# Patient Record
Sex: Female | Born: 1950 | Race: Black or African American | Hispanic: No | Marital: Single | Smoking: Never smoker
Health system: Southern US, Community
[De-identification: ages and names within clinical notes are randomized; demographics above are authoritative.]

## PROBLEM LIST (undated history)

## (undated) DIAGNOSIS — F419 Anxiety disorder, unspecified: Secondary | ICD-10-CM

## (undated) DIAGNOSIS — H269 Unspecified cataract: Secondary | ICD-10-CM

## (undated) DIAGNOSIS — R011 Cardiac murmur, unspecified: Secondary | ICD-10-CM

## (undated) DIAGNOSIS — I639 Cerebral infarction, unspecified: Secondary | ICD-10-CM

## (undated) DIAGNOSIS — E785 Hyperlipidemia, unspecified: Secondary | ICD-10-CM

## (undated) DIAGNOSIS — M109 Gout, unspecified: Secondary | ICD-10-CM

## (undated) DIAGNOSIS — I5032 Chronic diastolic (congestive) heart failure: Secondary | ICD-10-CM

## (undated) DIAGNOSIS — N189 Chronic kidney disease, unspecified: Secondary | ICD-10-CM

## (undated) DIAGNOSIS — Z9889 Other specified postprocedural states: Secondary | ICD-10-CM

## (undated) DIAGNOSIS — R112 Nausea with vomiting, unspecified: Secondary | ICD-10-CM

## (undated) DIAGNOSIS — M1990 Unspecified osteoarthritis, unspecified site: Secondary | ICD-10-CM

## (undated) DIAGNOSIS — G629 Polyneuropathy, unspecified: Secondary | ICD-10-CM

## (undated) DIAGNOSIS — T7840XA Allergy, unspecified, initial encounter: Secondary | ICD-10-CM

## (undated) DIAGNOSIS — I10 Essential (primary) hypertension: Secondary | ICD-10-CM

## (undated) HISTORY — DX: Chronic kidney disease, unspecified: N18.9

## (undated) HISTORY — PX: FRACTURE SURGERY: SHX138

## (undated) HISTORY — PX: CATARACT EXTRACTION, BILATERAL: SHX1313

## (undated) HISTORY — DX: Unspecified osteoarthritis, unspecified site: M19.90

## (undated) HISTORY — DX: Cerebral infarction, unspecified: I63.9

## (undated) HISTORY — PX: HEMORRHOID SURGERY: SHX153

## (undated) HISTORY — DX: Essential (primary) hypertension: I10

## (undated) HISTORY — PX: EYE SURGERY: SHX253

## (undated) HISTORY — DX: Allergy, unspecified, initial encounter: T78.40XA

## (undated) HISTORY — DX: Polyneuropathy, unspecified: G62.9

## (undated) HISTORY — PX: SPINE SURGERY: SHX786

## (undated) HISTORY — DX: Anxiety disorder, unspecified: F41.9

## (undated) HISTORY — DX: Unspecified cataract: H26.9

## (undated) HISTORY — PX: APPENDECTOMY: SHX54

## (undated) HISTORY — DX: Hyperlipidemia, unspecified: E78.5

## (undated) HISTORY — DX: Cardiac murmur, unspecified: R01.1

## (undated) HISTORY — PX: NECK SURGERY: SHX720

## (undated) HISTORY — PX: TUBAL LIGATION: SHX77

---

## 1998-11-15 ENCOUNTER — Other Ambulatory Visit: Admission: RE | Admit: 1998-11-15 | Discharge: 1998-11-15 | Payer: Self-pay | Admitting: Obstetrics & Gynecology

## 1999-06-29 ENCOUNTER — Emergency Department (HOSPITAL_COMMUNITY): Admission: EM | Admit: 1999-06-29 | Discharge: 1999-06-29 | Payer: Self-pay | Admitting: Emergency Medicine

## 1999-09-26 ENCOUNTER — Encounter: Admission: RE | Admit: 1999-09-26 | Discharge: 1999-09-26 | Payer: Self-pay | Admitting: Neurosurgery

## 1999-10-23 ENCOUNTER — Ambulatory Visit (HOSPITAL_COMMUNITY): Admission: RE | Admit: 1999-10-23 | Discharge: 1999-10-23 | Payer: Self-pay | Admitting: Neurosurgery

## 1999-10-23 ENCOUNTER — Encounter: Payer: Self-pay | Admitting: Neurosurgery

## 2000-02-09 ENCOUNTER — Emergency Department (HOSPITAL_COMMUNITY): Admission: EM | Admit: 2000-02-09 | Discharge: 2000-02-09 | Payer: Self-pay | Admitting: Emergency Medicine

## 2000-02-11 ENCOUNTER — Encounter: Admission: RE | Admit: 2000-02-11 | Discharge: 2000-05-11 | Payer: Self-pay | Admitting: Internal Medicine

## 2000-09-04 ENCOUNTER — Emergency Department (HOSPITAL_COMMUNITY): Admission: EM | Admit: 2000-09-04 | Discharge: 2000-09-04 | Payer: Self-pay | Admitting: *Deleted

## 2001-04-20 ENCOUNTER — Ambulatory Visit (HOSPITAL_COMMUNITY): Admission: RE | Admit: 2001-04-20 | Discharge: 2001-04-21 | Payer: Self-pay | Admitting: Ophthalmology

## 2001-04-20 ENCOUNTER — Encounter: Payer: Self-pay | Admitting: Ophthalmology

## 2001-09-08 ENCOUNTER — Ambulatory Visit (HOSPITAL_BASED_OUTPATIENT_CLINIC_OR_DEPARTMENT_OTHER): Admission: RE | Admit: 2001-09-08 | Discharge: 2001-09-08 | Payer: Self-pay | Admitting: Orthopedic Surgery

## 2002-01-27 ENCOUNTER — Encounter: Payer: Self-pay | Admitting: Internal Medicine

## 2002-04-29 ENCOUNTER — Encounter: Admission: RE | Admit: 2002-04-29 | Discharge: 2002-04-29 | Payer: Self-pay | Admitting: Internal Medicine

## 2002-05-13 ENCOUNTER — Encounter: Admission: RE | Admit: 2002-05-13 | Discharge: 2002-05-13 | Payer: Self-pay | Admitting: Internal Medicine

## 2002-05-27 ENCOUNTER — Encounter: Admission: RE | Admit: 2002-05-27 | Discharge: 2002-05-27 | Payer: Self-pay | Admitting: Internal Medicine

## 2002-07-02 ENCOUNTER — Encounter: Admission: RE | Admit: 2002-07-02 | Discharge: 2002-07-02 | Payer: Self-pay | Admitting: Internal Medicine

## 2002-07-02 ENCOUNTER — Ambulatory Visit (HOSPITAL_COMMUNITY): Admission: RE | Admit: 2002-07-02 | Discharge: 2002-07-02 | Payer: Self-pay | Admitting: Internal Medicine

## 2002-07-02 ENCOUNTER — Encounter: Payer: Self-pay | Admitting: Internal Medicine

## 2002-07-14 ENCOUNTER — Ambulatory Visit (HOSPITAL_COMMUNITY): Admission: RE | Admit: 2002-07-14 | Discharge: 2002-07-14 | Payer: Self-pay | Admitting: Internal Medicine

## 2002-07-14 ENCOUNTER — Encounter: Payer: Self-pay | Admitting: Cardiovascular Disease

## 2002-09-08 ENCOUNTER — Encounter: Payer: Self-pay | Admitting: Internal Medicine

## 2002-09-22 ENCOUNTER — Encounter: Payer: Self-pay | Admitting: Internal Medicine

## 2002-12-31 ENCOUNTER — Emergency Department (HOSPITAL_COMMUNITY): Admission: EM | Admit: 2002-12-31 | Discharge: 2002-12-31 | Payer: Self-pay | Admitting: Emergency Medicine

## 2002-12-31 ENCOUNTER — Encounter: Payer: Self-pay | Admitting: Emergency Medicine

## 2004-05-28 ENCOUNTER — Encounter: Payer: Self-pay | Admitting: Internal Medicine

## 2004-05-28 ENCOUNTER — Other Ambulatory Visit: Admission: RE | Admit: 2004-05-28 | Discharge: 2004-05-28 | Payer: Self-pay | Admitting: Internal Medicine

## 2004-07-09 ENCOUNTER — Emergency Department (HOSPITAL_COMMUNITY): Admission: EM | Admit: 2004-07-09 | Discharge: 2004-07-09 | Payer: Self-pay | Admitting: Family Medicine

## 2004-11-07 ENCOUNTER — Ambulatory Visit: Payer: Self-pay | Admitting: Internal Medicine

## 2004-12-29 ENCOUNTER — Emergency Department (HOSPITAL_COMMUNITY): Admission: EM | Admit: 2004-12-29 | Discharge: 2004-12-30 | Payer: Self-pay | Admitting: Emergency Medicine

## 2004-12-31 ENCOUNTER — Ambulatory Visit: Payer: Self-pay | Admitting: Internal Medicine

## 2005-01-11 ENCOUNTER — Ambulatory Visit: Payer: Self-pay | Admitting: Internal Medicine

## 2005-02-05 ENCOUNTER — Ambulatory Visit: Payer: Self-pay | Admitting: Internal Medicine

## 2005-05-06 ENCOUNTER — Ambulatory Visit: Payer: Self-pay | Admitting: Internal Medicine

## 2005-06-03 ENCOUNTER — Ambulatory Visit: Payer: Self-pay | Admitting: Internal Medicine

## 2005-07-25 ENCOUNTER — Ambulatory Visit: Payer: Self-pay | Admitting: Internal Medicine

## 2005-08-06 ENCOUNTER — Ambulatory Visit: Payer: Self-pay | Admitting: Internal Medicine

## 2005-10-28 ENCOUNTER — Ambulatory Visit: Payer: Self-pay | Admitting: Internal Medicine

## 2005-11-04 ENCOUNTER — Ambulatory Visit: Payer: Self-pay | Admitting: Internal Medicine

## 2005-12-12 ENCOUNTER — Ambulatory Visit: Payer: Self-pay | Admitting: Internal Medicine

## 2006-01-06 ENCOUNTER — Encounter: Admission: RE | Admit: 2006-01-06 | Discharge: 2006-01-06 | Payer: Self-pay | Admitting: Sports Medicine

## 2006-01-06 ENCOUNTER — Ambulatory Visit: Payer: Self-pay | Admitting: Internal Medicine

## 2006-01-20 ENCOUNTER — Encounter: Admission: RE | Admit: 2006-01-20 | Discharge: 2006-01-20 | Payer: Self-pay | Admitting: Sports Medicine

## 2006-03-03 ENCOUNTER — Ambulatory Visit: Payer: Self-pay | Admitting: Internal Medicine

## 2006-04-14 ENCOUNTER — Ambulatory Visit: Payer: Self-pay | Admitting: Internal Medicine

## 2006-06-03 ENCOUNTER — Ambulatory Visit: Payer: Self-pay | Admitting: Internal Medicine

## 2006-07-29 ENCOUNTER — Emergency Department (HOSPITAL_COMMUNITY): Admission: EM | Admit: 2006-07-29 | Discharge: 2006-07-29 | Payer: Self-pay | Admitting: Emergency Medicine

## 2006-08-01 ENCOUNTER — Ambulatory Visit: Payer: Self-pay | Admitting: Internal Medicine

## 2006-08-11 ENCOUNTER — Ambulatory Visit: Payer: Self-pay | Admitting: Internal Medicine

## 2006-09-01 ENCOUNTER — Ambulatory Visit: Payer: Self-pay | Admitting: Internal Medicine

## 2006-12-22 ENCOUNTER — Ambulatory Visit: Payer: Self-pay | Admitting: Internal Medicine

## 2006-12-25 ENCOUNTER — Ambulatory Visit: Payer: Self-pay | Admitting: Internal Medicine

## 2007-01-13 ENCOUNTER — Ambulatory Visit: Payer: Self-pay | Admitting: Internal Medicine

## 2007-01-13 LAB — CONVERTED CEMR LAB
ALT: 20 units/L (ref 0–40)
Albumin: 3.6 g/dL (ref 3.5–5.2)
Bilirubin, Direct: 0.1 mg/dL (ref 0.0–0.3)
CO2: 26 meq/L (ref 19–32)
Chloride: 108 meq/L (ref 96–112)
Eosinophils Relative: 2.7 % (ref 0.0–5.0)
GFR calc Af Amer: 54 mL/min
GFR calc non Af Amer: 45 mL/min
Glucose, Bld: 162 mg/dL — ABNORMAL HIGH (ref 70–99)
HCT: 35.7 % — ABNORMAL LOW (ref 36.0–46.0)
Hemoglobin: 12.3 g/dL (ref 12.0–15.0)
Lymphocytes Relative: 40.3 % (ref 12.0–46.0)
Monocytes Relative: 7.8 % (ref 3.0–11.0)
Platelets: 287 10*3/uL (ref 150–400)
Potassium: 3.6 meq/L (ref 3.5–5.1)
RDW: 13.2 % (ref 11.5–14.6)
Sodium: 141 meq/L (ref 135–145)
Total Protein: 7.2 g/dL (ref 6.0–8.3)
WBC: 6.6 10*3/uL (ref 4.5–10.5)

## 2007-01-14 ENCOUNTER — Ambulatory Visit: Payer: Self-pay

## 2007-01-15 ENCOUNTER — Encounter: Admission: RE | Admit: 2007-01-15 | Discharge: 2007-01-15 | Payer: Self-pay | Admitting: Internal Medicine

## 2007-01-16 ENCOUNTER — Ambulatory Visit: Payer: Self-pay | Admitting: Internal Medicine

## 2007-01-20 ENCOUNTER — Encounter: Admission: RE | Admit: 2007-01-20 | Discharge: 2007-02-17 | Payer: Self-pay | Admitting: Internal Medicine

## 2007-02-02 ENCOUNTER — Ambulatory Visit: Payer: Self-pay | Admitting: Internal Medicine

## 2007-02-16 ENCOUNTER — Ambulatory Visit: Payer: Self-pay | Admitting: Internal Medicine

## 2007-03-23 ENCOUNTER — Ambulatory Visit: Payer: Self-pay | Admitting: Internal Medicine

## 2007-05-19 ENCOUNTER — Ambulatory Visit: Payer: Self-pay | Admitting: Internal Medicine

## 2007-05-22 ENCOUNTER — Encounter: Payer: Self-pay | Admitting: Internal Medicine

## 2007-05-22 DIAGNOSIS — E1129 Type 2 diabetes mellitus with other diabetic kidney complication: Secondary | ICD-10-CM

## 2007-05-22 DIAGNOSIS — I10 Essential (primary) hypertension: Secondary | ICD-10-CM

## 2007-05-22 DIAGNOSIS — E7800 Pure hypercholesterolemia, unspecified: Secondary | ICD-10-CM | POA: Insufficient documentation

## 2007-05-22 DIAGNOSIS — Z8679 Personal history of other diseases of the circulatory system: Secondary | ICD-10-CM

## 2007-05-22 DIAGNOSIS — Z9889 Other specified postprocedural states: Secondary | ICD-10-CM | POA: Insufficient documentation

## 2007-07-14 ENCOUNTER — Encounter: Payer: Self-pay | Admitting: Internal Medicine

## 2007-08-27 ENCOUNTER — Ambulatory Visit: Payer: Self-pay | Admitting: Internal Medicine

## 2007-08-27 LAB — CONVERTED CEMR LAB: Blood Glucose, Fingerstick: 316

## 2007-09-28 ENCOUNTER — Ambulatory Visit: Payer: Self-pay | Admitting: Internal Medicine

## 2007-11-02 ENCOUNTER — Encounter: Payer: Self-pay | Admitting: Internal Medicine

## 2007-12-18 ENCOUNTER — Telehealth: Payer: Self-pay | Admitting: Internal Medicine

## 2007-12-29 ENCOUNTER — Ambulatory Visit: Payer: Self-pay | Admitting: Internal Medicine

## 2007-12-29 LAB — CONVERTED CEMR LAB
Hgb A1c MFr Bld: 9.1 % — ABNORMAL HIGH (ref 4.6–6.0)
Microalb Creat Ratio: 596.4 mg/g — ABNORMAL HIGH (ref 0.0–30.0)

## 2008-02-29 ENCOUNTER — Telehealth: Payer: Self-pay | Admitting: Internal Medicine

## 2008-03-30 ENCOUNTER — Telehealth: Payer: Self-pay | Admitting: Internal Medicine

## 2008-04-01 ENCOUNTER — Ambulatory Visit: Payer: Self-pay | Admitting: Internal Medicine

## 2008-04-01 LAB — CONVERTED CEMR LAB
BUN: 30 mg/dL — ABNORMAL HIGH (ref 6–23)
CO2: 22 meq/L (ref 19–32)
Calcium: 9.1 mg/dL (ref 8.4–10.5)
Creatinine, Ser: 1.59 mg/dL — ABNORMAL HIGH (ref 0.40–1.20)
Glucose, Bld: 54 mg/dL — ABNORMAL LOW (ref 70–99)
Potassium: 4.3 meq/L (ref 3.5–5.3)
Sodium: 141 meq/L (ref 135–145)

## 2008-07-01 ENCOUNTER — Ambulatory Visit: Payer: Self-pay | Admitting: Internal Medicine

## 2008-07-01 DIAGNOSIS — F411 Generalized anxiety disorder: Secondary | ICD-10-CM | POA: Insufficient documentation

## 2008-07-05 LAB — CONVERTED CEMR LAB
ALT: 21 units/L (ref 0–35)
BUN: 25 mg/dL — ABNORMAL HIGH (ref 6–23)
Bilirubin, Direct: 0.1 mg/dL (ref 0.0–0.3)
Creatinine, Ser: 1.7 mg/dL — ABNORMAL HIGH (ref 0.4–1.2)
Direct LDL: 268.8 mg/dL
GFR calc non Af Amer: 33 mL/min
HCT: 36.8 % (ref 36.0–46.0)
HDL: 31.5 mg/dL — ABNORMAL LOW (ref >39.0)
Hemoglobin: 12.1 g/dL (ref 12.0–15.0)
Hgb A1c MFr Bld: 9.2 % — ABNORMAL HIGH (ref 4.6–6.0)
Platelets: 298 10*3/uL (ref 150–400)
Potassium: 3.6 meq/L (ref 3.5–5.1)
Total Bilirubin: 1 mg/dL (ref 0.3–1.2)
Total CHOL/HDL Ratio: 12.3

## 2008-08-18 ENCOUNTER — Encounter: Payer: Self-pay | Admitting: Internal Medicine

## 2008-09-27 ENCOUNTER — Ambulatory Visit: Payer: Self-pay | Admitting: Internal Medicine

## 2008-09-27 DIAGNOSIS — J069 Acute upper respiratory infection, unspecified: Secondary | ICD-10-CM | POA: Insufficient documentation

## 2008-09-27 LAB — CONVERTED CEMR LAB
ALT: 18 units/L (ref 0–35)
AST: 28 units/L (ref 0–37)
Albumin: 3.5 g/dL (ref 3.5–5.2)
Alkaline Phosphatase: 57 units/L (ref 39–117)
Bilirubin, Direct: 0.1 mg/dL (ref 0.0–0.3)
Chloride: 106 meq/L (ref 96–112)
HCT: 35.6 % — ABNORMAL LOW (ref 36.0–46.0)
Hgb A1c MFr Bld: 8.8 % — ABNORMAL HIGH (ref 4.6–6.0)
MCHC: 33.8 g/dL (ref 30.0–36.0)
MCV: 81.8 fL (ref 78.0–100.0)
Platelets: 234 10*3/uL (ref 150–400)
Potassium: 3.4 meq/L — ABNORMAL LOW (ref 3.5–5.1)
Sodium: 143 meq/L (ref 135–145)
Total Protein: 7.5 g/dL (ref 6.0–8.3)
WBC: 5.7 10*3/uL (ref 4.5–10.5)

## 2009-02-27 ENCOUNTER — Ambulatory Visit: Payer: Self-pay | Admitting: Internal Medicine

## 2009-02-27 LAB — CONVERTED CEMR LAB
Calcium: 9 mg/dL (ref 8.4–10.5)
Chloride: 108 meq/L (ref 96–112)
Glucose, Bld: 64 mg/dL — ABNORMAL LOW (ref 70–99)
Phosphorus: 3.7 mg/dL (ref 2.3–4.6)
Potassium: 3.7 meq/L (ref 3.5–5.1)
Sodium: 143 meq/L (ref 135–145)

## 2009-02-28 ENCOUNTER — Encounter: Payer: Self-pay | Admitting: Internal Medicine

## 2009-03-30 ENCOUNTER — Ambulatory Visit: Payer: Self-pay | Admitting: Internal Medicine

## 2009-04-27 ENCOUNTER — Encounter: Payer: Self-pay | Admitting: Internal Medicine

## 2009-06-29 ENCOUNTER — Ambulatory Visit: Payer: Self-pay | Admitting: Internal Medicine

## 2009-06-29 DIAGNOSIS — N289 Disorder of kidney and ureter, unspecified: Secondary | ICD-10-CM | POA: Insufficient documentation

## 2009-07-03 LAB — CONVERTED CEMR LAB
AST: 20 units/L (ref 0–37)
BUN: 32 mg/dL — ABNORMAL HIGH (ref 6–23)
CO2: 26 meq/L (ref 19–32)
Calcium: 9.4 mg/dL (ref 8.4–10.5)
Chloride: 110 meq/L (ref 96–112)
Cholesterol: 428 mg/dL — ABNORMAL HIGH (ref 0–200)
Creatinine, Ser: 2.4 mg/dL — ABNORMAL HIGH (ref 0.4–1.2)
GFR calc non Af Amer: 26.58 mL/min (ref >60)
Glucose, Bld: 51 mg/dL — ABNORMAL LOW (ref 70–99)
Hgb A1c MFr Bld: 8.5 % — ABNORMAL HIGH (ref 4.6–6.5)

## 2009-09-29 ENCOUNTER — Ambulatory Visit: Payer: Self-pay | Admitting: Internal Medicine

## 2009-10-02 LAB — CONVERTED CEMR LAB
Chloride: 106 meq/L (ref 96–112)
GFR calc non Af Amer: 37.01 mL/min (ref >60)
Phosphorus: 4.6 mg/dL (ref 2.3–4.6)

## 2010-01-01 ENCOUNTER — Ambulatory Visit: Payer: Self-pay | Admitting: Internal Medicine

## 2010-01-01 LAB — CONVERTED CEMR LAB
BUN: 22 mg/dL (ref 6–23)
CO2: 25 meq/L (ref 19–32)
Chloride: 107 meq/L (ref 96–112)
Glucose, Bld: 143 mg/dL — ABNORMAL HIGH (ref 70–99)
Hgb A1c MFr Bld: 7.9 % — ABNORMAL HIGH (ref 4.6–6.5)
Potassium: 4.2 meq/L (ref 3.5–5.1)

## 2010-02-23 ENCOUNTER — Encounter: Payer: Self-pay | Admitting: Internal Medicine

## 2010-03-28 ENCOUNTER — Ambulatory Visit: Payer: Self-pay | Admitting: Internal Medicine

## 2010-03-28 DIAGNOSIS — M6580 Other synovitis and tenosynovitis, unspecified site: Secondary | ICD-10-CM

## 2010-03-31 ENCOUNTER — Emergency Department (HOSPITAL_COMMUNITY): Admission: EM | Admit: 2010-03-31 | Discharge: 2010-04-01 | Payer: Self-pay | Admitting: Emergency Medicine

## 2010-04-02 ENCOUNTER — Ambulatory Visit: Payer: Self-pay | Admitting: Internal Medicine

## 2010-04-02 DIAGNOSIS — M7020 Olecranon bursitis, unspecified elbow: Secondary | ICD-10-CM

## 2010-06-04 ENCOUNTER — Ambulatory Visit: Payer: Self-pay | Admitting: Internal Medicine

## 2010-06-04 LAB — CONVERTED CEMR LAB
Calcium: 8.8 mg/dL (ref 8.4–10.5)
Chloride: 112 meq/L (ref 96–112)
Creatinine, Ser: 1.6 mg/dL — ABNORMAL HIGH (ref 0.4–1.2)
Glucose, Bld: 124 mg/dL — ABNORMAL HIGH (ref 70–99)

## 2010-07-12 ENCOUNTER — Telehealth: Payer: Self-pay | Admitting: Internal Medicine

## 2010-09-03 ENCOUNTER — Ambulatory Visit: Payer: Self-pay | Admitting: Internal Medicine

## 2010-09-03 LAB — CONVERTED CEMR LAB: Hgb A1c MFr Bld: 8.7 % — ABNORMAL HIGH (ref 4.6–6.5)

## 2010-09-03 LAB — HM DIABETES EYE EXAM

## 2010-11-20 ENCOUNTER — Telehealth: Payer: Self-pay | Admitting: Internal Medicine

## 2010-12-03 ENCOUNTER — Ambulatory Visit: Payer: Self-pay | Admitting: Internal Medicine

## 2010-12-03 LAB — CONVERTED CEMR LAB: Hgb A1c MFr Bld: 8.3 % — ABNORMAL HIGH (ref 4.6–6.5)

## 2010-12-04 ENCOUNTER — Telehealth: Payer: Self-pay | Admitting: Internal Medicine

## 2010-12-24 ENCOUNTER — Telehealth: Payer: Self-pay | Admitting: Internal Medicine

## 2011-01-15 NOTE — Assessment & Plan Note (Signed)
Summary: fu on hand pain/njr   Vital Signs:  Patient profile:   60 year old female Weight:      176 pounds Temp:     97.9 degrees F oral BP sitting:   132 / 80  (left arm) Cuff size:   regular  Vitals Entered By: Duard Brady LPN (April 02, 2010 9:57 AM) CC: f/u hand and shoulder pain last week - seen in [facility] on sautrday - dx bursitis and infection in shoulder      fbs 140 Is Patient Diabetic? Yes Did you bring your meter with you today? No   CC:  f/u hand and shoulder pain last week - seen in [facility] on sautrday - dx bursitis and infection in shoulder      fbs 140.  History of Present Illness: 34 patient, who is seen today in follow-up.  She was seen two days ago in the emergency room due to a worsening right elbow pain.  It was felt that she had a bursitis and she was prescribed doxycycline and a cephalosporin.  She has improved, but has not taken a cephalosporin due to a prior history of allergy to Ceclor.  Denies any fever, chills, or other constitutional complaints.  She has some residual swelling and erythema of all of her right lateral elbow, but her pain has largely resolved.  She has diabetes and hypertension  Allergies: 1)  Tetanus Toxoid Adsorbed (Tetanus Toxoid Adsorbed) 2)  Cefaclor (Cefaclor)  Past History:  Past Medical History: Reviewed history from 06/29/2009 and no changes required. Diabetes mellitus, type II Hyperlipidemia Hypertension Cerebrovascular accident, hx of right paramedian pons infarct, January 2008 systolic murmur Anxiety peripheral neuropathy microalbuminuria history retinal detachment chronic kidney disease  Review of Systems  The patient denies anorexia, fever, weight loss, weight gain, vision loss, decreased hearing, hoarseness, chest pain, syncope, dyspnea on exertion, peripheral edema, prolonged cough, headaches, hemoptysis, abdominal pain, melena, hematochezia, severe indigestion/heartburn, hematuria, incontinence,  genital sores, muscle weakness, suspicious skin lesions, transient blindness, difficulty walking, depression, unusual weight change, abnormal bleeding, enlarged lymph nodes, angioedema, and breast masses.    Physical Exam  General:  overweight-appearing.  blood pressure 130/80overweight-appearing.   Msk:  there is slight, erythema, and mild soft tissue swelling involving her right lateral elbow.  There is very  little pain and she had full range of motion involving the elbow   Impression & Recommendations:  Problem # 1:  BURSITIS, ELBOW (ICD-726.33) will DC the cephalexin, and substitute Avelox for 7 days  Problem # 2:  HYPERTENSION (ICD-401.9)  Her updated medication list for this problem includes:    Lotrel 5-20 Mg Caps (Amlodipine besy-benazepril hcl) .Marland Kitchen... Take 2 capsule by mouth once a day  Her updated medication list for this problem includes:    Lotrel 5-20 Mg Caps (Amlodipine besy-benazepril hcl) .Marland Kitchen... Take 2 capsule by mouth once a day  Problem # 3:  DIABETES MELLITUS, TYPE II (ICD-250.00)  Her updated medication list for this problem includes:    Bayer Aspirin 325 Mg Tabs (Aspirin) .Marland Kitchen... Take 1 tablet by mouth once a day    Lotrel 5-20 Mg Caps (Amlodipine besy-benazepril hcl) .Marland Kitchen... Take 2 capsule by mouth once a day    Lantus Solostar 100 Unit/ml Soln (Insulin glargine) .Marland Kitchen... 26 units at bedtime    Humalog Kwikpen 100 Unit/ml Soln (Insulin lispro (human)) .Marland KitchenMarland KitchenMarland KitchenMarland Kitchen 10-16 units prior to meals  Her updated medication list for this problem includes:    Bayer Aspirin 325 Mg  Tabs (Aspirin) .Marland Kitchen... Take 1 tablet by mouth once a day    Lotrel 5-20 Mg Caps (Amlodipine besy-benazepril hcl) .Marland Kitchen... Take 2 capsule by mouth once a day    Lantus Solostar 100 Unit/ml Soln (Insulin glargine) .Marland Kitchen... 26 units at bedtime    Humalog Kwikpen 100 Unit/ml Soln (Insulin lispro (human)) .Marland KitchenMarland KitchenMarland KitchenMarland Kitchen 10-16 units prior to meals  Complete Medication List: 1)  Bayer Aspirin 325 Mg Tabs (Aspirin) .... Take 1 tablet  by mouth once a day 2)  Klor-con M20 20 Meq Tbcr (Potassium chloride crys cr) .Marland Kitchen.. 1 two times a day 3)  Lipitor 80 Mg Tabs (Atorvastatin calcium) .Marland Kitchen.. 1 tablet by mouth once a day 4)  Lotrel 5-20 Mg Caps (Amlodipine besy-benazepril hcl) .... Take 2 capsule by mouth once a day 5)  Lantus Solostar 100 Unit/ml Soln (Insulin glargine) .... 26 units at bedtime 6)  Humalog Kwikpen 100 Unit/ml Soln (Insulin lispro (human)) .Marland Kitchen.. 10-16 units prior to meals 7)  Flexeril 10 Mg Tabs (Cyclobenzaprine hcl) .Marland Kitchen.. 1 three times a day as needed 8)  Novofine 30g X 8 Mm Misc (Insulin pen needle) .... Use three times a day 9)  Hydromet 5-1.5 Mg/61ml Syrp (Hydrocodone-homatropine) .Marland Kitchen.. 1 teaspoon every 6 hours as needed for cough 10)  Vicodin Es 7.5-750 Mg Tabs (Hydrocodone-acetaminophen) 11)  Doxycycline Hyclate 100 Mg Caps (Doxycycline hyclate) .... Two times a day x 7 days 12)  Oxycodone-acetaminophen 5-325 Mg Tabs (Oxycodone-acetaminophen) .... Prn 13)  Avelox 400 Mg Tabs (Moxifloxacin hcl) .... One daily  Patient Instructions: 1)  Please schedule a follow-up appointment in 2 months. 2)  Limit your Sodium (Salt). 3)  It is important that you exercise regularly at least 20 minutes 5 times a week. If you develop chest pain, have severe difficulty breathing, or feel very tired , stop exercising immediately and seek medical attention. 4)  You need to lose weight. Consider a lower calorie diet and regular exercise.  5)  Check your blood sugars regularly. If your readings are usually above : or below 70 you should contact our office. 6)  It is important that your Diabetic A1c level is checked every 3 months. 7)  See your eye doctor yearly to check for diabetic eye damage.

## 2011-01-15 NOTE — Assessment & Plan Note (Signed)
Summary: 2 month rov/njr   Vital Signs:  Patient profile:   60 year old female Weight:      176 pounds Temp:     97.6 degrees F oral BP sitting:   170 / 80  (left arm)  Vitals Entered By: Kathrynn Speed CMA (June 04, 2010 10:45 AM) CC: 2 mth rov    CC:  2 mth rov .  History of Present Illness: 60 year old patient who has a history of type 2 diabetes.  She has dyslipidemia  and treated  hypertension.  unless hemoglobin A1c7.8.  She is on basal and mealtime insulin with sliding scale coverage.  There have been some compliance issues in the past.  She states that she has resumed taking her Lipitor.  She has cerebrovascular disease and mild chronic renal disease.  Denies any hypoglycemic symptoms.  She states a fasting blood sugar this morning 103.  Over the the past few months.  Her weight is unchanged.   Current Medications (verified): 1)  Bayer Aspirin 325 Mg Tabs (Aspirin) .... Take 1 Tablet By Mouth Once A Day 2)  Klor-Con M20 20 Meq Tbcr (Potassium Chloride Crys Cr) .Marland Kitchen.. 1 Two Times A Day 3)  Lipitor 80 Mg Tabs (Atorvastatin Calcium) .Marland Kitchen.. 1 Tablet By Mouth Once A Day 4)  Lotrel 5-20 Mg Caps (Amlodipine Besy-Benazepril Hcl) .... Take 2 Capsule By Mouth Once A Day 5)  Lantus Solostar 100 Unit/ml  Soln (Insulin Glargine) .... 26 Units At Bedtime 6)  Humalog Kwikpen 100 Unit/ml  Soln (Insulin Lispro (Human)) .Marland Kitchen.. 10-16 Units Prior To Meals 7)  Flexeril 10 Mg  Tabs (Cyclobenzaprine Hcl) .Marland Kitchen.. 1 Three Times A Day As Needed 8)  Novofine 30g X 8 Mm Misc (Insulin Pen Needle) .... Use Three Times A Day 9)  Hydromet 5-1.5 Mg/109ml Syrp (Hydrocodone-Homatropine) .Marland Kitchen.. 1 Teaspoon Every 6 Hours As Needed For Cough 10)  Vicodin Es 7.5-750 Mg Tabs (Hydrocodone-Acetaminophen) 11)  Doxycycline Hyclate 100 Mg Caps (Doxycycline Hyclate) .... Two Times A Day X 7 Days 12)  Oxycodone-Acetaminophen 5-325 Mg Tabs (Oxycodone-Acetaminophen) .... Prn 13)  Avelox 400 Mg Tabs (Moxifloxacin Hcl) .... One  Daily  Allergies (verified): 1)  Tetanus Toxoid Adsorbed (Tetanus Toxoid Adsorbed) 2)  Cefaclor (Cefaclor)  Past History:  Past Medical History: Reviewed history from 06/29/2009 and no changes required. Diabetes mellitus, type II Hyperlipidemia Hypertension Cerebrovascular accident, hx of right paramedian pons infarct, January 2008 systolic murmur Anxiety peripheral neuropathy microalbuminuria history retinal detachment chronic kidney disease  Review of Systems  The patient denies anorexia, fever, weight loss, weight gain, vision loss, decreased hearing, hoarseness, chest pain, syncope, dyspnea on exertion, peripheral edema, prolonged cough, headaches, hemoptysis, abdominal pain, melena, hematochezia, severe indigestion/heartburn, hematuria, incontinence, genital sores, muscle weakness, suspicious skin lesions, transient blindness, difficulty walking, depression, unusual weight change, abnormal bleeding, enlarged lymph nodes, angioedema, and breast masses.    Physical Exam  General:  overweight-appearing.  140/80overweight-appearing.   Head:  Normocephalic and atraumatic without obvious abnormalities. No apparent alopecia or balding. Eyes:  No corneal or conjunctival inflammation noted. EOMI. Perrla. Funduscopic exam benign, without hemorrhages, exudates or papilledema. Vision grossly normal. Mouth:  Oral mucosa and oropharynx without lesions or exudates.  Teeth in good repair. Neck:  No deformities, masses, or tenderness noted. Lungs:  Normal respiratory effort, chest expands symmetrically. Lungs are clear to auscultation, no crackles or wheezes. Heart:  Normal rate and regular rhythm. S1 and S2 normal without gallop, murmur, click, rub or other extra sounds. Abdomen:  Bowel sounds  positive,abdomen soft and non-tender without masses, organomegaly or hernias noted. Msk:  No deformity or scoliosis noted of thoracic or lumbar spine.    Diabetes Management Exam:    Foot Exam (with  socks and/or shoes not present):       Sensory-Pinprick/Light touch:          Left medial foot (L-4): normal          Left dorsal foot (L-5): normal          Left lateral foot (S-1): normal          Right medial foot (L-4): normal          Right dorsal foot (L-5): normal          Right lateral foot (S-1): normal       Sensory-Monofilament:          Left foot: normal          Right foot: normal       Inspection:          Left foot: normal          Right foot: normal       Nails:          Left foot: normal          Right foot: normal    Foot Exam by Podiatrist:       Date: 06/04/2010       Results: no diabetic findings       Done by: PCP   Impression & Recommendations:  Problem # 1:  RENAL DISEASE, CHRONIC (ICD-593.9)  will check a Bmet   Orders: Venipuncture (04540) TLB-BMP (Basic Metabolic Panel-BMET) (80048-METABOL)  Problem # 2:  HYPERTENSION (ICD-401.9)  Her updated medication list for this problem includes:    Lotrel 5-20 Mg Caps (Amlodipine besy-benazepril hcl) .Marland Kitchen... Take 2 capsule by mouth once a day  Her updated medication list for this problem includes:    Lotrel 5-20 Mg Caps (Amlodipine besy-benazepril hcl) .Marland Kitchen... Take 2 capsule by mouth once a day  Problem # 3:  HYPERLIPIDEMIA (ICD-272.4)  Her updated medication list for this problem includes:    Lipitor 80 Mg Tabs (Atorvastatin calcium) .Marland Kitchen... 1 tablet by mouth once a day  Her updated medication list for this problem includes:    Lipitor 80 Mg Tabs (Atorvastatin calcium) .Marland Kitchen... 1 tablet by mouth once a day  Problem # 4:  DIABETES MELLITUS, TYPE II (ICD-250.00)  Her updated medication list for this problem includes:    Bayer Aspirin 325 Mg Tabs (Aspirin) .Marland Kitchen... Take 1 tablet by mouth once a day    Lotrel 5-20 Mg Caps (Amlodipine besy-benazepril hcl) .Marland Kitchen... Take 2 capsule by mouth once a day    Lantus Solostar 100 Unit/ml Soln (Insulin glargine) .Marland Kitchen... 26 units at bedtime    Humalog Kwikpen 100 Unit/ml Soln  (Insulin lispro (human)) .Marland KitchenMarland KitchenMarland KitchenMarland Kitchen 10-16 units prior to meals  Her updated medication list for this problem includes:    Bayer Aspirin 325 Mg Tabs (Aspirin) .Marland Kitchen... Take 1 tablet by mouth once a day    Lotrel 5-20 Mg Caps (Amlodipine besy-benazepril hcl) .Marland Kitchen... Take 2 capsule by mouth once a day    Lantus Solostar 100 Unit/ml Soln (Insulin glargine) .Marland Kitchen... 26 units at bedtime    Humalog Kwikpen 100 Unit/ml Soln (Insulin lispro (human)) .Marland KitchenMarland KitchenMarland KitchenMarland Kitchen 10-16 units prior to meals  Complete Medication List: 1)  Bayer Aspirin 325 Mg Tabs (Aspirin) .... Take 1 tablet by mouth once a day 2)  Klor-con M20 20 Meq Tbcr (Potassium chloride crys cr) .Marland Kitchen.. 1 two times a day 3)  Lipitor 80 Mg Tabs (Atorvastatin calcium) .Marland Kitchen.. 1 tablet by mouth once a day 4)  Lotrel 5-20 Mg Caps (Amlodipine besy-benazepril hcl) .... Take 2 capsule by mouth once a day 5)  Lantus Solostar 100 Unit/ml Soln (Insulin glargine) .... 26 units at bedtime 6)  Humalog Kwikpen 100 Unit/ml Soln (Insulin lispro (human)) .Marland Kitchen.. 10-16 units prior to meals 7)  Flexeril 10 Mg Tabs (Cyclobenzaprine hcl) .Marland Kitchen.. 1 three times a day as needed 8)  Novofine 30g X 8 Mm Misc (Insulin pen needle) .... Use three times a day 9)  Hydromet 5-1.5 Mg/53ml Syrp (Hydrocodone-homatropine) .Marland Kitchen.. 1 teaspoon every 6 hours as needed for cough 10)  Vicodin Es 7.5-750 Mg Tabs (Hydrocodone-acetaminophen)  Other Orders: TLB-Cholesterol, Total (82465-CHO) TLB-A1C / Hgb A1C (Glycohemoglobin) (83036-A1C)  Patient Instructions: 1)  Please schedule a follow-up appointment in 3 months. 2)  Limit your Sodium (Salt). 3)  It is important that you exercise regularly at least 20 minutes 5 times a week. If you develop chest pain, have severe difficulty breathing, or feel very tired , stop exercising immediately and seek medical attention. 4)  You need to lose weight. Consider a lower calorie diet and regular exercise.  5)  Check your blood sugars regularly. If your readings are usually above : or  below 70 you should contact our office. 6)  It is important that your Diabetic A1c level is checked every 3 months. 7)  See your eye doctor yearly to check for diabetic eye damage.

## 2011-01-15 NOTE — Assessment & Plan Note (Signed)
Summary: 3 MONTH FUP//CCM   Vital Signs:  Patient profile:   60 year old female Weight:      175 pounds Temp:     97.6 degrees F oral  Vitals Entered By: Duard Brady LPN (September 03, 2010 10:28 AM) CC: 3 mos rov -   doing ok      fbs 165 Is Patient Diabetic? Yes Did you bring your meter with you today? No Flu Vaccine Consent Questions     Do you have a history of severe allergic reactions to this vaccine? no    Any prior history of allergic reactions to egg and/or gelatin? no    Do you have a sensitivity to the preservative Thimersol? no    Do you have a past history of Guillan-Barre Syndrome? no    Do you currently have an acute febrile illness? no    Have you ever had a severe reaction to latex? no    Vaccine information given and explained to patient? yes    Are you currently pregnant? no    Lot Number:[id number]   Exp Date:06/15/2011   Site Given  Left Deltoid IM   CC:  3 mos rov -   doing ok      fbs 165.  History of Present Illness:  60 year old patient who is in today for follow-up.  She has a history of dyslipidemia.  She states she is not taking Lipitor due to nausea.  She has been on this medication for some time and there has been some compliance issues.  Her last cholesterol was approaching 400.  She is basal and mealtime insulin.  She has rare hypoglycemia.  She has treated hypertension and a history of Struble vascular disease.  Her chief complaint today is pain about her left heel and lateral foot.  Denies any cardiovascular symptoms.  Allergies: 1)  Tetanus Toxoid Adsorbed (Tetanus Toxoid Adsorbed) 2)  Cefaclor (Cefaclor)  Past History:  Past Medical History: Reviewed history from 06/29/2009 and no changes required. Diabetes mellitus, type II Hyperlipidemia Hypertension Cerebrovascular accident, hx of right paramedian pons infarct, January 2008 systolic murmur Anxiety peripheral neuropathy microalbuminuria history retinal detachment chronic  kidney disease  Past Surgical History: Reviewed history from 07/01/2008 and no changes required. Hemorrhoidectomy Tubal ligation status post multiple arthroscopies in the shoulders and elbows C-spine fusion 1997  2-D echocardiogram July 2003  Review of Systems       The patient complains of difficulty walking.  The patient denies anorexia, fever, weight loss, weight gain, vision loss, decreased hearing, hoarseness, chest pain, syncope, dyspnea on exertion, peripheral edema, prolonged cough, headaches, hemoptysis, abdominal pain, melena, hematochezia, severe indigestion/heartburn, hematuria, incontinence, genital sores, muscle weakness, suspicious skin lesions, transient blindness, depression, unusual weight change, abnormal bleeding, enlarged lymph nodes, angioedema, and breast masses.    Physical Exam  General:  overweight-appearing.  140/80overweight-appearing.   Head:  Normocephalic and atraumatic without obvious abnormalities. No apparent alopecia or balding. Eyes:  No corneal or conjunctival inflammation noted. EOMI. Perrla. Funduscopic exam benign, without hemorrhages, exudates or papilledema. Vision grossly normal. Mouth:  Oral mucosa and oropharynx without lesions or exudates.  Teeth in good repair. Neck:  No deformities, masses, or tenderness noted. Lungs:  Normal respiratory effort, chest expands symmetrically. Lungs are clear to auscultation, no crackles or wheezes. Heart:  grade 3/6 systolic murmur Abdomen:  Bowel sounds positive,abdomen soft and non-tender without masses, organomegaly or hernias noted. Msk:  slight tenderness over the left lateral heel and left lateral foot.  No  inflammatory changes Extremities:  No clubbing, cyanosis, edema, or deformity noted with normal full range of motion of all joints.    Diabetes Management Exam:    Foot Exam (with socks and/or shoes not present):       Sensory-Pinprick/Light touch:          Left medial foot (L-4): diminished           Left dorsal foot (L-5): diminished          Left lateral foot (S-1): diminished          Right medial foot (L-4): diminished          Right dorsal foot (L-5): diminished          Right lateral foot (S-1): diminished       Sensory-Monofilament:          Left foot: diminished          Right foot: diminished       Inspection:          Left foot: normal          Right foot: normal       Nails:          Left foot: normal          Right foot: normal    Foot Exam by Podiatrist:       Date: 09/03/2010       Results: mild diabetic findings       Done by: PCP    Eye Exam:       Eye Exam done here today          Results: normal   Impression & Recommendations:  Problem # 1:  RENAL DISEASE, CHRONIC (ICD-593.9)  Problem # 2:  HYPERTENSION (ICD-401.9)  Her updated medication list for this problem includes:    Lotrel 5-20 Mg Caps (Amlodipine besy-benazepril hcl) .Marland Kitchen... Take 2 capsule by mouth once a day  Her updated medication list for this problem includes:    Lotrel 5-20 Mg Caps (Amlodipine besy-benazepril hcl) .Marland Kitchen... Take 2 capsule by mouth once a day  Problem # 3:  CEREBROVASCULAR ACCIDENT, HX OF (ICD-V12.50)  Problem # 4:  HYPERLIPIDEMIA (ICD-272.4)  The following medications were removed from the medication list:    Lipitor 80 Mg Tabs (Atorvastatin calcium) .Marland Kitchen... 1 tablet by mouth once a day Her updated medication list for this problem includes:    Crestor 20 Mg Tabs (Rosuvastatin calcium) ..... One daily  The following medications were removed from the medication list:    Lipitor 80 Mg Tabs (Atorvastatin calcium) .Marland Kitchen... 1 tablet by mouth once a day Her updated medication list for this problem includes:    Crestor 20 Mg Tabs (Rosuvastatin calcium) ..... One daily  Problem # 5:  DIABETES MELLITUS, TYPE II (ICD-250.00)  Her updated medication list for this problem includes:    Bayer Aspirin 325 Mg Tabs (Aspirin) .Marland Kitchen... Take 1 tablet by mouth once a day    Lotrel 5-20 Mg Caps  (Amlodipine besy-benazepril hcl) .Marland Kitchen... Take 2 capsule by mouth once a day    Lantus Solostar 100 Unit/ml Soln (Insulin glargine) .Marland Kitchen... 26 units at bedtime    Humalog Kwikpen 100 Unit/ml Soln (Insulin lispro (human)) .Marland KitchenMarland KitchenMarland KitchenMarland Kitchen 10-16 units prior to meals    Her updated medication list for this problem includes:    Bayer Aspirin 325 Mg Tabs (Aspirin) .Marland Kitchen... Take 1 tablet by mouth once a day    Lotrel 5-20 Mg Caps (Amlodipine besy-benazepril hcl) .Marland KitchenMarland KitchenMarland KitchenMarland Kitchen  Take 2 capsule by mouth once a day    Lantus Solostar 100 Unit/ml Soln (Insulin glargine) .Marland Kitchen... 26 units at bedtime    Humalog Kwikpen 100 Unit/ml Soln (Insulin lispro (human)) .Marland KitchenMarland KitchenMarland KitchenMarland Kitchen 10-16 units prior to meals  Orders: Venipuncture (16109) TLB-A1C / Hgb A1C (Glycohemoglobin) (83036-A1C) Specimen Handling (60454)  Complete Medication List: 1)  Bayer Aspirin 325 Mg Tabs (Aspirin) .... Take 1 tablet by mouth once a day 2)  Klor-con M20 20 Meq Tbcr (Potassium chloride crys cr) .Marland Kitchen.. 1 two times a day 3)  Lotrel 5-20 Mg Caps (Amlodipine besy-benazepril hcl) .... Take 2 capsule by mouth once a day 4)  Lantus Solostar 100 Unit/ml Soln (Insulin glargine) .... 26 units at bedtime 5)  Humalog Kwikpen 100 Unit/ml Soln (Insulin lispro (human)) .Marland Kitchen.. 10-16 units prior to meals 6)  Flexeril 10 Mg Tabs (Cyclobenzaprine hcl) .Marland Kitchen.. 1 three times a day as needed 7)  Novofine 30g X 8 Mm Misc (Insulin pen needle) .... Use three times a day 8)  Vicodin Es 7.5-750 Mg Tabs (Hydrocodone-acetaminophen) .Marland Kitchen.. 1 by mouth q6hrs as needed pain 9)  Crestor 20 Mg Tabs (Rosuvastatin calcium) .... One daily  Other Orders: Admin 1st Vaccine (09811) Flu Vaccine 21yrs + (91478)  Patient Instructions: 1)  Please schedule a follow-up appointment in 3 months. 2)  Limit your Sodium (Salt). 3)  It is important that you exercise regularly at least 20 minutes 5 times a week. If you develop chest pain, have severe difficulty breathing, or feel very tired , stop exercising immediately and  seek medical attention. 4)  You need to lose weight. Consider a lower calorie diet and regular exercise.  5)  Check your blood sugars regularly. If your readings are usually above : or below 70 you should contact our office. 6)  It is important that your Diabetic A1c level is checked every 3 months. 7)  See your eye doctor yearly to check for diabetic eye damage. 8)  Check your feet each night for sore areas, calluses or signs of infection. Prescriptions: CRESTOR 20 MG TABS (ROSUVASTATIN CALCIUM) one daily  #90 x 6   Entered and Authorized by:   Gordy Savers  MD   Signed by:   Gordy Savers  MD on 09/03/2010   Method used:   Electronically to        [facility]  [address] [phone]* (retail)       [address]       [place], [place]  [address]       Ph: [phone] or [phone]       Fax: [phone]   RxID:   [id number]

## 2011-01-15 NOTE — Medication Information (Signed)
Summary: Certification for Diabetic Shoes  Certification for Diabetic Shoes   Imported By: Maryln Gottron 02/28/2010 10:20:28  _____________________________________________________________________  External Attachment:    Type:   Image     Comment:   External Document

## 2011-01-15 NOTE — Progress Notes (Signed)
Summary: refill hydrocodone apap  Phone Note Refill Request Message from:  Fax from Pharmacy on November 20, 2010 12:50 PM  Refills Requested: Medication #1:  VICODIN ES 7.5-750 MG TABS 1 by mouth q6hrs as needed pain   Last [facility]: [facility]   Method Requested: Fax to [facility] Initial call taken by: Duard Brady LPN,  November 20, 2010 12:50 PM  Follow-up for Phone Call        #90 needs rov this month Follow-up by: Gordy Savers  MD,  November 20, 2010 5:29 PM    Prescriptions: Haskell Flirt ES 7.5-750 MG TABS (HYDROCODONE-ACETAMINOPHEN) 1 by mouth q6hrs as needed pain  #90 x 0   Entered by:   Duard Brady LPN   Authorized by:   Gordy Savers  MD   Signed by:   Duard Brady LPN on 16/09/9603   Method used:   Historical   RxID:   [id number]

## 2011-01-15 NOTE — Assessment & Plan Note (Signed)
Summary: pain in right arm/some redness and swelling/cjr   Vital Signs:  Patient profile:   60 year old female Weight:      177 pounds BP sitting:   160 / 100  Vitals Entered By: Duard Brady LPN (March 28, 2010 10:11 AM)  Procedure Note  Injections: The patient complains of pain. Date of onset: 03/28/2010 Duration of symptoms: today Indication: acute pain Consent signed: no  Procedure # 1: trigger point injection    Region: lateral    Location: right upper arm    Technique: 24 g needle    Medication: 40 mg depomedrol    Anesthesia: 1.0 ml 1% lidocaine w/o epinephrine   History of Present Illness: 60 year old patient who is seen today complaining of right elbow pain.  She states that she awoke with the pain earlier this morning.  Yesterday she denies any unusual activities or trauma.  She has had no prior episodes of elbow tendinitis.  She denies any fever or other constitutional complaints.  She does have A. orthopedic physician .She has type 2 diabetes and recent hemoglobin A1c was slightly elevated.  At the time she was not taking mealtime insulin with every meal.  Her cholesterol was also strikingly elevated approaching 400.  Compliant issues addressed and she is on the Lipitor 80 mg daily.  Allergies: 1)  Tetanus Toxoid Adsorbed (Tetanus Toxoid Adsorbed) 2)  Cefaclor (Cefaclor)  Past History:  Past Medical History: Reviewed history from 06/29/2009 and no changes required. Diabetes mellitus, type II Hyperlipidemia Hypertension Cerebrovascular accident, hx of right paramedian pons infarct, January 2008 systolic murmur Anxiety peripheral neuropathy microalbuminuria history retinal detachment chronic kidney disease  Review of Systems  The patient denies anorexia, fever, weight loss, weight gain, vision loss, decreased hearing, hoarseness, chest pain, syncope, dyspnea on exertion, peripheral edema, prolonged cough, headaches, hemoptysis, abdominal pain,  melena, hematochezia, severe indigestion/heartburn, hematuria, incontinence, genital sores, muscle weakness, suspicious skin lesions, transient blindness, difficulty walking, depression, unusual weight change, abnormal bleeding, enlarged lymph nodes, angioedema, and breast masses.    Physical Exam  General:  overweight-appearing.  uncomfortable tearful at times due to right elbow pain.  Blood pressure 140/95 Lungs:  Normal respiratory effort, chest expands symmetrically. Lungs are clear to auscultation, no crackles or wheezes. Heart:  systolic murmur, unchanged Abdomen:  Bowel sounds positive,abdomen soft and non-tender without masses, organomegaly or hernias noted. Msk:  there is mild soft tissue swelling over the right lateral elbow area.  It was quite tender to superficial palpation.  Flexion and extension of the elbow aggravated the pain.  No excessive warmth or significant erythema   Impression & Recommendations:  Problem # 1:  TENDINITIS, RIGHT ELBOW (ICD-727.09)  Orders: Injection, Tendon / Ligament (10272)  Problem # 2:  HYPERTENSION (ICD-401.9)  Her updated medication list for this problem includes:    Lotrel 5-20 Mg Caps (Amlodipine besy-benazepril hcl) .Marland Kitchen... Take 2 capsule by mouth once a day  Problem # 3:  DIABETES MELLITUS, TYPE II (ICD-250.00)  Her updated medication list for this problem includes:    Bayer Aspirin 325 Mg Tabs (Aspirin) .Marland Kitchen... Take 1 tablet by mouth once a day    Lotrel 5-20 Mg Caps (Amlodipine besy-benazepril hcl) .Marland Kitchen... Take 2 capsule by mouth once a day    Lantus Solostar 100 Unit/ml Soln (Insulin glargine) .Marland Kitchen... 26 units at bedtime    Humalog Kwikpen 100 Unit/ml Soln (Insulin lispro (human)) .Marland KitchenMarland KitchenMarland KitchenMarland Kitchen 10-16 units prior to meals  Complete Medication List: 1)  Bayer Aspirin 325 Mg Tabs (  Aspirin) .... Take 1 tablet by mouth once a day 2)  Klor-con M20 20 Meq Tbcr (Potassium chloride crys cr) .Marland Kitchen.. 1 two times a day 3)  Lipitor 80 Mg Tabs (Atorvastatin  calcium) .Marland Kitchen.. 1 tablet by mouth once a day 4)  Lotrel 5-20 Mg Caps (Amlodipine besy-benazepril hcl) .... Take 2 capsule by mouth once a day 5)  Lantus Solostar 100 Unit/ml Soln (Insulin glargine) .... 26 units at bedtime 6)  Humalog Kwikpen 100 Unit/ml Soln (Insulin lispro (human)) .Marland Kitchen.. 10-16 units prior to meals 7)  Flexeril 10 Mg Tabs (Cyclobenzaprine hcl) .Marland Kitchen.. 1 three times a day as needed 8)  Novofine 30g X 8 Mm Misc (Insulin pen needle) .... Use three times a day 9)  Hydromet 5-1.5 Mg/40ml Syrp (Hydrocodone-homatropine) .Marland Kitchen.. 1 teaspoon every 6 hours as needed for cough 10)  Vicodin Es 7.5-750 Mg Tabs (Hydrocodone-acetaminophen)  Patient Instructions: 1)  orthopedic follow-up if not promptly improved 2)  placed the right arm in a sling 3)  take pain medications as directed 4)  Please schedule a follow-up appointment in 2 weeks. Prescriptions: VICODIN ES 7.5-750 MG TABS (HYDROCODONE-ACETAMINOPHEN)   #90 x 2   Entered and Authorized by:   Gordy Savers  MD   Signed by:   Gordy Savers  MD on 03/28/2010   Method used:   Print then Give to Patient   RxID:   [id number]

## 2011-01-15 NOTE — Progress Notes (Signed)
Summary: refill hydrocodone-acetamin  Phone Note Refill Request Message from:  Fax from Pharmacy on July 12, 2010 8:11 AM  Refills Requested: Medication #1:  VICODIN ES 7.5-750 MG TABS.   Last Ref[facility]: [facility] [address]    [phone]   Method Requested: Telephone to Pharmacy Initial call taken by: Duard Brady LPN,  July 12, 2010 8:11 AM  Follow-up for Phone Call        #90 RF3 Follow-up by: Gordy Savers  MD,  July 12, 2010 12:32 PM  Additional Follow-up for Phone Call Additional follow up Details #1::        [facility] Additional Follow-up by: Duard Brady LPN,  July 12, 2010 12:59 PM    New/Updated Medications: VICODIN ES 7.5-750 MG TABS (HYDROCODONE-ACETAMINOPHEN) 1 by mouth q6hrs as needed pain Prescriptions: VICODIN ES 7.5-750 MG TABS (HYDROCODONE-ACETAMINOPHEN)   #90 x 3   Entered by:   Duard Brady LPN   Authorized by:   Gordy Savers  MD   Signed by:   Duard Brady LPN on 62/13/0865   Method used:   Historical   RxID:   [id number]

## 2011-01-15 NOTE — Assessment & Plan Note (Signed)
Summary: 3 MNTH ROV//SLM/PT RSC/CJR   Vital Signs:  Patient profile:   60 year old female Weight:      171 pounds Temp:     97.8 degrees F BP sitting:   158 / 84  (left arm) Cuff size:   regular  Vitals Entered By: Raechel Ache, RN (January 01, 2010 10:32 AM) CC: 3 mo ROV, FBS 109. Is Patient Diabetic? Yes   CC:  3 mo ROV and FBS 109.Sarah [name]  History of Present Illness: 60 year old patient who has a history of type 2 diabetes, hypertension, dyslipidemia, and cerebrovascular disease.  She has chronic kidney disease. her last hemoglobin A1c7.8.  She states fasting blood sugars are fairly normal.  She takes mealtime insulin before breakfast only.  No recent eye examination.  She does have the history of diabetic retinopathy.  Denies any focal neurological symptoms.  She is also being followed by a [place], orthopedist, due to shoulder pain.  Denies any cardiac symptoms.  Allergies: 1)  Tetanus Toxoid Adsorbed (Tetanus Toxoid Adsorbed) 2)  Cefaclor (Cefaclor)  Past History:  Past Medical History: Reviewed history from 06/29/2009 and no changes required. Diabetes mellitus, type II Hyperlipidemia Hypertension Cerebrovascular accident, hx of right paramedian pons infarct, January 2008 systolic murmur Anxiety peripheral neuropathy microalbuminuria history retinal detachment chronic kidney disease  Past Surgical History: Reviewed history from 07/01/2008 and no changes required. Hemorrhoidectomy Tubal ligation status post multiple arthroscopies in the shoulders and elbows C-spine fusion 1997  2-D echocardiogram July 2003  Family History: Reviewed history from 07/01/2008 and no changes required. mother history of throat cancer,positive for  hypertension , vascular disease and diabetes  Review of Systems  The patient denies anorexia, fever, weight loss, weight gain, vision loss, decreased hearing, hoarseness, chest pain, syncope, dyspnea on exertion, peripheral edema,  prolonged cough, headaches, hemoptysis, abdominal pain, melena, hematochezia, severe indigestion/heartburn, hematuria, incontinence, genital sores, muscle weakness, suspicious skin lesions, transient blindness, difficulty walking, depression, unusual weight change, abnormal bleeding, enlarged lymph nodes, angioedema, and breast masses.    Physical Exam  General:  Well-developed,well-nourished,in no acute distress; alert,appropriate and cooperative throughout examination Head:  Normocephalic and atraumatic without obvious abnormalities. No apparent alopecia or balding. Eyes:  retinal hemorrhage.  retinal hemorrhage.   Ears:  External ear exam shows no significant lesions or deformities.  Otoscopic examination reveals clear canals, tympanic membranes are intact bilaterally without bulging, retraction, inflammation or discharge. Hearing is grossly normal bilaterally. Mouth:  Oral mucosa and oropharynx without lesions or exudates.  Teeth in good repair. Neck:  No deformities, masses, or tenderness noted. Chest Wall:  No deformities, masses, or tenderness noted. Lungs:  Normal respiratory effort, chest expands symmetrically. Lungs are clear to auscultation, no crackles or wheezes. Heart:  grade 3/6 systolic murmur Abdomen:  Bowel sounds positive,abdomen soft and non-tender without masses, organomegaly or hernias noted. Msk:  No deformity or scoliosis noted of thoracic or lumbar spine.   Pulses:  R and L carotid,radial,femoral,dorsalis pedis and posterior tibial pulses are full and equal bilaterally Skin:  Intact without suspicious lesions or rashes  Diabetes Management Exam:    Foot Exam (with socks and/or shoes not present):       Sensory-Pinprick/Light touch:          Left medial foot (L-4): diminished          Left dorsal foot (L-5): diminished          Left lateral foot (S-1): diminished          Right  medial foot (L-4): diminished          Right dorsal foot (L-5): diminished          Right  lateral foot (S-1): diminished       Sensory-Monofilament:          Left foot: diminished          Right foot: diminished       Inspection:          Left foot: normal          Right foot: normal       Nails:          Left foot: normal          Right foot: normal    Foot Exam by Podiatrist:       Date: 01/01/2010       Results: mild diabetic findings       Done by: PCP   Impression & Recommendations:  Problem # 1:  RENAL DISEASE, CHRONIC (ICD-593.9)  Problem # 2:  HYPERTENSION (ICD-401.9)  Her updated medication list for this problem includes:    Lotrel 5-20 Mg Caps (Amlodipine besy-benazepril hcl) .Sarah [name]... Take 2 capsule by mouth once a day  Her updated medication list for this problem includes:    Lotrel 5-20 Mg Caps (Amlodipine besy-benazepril hcl) .Sarah [name]... Take 2 capsule by mouth once a day  Problem # 3:  DIABETES MELLITUS, TYPE II (ICD-250.00)  Her updated medication list for this problem includes:    Bayer Aspirin 325 Mg Tabs (Aspirin) .Sarah [name]... Take 1 tablet by mouth once a day    Lotrel 5-20 Mg Caps (Amlodipine besy-benazepril hcl) .Sarah [name]... Take 2 capsule by mouth once a day    Lantus Solostar 100 Unit/ml Soln (Insulin glargine) .Sarah [name]... 26 units at bedtime    Humalog Kwikpen 100 Unit/ml Soln (Insulin lispro (human)) .Sarah KitchenMarland KitchenMarland KitchenMarland [name] 10-16 units prior to meals    Her updated medication list for this problem includes:    Bayer Aspirin 325 Mg Tabs (Aspirin) .Sarah [name]... Take 1 tablet by mouth once a day    Lotrel 5-20 Mg Caps (Amlodipine besy-benazepril hcl) .Sarah [name]... Take 2 capsule by mouth once a day    Lantus Solostar 100 Unit/ml Soln (Insulin glargine) .Sarah [name]... 26 units at bedtime    Humalog Kwikpen 100 Unit/ml Soln (Insulin lispro (human)) .Sarah KitchenMarland KitchenMarland KitchenMarland [name] 10-16 units prior to meals  Complete Medication List: 1)  Bayer Aspirin 325 Mg Tabs (Aspirin) .... Take 1 tablet by mouth once a day 2)  Klor-con M20 20 Meq Tbcr (Potassium chloride crys cr) .Sarah [name].. 1 two times a day 3)  Lipitor 80 Mg Tabs (Atorvastatin calcium)  .Sarah [name].. 1 tablet by mouth once a day 4)  Lotrel 5-20 Mg Caps (Amlodipine besy-benazepril hcl) .... Take 2 capsule by mouth once a day 5)  Lantus Solostar 100 Unit/ml Soln (Insulin glargine) .... 26 units at bedtime 6)  Humalog Kwikpen 100 Unit/ml Soln (Insulin lispro (human)) .Sarah [name].. 10-16 units prior to meals 7)  Flexeril 10 Mg Tabs (Cyclobenzaprine hcl) .Sarah [name].. 1 three times a day as needed 8)  Novofine 30g X 8 Mm Misc (Insulin pen needle) .... Use three times a day 9)  Hydromet 5-1.5 Mg/44ml Syrp (Hydrocodone-homatropine) .Sarah [name].. 1 teaspoon every 6 hours as needed for cough 10)  Vicodin Es 7.5-750 Mg Tabs (Hydrocodone-acetaminophen)  Other Orders: Venipuncture (16109) TLB-BMP (Basic Metabolic Panel-BMET) (80048-METABOL) TLB-Cholesterol, Total (82465-CHO) TLB-A1C / Hgb A1C (Glycohemoglobin) (83036-A1C)  Patient Instructions: 1)  Please schedule a follow-up appointment in 3 months. 2)  Limit your Sodium (Salt) to less than 2 grams a day(slightly less than 1/2 a teaspoon) to prevent fluid retention, swelling, or worsening of symptoms. 3)  It is important that you exercise regularly at least 20 minutes 5 times a week. If you develop chest pain, have severe difficulty breathing, or feel very tired , stop exercising immediately and seek medical attention. 4)  You need to lose weight. Consider a lower calorie diet and regular exercise.  5)  Check your blood sugars regularly. If your readings are usually above : or below 70 you should contact our office. 6)  It is important that your Diabetic A1c level is checked every 3 months. 7)  See your eye doctor yearly to check for diabetic eye damage.

## 2011-01-17 NOTE — Progress Notes (Signed)
Summary: refill vicodin  Phone Note Refill Request Message from:  Fax from Pharmacy on December 24, 2010 1:12 PM  Refills Requested: Medication #1:  VICODIN ES 7.5-750 MG TABS 1 by mouth q6hrs as needed pain   Last [facility]: [facility]   Method Requested: Fax to [facility] Initial call taken by: Duard Brady LPN,  December 24, 2010 1:13 PM  Follow-up for Phone Call        #90 Follow-up by: Gordy Savers  MD,  December 24, 2010 5:58 PM    Prescriptions: Haskell Flirt ES 7.5-750 MG TABS (HYDROCODONE-ACETAMINOPHEN) 1 by mouth q6hrs as needed pain  #90 x 0   Entered by:   Duard Brady LPN   Authorized by:   Gordy Savers  MD   Signed by:   Duard Brady LPN on 16/09/9603   Method used:   Historical   RxID:   [id number]  [facility]

## 2011-01-17 NOTE — Progress Notes (Signed)
Summary: wants alternative ---wants dr Kirtland Bouchard to return call  Phone Note Call from Patient Call back at Home Phone [phone]   Caller: Patient---live call Summary of Call: was rx's Novofine. it costs over $500.00 dollars. pt is requesting something cheaper. [facility] Initial call taken by: Warnell Forester,  December 04, 2010 3:36 PM  Follow-up for Phone Call        spoke with pt - she was unsure what [facility]. I explained that novofine was the needles that go on the insulin pens - and we had rx'd them for years - she will [facility] and ask them why now is it $500. and what the cheaper alternative would be, and call me back. K[facility] Follow-up by: Duard Brady LPN,  December 04, 2010 5:26 PM

## 2011-01-17 NOTE — Assessment & Plan Note (Signed)
Summary: 3 month rov/njr [facility] DUE TO ERROR/NJR   Vital Signs:  Patient profile:   60 year old female Weight:      171 pounds Temp:     97.7 degrees F oral BP sitting:   118 / 80  (left arm) Cuff size:   regular  Vitals Entered By: Duard Brady LPN (November 30, 2010 11:06 AM) CC: 3 mos rov- doing well    fbs 156 Is Patient Diabetic? Yes Did you bring your meter with you today? No   CC:  3 mos rov- doing well    fbs 156.  History of Present Illness:  60 year old patient who is seen today for follow-up of her type 2 diabetes.  She has hypertension, dyslipidemia, and cerebrovascular disease.  Her weight is down modestly.  Her last hemoglobin A1c over 8.  Fasting blood sugar today 156.  There have been some compliance issues.  Fasting blood sugars are rarely last from 100.  She does experience some nocturnal hypoglycemia after mealtime insulin and skip meals.  She remains on Crestor, which is  a  significant expense.  Allergies: 1)  Tetanus Toxoid Adsorbed (Tetanus Toxoid Adsorbed) 2)  Cefaclor (Cefaclor)  Past History:  Past Medical History: Reviewed history from 06/29/2009 and no changes required. Diabetes mellitus, type II Hyperlipidemia Hypertension Cerebrovascular accident, hx of right paramedian pons infarct, January 2008 systolic murmur Anxiety peripheral neuropathy microalbuminuria history retinal detachment chronic kidney disease  Past Surgical History: Reviewed history from 07/01/2008 and no changes required. Hemorrhoidectomy Tubal ligation status post multiple arthroscopies in the shoulders and elbows C-spine fusion 1997  2-D echocardiogram July 2003  Review of Systems  The patient denies anorexia, fever, weight loss, weight gain, vision loss, decreased hearing, hoarseness, chest pain, syncope, dyspnea on exertion, peripheral edema, prolonged cough, headaches, hemoptysis, abdominal pain, melena, hematochezia, severe indigestion/heartburn, hematuria,  incontinence, genital sores, muscle weakness, suspicious skin lesions, transient blindness, difficulty walking, depression, unusual weight change, abnormal bleeding, enlarged lymph nodes, angioedema, and breast masses.    Physical Exam  General:  overweight-appearing.  122/78overweight-appearing.   Head:  Normocephalic and atraumatic without obvious abnormalities. No apparent alopecia or balding. Mouth:  Oral mucosa and oropharynx without lesions or exudates.  Teeth in good repair. Neck:  No deformities, masses, or tenderness noted. Lungs:  Normal respiratory effort, chest expands symmetrically. Lungs are clear to auscultation, no crackles or wheezes. Heart:  Normal rate and regular rhythm. S1 and S2 normal without gallop, murmur, click, rub or other extra sounds. Abdomen:  Bowel sounds positive,abdomen soft and non-tender without masses, organomegaly or hernias noted. Msk:  No deformity or scoliosis noted of thoracic or lumbar spine.   Pulses:  R and L carotid,radial,femoral,dorsalis pedis and posterior tibial pulses are full and equal bilaterally Extremities:  No clubbing, cyanosis, edema, or deformity noted with normal full range of motion of all joints.     Impression & Recommendations:  Problem # 1:  HYPERTENSION (ICD-401.9)  Her updated medication list for this problem includes:    Lotrel 5-20 Mg Caps (Amlodipine besy-benazepril hcl) .Marland Kitchen... Take 2 capsule by mouth once a day  Her updated medication list for this problem includes:    Lotrel 5-20 Mg Caps (Amlodipine besy-benazepril hcl) .Marland Kitchen... Take 2 capsule by mouth once a day  Problem # 2:  HYPERLIPIDEMIA (ICD-272.4)  The following medications were removed from the medication list:    Crestor 20 Mg Tabs (Rosuvastatin calcium) ..... One daily Her updated medication list for this problem includes:  Pravastatin Sodium 40 Mg Tabs (Pravastatin sodium) ..... One daily will switch to pravastatin due to cost concerns.  She remains on  amlodipine for blood pressure control  The following medications were removed from the medication list:    Crestor 20 Mg Tabs (Rosuvastatin calcium) ..... One daily Her updated medication list for this problem includes:    Pravastatin Sodium 40 Mg Tabs (Pravastatin sodium) ..... One daily  Problem # 3:  DIABETES MELLITUS, TYPE II (ICD-250.00)  Her updated medication list for this problem includes:    Bayer Aspirin 325 Mg Tabs (Aspirin) .Marland Kitchen... Take 1 tablet by mouth once a day    Lotrel 5-20 Mg Caps (Amlodipine besy-benazepril hcl) .Marland Kitchen... Take 2 capsule by mouth once a day    Lantus Solostar 100 Unit/ml Soln (Insulin glargine) .Marland Kitchen... 26 units at bedtime    Humalog Kwikpen 100 Unit/ml Soln (Insulin lispro (human)) .Marland KitchenMarland KitchenMarland KitchenMarland Kitchen 10-16 units prior to meals there has been some modest weight loss.  His hemoglobin A1c is greater than 7 which is likely will uptitrate Lantus to 30 units  Orders: Venipuncture (46962) TLB-A1C / Hgb A1C (Glycohemoglobin) (83036-A1C)  Her updated medication list for this problem includes:    Bayer Aspirin 325 Mg Tabs (Aspirin) .Marland Kitchen... Take 1 tablet by mouth once a day    Lotrel 5-20 Mg Caps (Amlodipine besy-benazepril hcl) .Marland Kitchen... Take 2 capsule by mouth once a day    Lantus Solostar 100 Unit/ml Soln (Insulin glargine) .Marland Kitchen... 26 units at bedtime    Humalog Kwikpen 100 Unit/ml Soln (Insulin lispro (human)) .Marland KitchenMarland KitchenMarland KitchenMarland Kitchen 10-16 units prior to meals  Complete Medication List: 1)  Bayer Aspirin 325 Mg Tabs (Aspirin) .... Take 1 tablet by mouth once a day 2)  Klor-con M20 20 Meq Tbcr (Potassium chloride crys cr) .Marland Kitchen.. 1 two times a day 3)  Lotrel 5-20 Mg Caps (Amlodipine besy-benazepril hcl) .... Take 2 capsule by mouth once a day 4)  Lantus Solostar 100 Unit/ml Soln (Insulin glargine) .... 26 units at bedtime 5)  Humalog Kwikpen 100 Unit/ml Soln (Insulin lispro (human)) .Marland Kitchen.. 10-16 units prior to meals 6)  Flexeril 10 Mg Tabs (Cyclobenzaprine hcl) .Marland Kitchen.. 1 three times a day as needed 7)   Novofine 30g X 8 Mm Misc (Insulin pen needle) .... Use three times a day 8)  Vicodin Es 7.5-750 Mg Tabs (Hydrocodone-acetaminophen) .Marland Kitchen.. 1 by mouth q6hrs as needed pain 9)  Pravastatin Sodium 40 Mg Tabs (Pravastatin sodium) .... One daily  Patient Instructions: 1)  Limit your Sodium (Salt) to less than 2 grams a day(slightly less than 1/2 a teaspoon) to prevent fluid retention, swelling, or worsening of symptoms. 2)  It is important that you exercise regularly at least 20 minutes 5 times a week. If you develop chest pain, have severe difficulty breathing, or feel very tired , stop exercising immediately and seek medical attention. 3)  You need to lose weight. Consider a lower calorie diet and regular exercise.  4)  Check your blood sugars regularly. If your readings are usually above : or below 70 you should contact our office. 5)  It is important that your Diabetic A1c level is checked every 3 months. 6)  See your eye doctor yearly to check for diabetic eye damage. Prescriptions: PRAVASTATIN SODIUM 40 MG TABS (PRAVASTATIN SODIUM) one daily  #90 x 6   Entered and Authorized by:   Gordy Savers  MD   Signed by:   Gordy Savers  MD on 11/30/2010   Method used:   Print  then Give to Patient   RxID:   [id number] NOVOFINE 30G X 8 MM MISC (INSULIN PEN NEEDLE) use three times a day  #100 Each x 0   Entered and Authorized by:   Gordy Savers  MD   Signed by:   Gordy Savers  MD on 11/30/2010   Method used:   Print then Give to Patient   RxID:   [id number] FLEXERIL 10 MG  TABS (CYCLOBENZAPRINE HCL) 1 three times a day as needed  #100 Tablet x 4   Entered and Authorized by:   Gordy Savers  MD   Signed by:   Gordy Savers  MD on 11/30/2010   Method used:   Print then Give to Patient   RxID:   [id number] HUMALOG KWIKPEN 100 UNIT/ML  SOLN (INSULIN LISPRO (HUMAN)) 10-16 units prior to meals  #15 Each x 5   Entered and Authorized by:   Gordy Savers  MD   Signed by:   Gordy Savers  MD on 11/30/2010   Method used:   Print then Give to Patient   RxID:   [id number] LANTUS SOLOSTAR 100 UNIT/ML  SOLN (INSULIN GLARGINE) 26 units at bedtime  #3 x 6   Entered and Authorized by:   Gordy Savers  MD   Signed by:   Gordy Savers  MD on 11/30/2010   Method used:   Print then Give to Patient   RxID:   [id number] LOTREL 5-20 MG CAPS (AMLODIPINE BESY-BENAZEPRIL HCL) Take 2 capsule by mouth once a day  #90 x 6   Entered and Authorized by:   Gordy Savers  MD   Signed by:   Gordy Savers  MD on 11/30/2010   Method used:   Print then Give to Patient   RxID:   [id number] KLOR-CON M20 20 MEQ TBCR (POTASSIUM CHLORIDE CRYS CR) 1 two times a day  #90 Tablet x 3   Entered and Authorized by:   Gordy Savers  MD   Signed by:   Gordy Savers  MD on 11/30/2010   Method used:   Print then Give to Patient   RxID:   [id number]    Orders Added: 1)  Est. Patient Level IV [70350] 2)  Venipuncture [09381] 3)  TLB-A1C / Hgb A1C (Glycohemoglobin) [82993-Z1I]

## 2011-01-25 ENCOUNTER — Other Ambulatory Visit: Payer: Self-pay

## 2011-01-25 DIAGNOSIS — R52 Pain, unspecified: Secondary | ICD-10-CM

## 2011-01-25 MED ORDER — HYDROCODONE-ACETAMINOPHEN 7.5-750 MG PO TABS: 1.0000 | ORAL_TABLET | Freq: Four times a day (QID) | ORAL | Status: DC | PRN

## 2011-01-25 NOTE — Telephone Encounter (Signed)
#  90  RF 3 

## 2011-02-28 ENCOUNTER — Encounter: Payer: Self-pay | Admitting: Internal Medicine

## 2011-03-01 ENCOUNTER — Encounter: Payer: Self-pay | Admitting: Internal Medicine

## 2011-03-01 ENCOUNTER — Ambulatory Visit (INDEPENDENT_AMBULATORY_CARE_PROVIDER_SITE_OTHER): Payer: Medicare Other | Admitting: Internal Medicine

## 2011-03-01 DIAGNOSIS — E119 Type 2 diabetes mellitus without complications: Secondary | ICD-10-CM

## 2011-03-01 DIAGNOSIS — I10 Essential (primary) hypertension: Secondary | ICD-10-CM

## 2011-03-01 DIAGNOSIS — Z8679 Personal history of other diseases of the circulatory system: Secondary | ICD-10-CM

## 2011-03-01 DIAGNOSIS — E785 Hyperlipidemia, unspecified: Secondary | ICD-10-CM

## 2011-03-01 MED ORDER — INSULIN GLARGINE 100 UNIT/ML ~~LOC~~ SOLN: SUBCUTANEOUS | Status: DC

## 2011-03-01 MED ORDER — INSULIN LISPRO 100 UNIT/ML ~~LOC~~ SOLN: SUBCUTANEOUS | Status: DC

## 2011-03-01 NOTE — Progress Notes (Signed)
  Subjective:    Patient ID: Sarah [name], female    DOB: 1951/06/22, 60 y.o.   MRN: [id number]  HPI   60 year old patient who is in today for followup of her type 2 diabetes. She has been on Lantus 28 units at bedtime and states that fasting blood sugars are generally between 175 and 200 in the morning. She states that she occasionally has hypoglycemic readings at 3 to 4:00 in the morning with readings as low as 45. She is on mealtime insulin 12 units prior to each meal and at 8 units if blood sugar is over 200. Her last hemoglobin A1c was in excess of 8.  She has treated hypertension and has been on combination therapy. She claims compliance with her medications.  She has cerebral vascular disease but denies any focal neurological symptoms. She has a history of dyslipidemia on Pravachol 40 mg daily no muscle pain or weakness.    Review of Systems  Constitutional: Negative.   HENT: Negative for hearing loss, congestion, sore throat, rhinorrhea, dental problem, sinus pressure and tinnitus.   Eyes: Negative for pain, discharge and visual disturbance.  Respiratory: Negative for cough and shortness of breath.   Cardiovascular: Negative for chest pain, palpitations and leg swelling.  Gastrointestinal: Negative for nausea, vomiting, abdominal pain, diarrhea, constipation, blood in stool and abdominal distention.  Genitourinary: Negative for dysuria, urgency, frequency, hematuria, flank pain, vaginal bleeding, vaginal discharge, difficulty urinating, vaginal pain and pelvic pain.  Musculoskeletal: Negative for joint swelling, arthralgias and gait problem.  Skin: Negative for rash.  Neurological: Negative for dizziness, syncope, speech difficulty, weakness, numbness and headaches.  Hematological: Negative for adenopathy.  Psychiatric/Behavioral: Negative for behavioral problems, dysphoric mood and agitation. The patient is not nervous/anxious.        Objective:   Physical Exam  Constitutional:  She is oriented to person, place, and time. She appears well-developed and well-nourished.  HENT:  Head: Normocephalic.  Right Ear: External ear normal.  Left Ear: External ear normal.  Mouth/Throat: Oropharynx is clear and moist.  Eyes: Conjunctivae and EOM are normal. Pupils are equal, round, and reactive to light.  Neck: Normal range of motion. Neck supple. No thyromegaly present.  Cardiovascular: Normal rate, regular rhythm and intact distal pulses.   Murmur heard.       Grade 3/6 systolic murmur heard diffusely  Pulmonary/Chest: Effort normal and breath sounds normal.  Abdominal: Soft. Bowel sounds are normal. She exhibits no mass. There is no tenderness.  Musculoskeletal: Normal range of motion.  Lymphadenopathy:    She has no cervical adenopathy.  Neurological: She is alert and oriented to person, place, and time.  Skin: Skin is warm and dry. No rash noted.  Psychiatric: She has a normal mood and affect. Her behavior is normal.          Assessment & Plan:   diabetes mellitus. suboptimal control this position due to the nocturnal hypoglycemia we'll change Lantus to 30 units every morning. We'll continue mealtime insulin with sliding scale coverage. Once done modification discussed and encouraged.  Hypertension well controlled. We'll continue present regimen.  Dyslipidemia we'll continue pravastatin 40 mg daily . The cerebrovascular disease. Clinically stable.

## 2011-03-01 NOTE — Patient Instructions (Signed)
Please check your hemoglobin A1c every 3 months  You need to lose weight.  Consider a lower calorie diet and regular exercise.    It is important that you exercise regularly, at least 20 minutes 3 to 4 times per week.  If you develop chest pain or shortness of breath seek  medical attention.  Return in 3 months for follow-up

## 2011-03-05 LAB — POCT I-STAT, CHEM 8
Creatinine, Ser: 1.4 mg/dL — ABNORMAL HIGH (ref 0.4–1.2)
HCT: 41 % (ref 36.0–46.0)
Hemoglobin: 13.9 g/dL (ref 12.0–15.0)
Potassium: 4.2 mEq/L (ref 3.5–5.1)
Sodium: 141 mEq/L (ref 135–145)

## 2011-03-05 LAB — CBC
MCV: 85.3 fL (ref 78.0–100.0)
Platelets: 353 10*3/uL (ref 150–400)
WBC: 9.1 10*3/uL (ref 4.0–10.5)

## 2011-03-05 LAB — DIFFERENTIAL
Eosinophils Absolute: 0.1 10*3/uL (ref 0.0–0.7)
Lymphs Abs: 2.7 10*3/uL (ref 0.7–4.0)
Neutro Abs: 5.7 10*3/uL (ref 1.7–7.7)
Neutrophils Relative %: 62 % (ref 43–77)

## 2011-03-15 ENCOUNTER — Telehealth: Payer: Self-pay | Admitting: Internal Medicine

## 2011-03-15 NOTE — Telephone Encounter (Signed)
Pt called and said that she would rather have the Lantus Pen. Pt does not want to get bottles of insulin, because it is too expensive and she doesn't want to have to buy the needles for it. Pls call in Lantus FlexPen to [facility].

## 2011-03-15 NOTE — Telephone Encounter (Signed)
Attempt to call at wk# [facility] - call and check with pharmacy about what the cost on pens will be - most of the time vials are cheaper. Let me know what we need to order and i will do so. KIK

## 2011-03-18 ENCOUNTER — Other Ambulatory Visit: Payer: Self-pay

## 2011-03-18 MED ORDER — INSULIN GLARGINE 100 UNIT/ML ~~LOC~~ SOLN: 30.0000 [IU] | Freq: Every day | SUBCUTANEOUS | Status: DC

## 2011-03-18 MED ORDER — INSULIN LISPRO 100 UNIT/ML ~~LOC~~ SOLN: 10.0000 [IU] | Freq: Three times a day (TID) | SUBCUTANEOUS | Status: DC

## 2011-03-18 NOTE — Telephone Encounter (Signed)
Needs [facility] - uses PEN not vial . Will change in med list as sent new order

## 2011-03-26 ENCOUNTER — Ambulatory Visit (INDEPENDENT_AMBULATORY_CARE_PROVIDER_SITE_OTHER): Payer: Medicare PPO | Admitting: Internal Medicine

## 2011-03-26 ENCOUNTER — Encounter: Payer: Self-pay | Admitting: Internal Medicine

## 2011-03-26 DIAGNOSIS — J069 Acute upper respiratory infection, unspecified: Secondary | ICD-10-CM

## 2011-03-26 DIAGNOSIS — E119 Type 2 diabetes mellitus without complications: Secondary | ICD-10-CM

## 2011-03-26 DIAGNOSIS — I10 Essential (primary) hypertension: Secondary | ICD-10-CM

## 2011-03-26 DIAGNOSIS — N289 Disorder of kidney and ureter, unspecified: Secondary | ICD-10-CM

## 2011-03-26 LAB — CBC WITH DIFFERENTIAL/PLATELET
Basophils Absolute: 0 K/uL (ref 0.0–0.1)
Basophils Relative: 0.7 % (ref 0.0–3.0)
Eosinophils Absolute: 0 K/uL (ref 0.0–0.7)
Eosinophils Relative: 0.2 % (ref 0.0–5.0)
HCT: 37.6 % (ref 36.0–46.0)
Hemoglobin: 12.6 g/dL (ref 12.0–15.0)
Lymphocytes Relative: 19.9 % (ref 12.0–46.0)
Lymphs Abs: 1.3 K/uL (ref 0.7–4.0)
MCHC: 33.5 g/dL (ref 30.0–36.0)
MCV: 84.8 fl (ref 78.0–100.0)
Monocytes Absolute: 0.8 K/uL (ref 0.1–1.0)
Monocytes Relative: 12.1 % — ABNORMAL HIGH (ref 3.0–12.0)
Neutro Abs: 4.3 K/uL (ref 1.4–7.7)
Neutrophils Relative %: 67.1 % (ref 43.0–77.0)
Platelets: 194 K/uL (ref 150.0–400.0)
RBC: 4.44 Mil/uL (ref 3.87–5.11)
RDW: 13.9 % (ref 11.5–14.6)
WBC: 6.4 K/uL (ref 4.5–10.5)

## 2011-03-26 LAB — BASIC METABOLIC PANEL WITH GFR
BUN: 26 mg/dL — ABNORMAL HIGH (ref 6–23)
CO2: 26 meq/L (ref 19–32)
Calcium: 8.8 mg/dL (ref 8.4–10.5)
Chloride: 104 meq/L (ref 96–112)
Creatinine, Ser: 2.1 mg/dL — ABNORMAL HIGH (ref 0.4–1.2)
GFR: 30.32 mL/min — ABNORMAL LOW
Glucose, Bld: 173 mg/dL — ABNORMAL HIGH (ref 70–99)
Potassium: 4.9 meq/L (ref 3.5–5.1)
Sodium: 141 meq/L (ref 135–145)

## 2011-03-26 NOTE — Patient Instructions (Signed)
Cut your insulin dose by one half until your appetite has improved Check blood sugars prior to each meal Get plenty of rest, Drink lots of  clear liquids, and use  Vicodin every 6 hours for sore throat pain  Call or return to clinic prn if these symptoms worsen or fail to improve as anticipated.

## 2011-03-26 NOTE — Progress Notes (Signed)
  Subjective:    Patient ID: Sarah [name], female    DOB: 1951-06-14, 60 y.o.   MRN: [id number]  HPI  60 year old history of type 2 diabetes hypertension dyslipidemia. She presents with a three-day history of sore throat weakness and cough. Her chief complaint is sore throat interfering with swallowing she states there's been no very poor oral intake due to the severity of the sore throat there's been no fever chills chest pain shortness of breath or productive cough. She has modified her insulin therapy due to her diminished intake. She did have a slightly low blood sugar yesterday but her random blood sugar today 178   Review of Systems  Constitutional: Positive for fatigue. Negative for fever, chills and diaphoresis.  HENT: Positive for congestion and rhinorrhea. Negative for hearing loss, ear pain, sore throat, dental problem, sinus pressure and tinnitus.   Eyes: Negative for pain, discharge and visual disturbance.  Respiratory: Positive for cough. Negative for shortness of breath.   Cardiovascular: Negative for chest pain, palpitations and leg swelling.  Gastrointestinal: Negative for nausea, vomiting, abdominal pain, diarrhea, constipation, blood in stool and abdominal distention.  Genitourinary: Negative for dysuria, urgency, frequency, hematuria, flank pain, vaginal bleeding, vaginal discharge, difficulty urinating, vaginal pain and pelvic pain.  Musculoskeletal: Negative for joint swelling, arthralgias and gait problem.  Skin: Negative for rash.  Neurological: Negative for dizziness, syncope, speech difficulty, weakness, numbness and headaches.  Hematological: Negative for adenopathy.  Psychiatric/Behavioral: Negative for behavioral problems, dysphoric mood and agitation. The patient is not nervous/anxious.        Objective:   Physical Exam  Constitutional: She is oriented to person, place, and time. She appears well-developed and well-nourished. No distress.       Appears unwell  but in no acute distress Blood pressure 140/82 O2 saturation 98% Pulse rate 84  HENT:  Head: Normocephalic and atraumatic.  Right Ear: External ear normal.  Left Ear: External ear normal.  Nose: Nose normal.       Oropharynx appeared to be minimally erythematous. There is no exudate  Eyes: Conjunctivae and EOM are normal. Pupils are equal, round, and reactive to light.  Neck: Normal range of motion. Neck supple. No JVD present. No thyromegaly present.       Neck is supple no cervical adenopathy  Cardiovascular: Normal rate, regular rhythm, normal heart sounds and intact distal pulses.   Pulmonary/Chest: Effort normal and breath sounds normal.  Abdominal: Soft. Bowel sounds are normal. She exhibits no mass. There is no tenderness.  Musculoskeletal: Normal range of motion.  Lymphadenopathy:    She has no cervical adenopathy.  Neurological: She is alert and oriented to person, place, and time.  Skin: Skin is warm and dry. No rash noted.  Psychiatric: She has a normal mood and affect. Her behavior is normal.          Assessment & Plan:  Viral URI Diabetes mellitus. Will modify insulin therapy until her by mouth intake improves. Pharyngitis. Will ask  the patient to use her Vicodin every 6 hours for cough and pain control We'll check a CBC and BMet

## 2011-05-03 NOTE — Assessment & Plan Note (Signed)
[facility]                            [facility] OFFICE NOTE   NAME:[name], Sarah SANBORN                       MRN:          [id number]  DATE:12/22/2006                            DOB:          06-Jul-1951    The patient is seen today with about a 7-day history of swelling above  the left eye.  She states that she had her hair done approximately 2  weeks ago and received a nick to the upper eyebrow when they did some  shaving or plucking of her eyebrow.  She has noted some pain and  swelling since that time.  She has used neosporin, but it continues to  worsen.   EXAMINATION:  Revealed the conjunctiva and cornea to be clear.  Extraocular muscles were full.  She had a small 0.5 cm crusted lesion  just above the left eyebrow.  There is some surrounding soft tissue  induration and erythema.  There is some mild left lid edema.   IMPRESSION:  Facial cellulitis.   DISPOSITION:  We will treat with Avelox for 10 days.  Prescriptions were  dispensed.  She will be rechecked in 3 days.  She was told of the  potential seriousness of this in view of the location, and she will  report any prompt worsening.     Gordy Savers, MD  Electronically Signed    PFK/MedQ  DD: 12/22/2006  DT: 12/22/2006  Job #: [id number]

## 2011-05-03 NOTE — Op Note (Signed)
[place]  Patient:    Sarah [name], HOBBINS Visit Number: [id number] MRN: [id number]          Service Type: Attending:  Katy Fitch. Naaman Plummer., M.D. Dictated by:   Katy Fitch Naaman Plummer., M.D. Proc. Date: 09/08/01 Adm. Date:  09/08/01                             Operative Report  PREOPERATIVE DIAGNOSES: 1. 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 2. Locking stenosing tenosynovitis left long finger.  POSTOPERATIVE DIAGNOSES: 1. 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 2. Locking stenosing tenosynovitis left long finger.  PROCEDURE: 1. Release of right long finger A1 pulley. 2. Release of left long finger A1 pulley.  SURGEON:  Katy Fitch. Sypher, Montez Hageman., M.D.  ASSISTANT:  Jonni Sanger, P.A.  ANESTHESIA:  0.25% Marcaine and 2% lidocaine digital block, right long finger and left long finger supplemented by IV sedation.  SUPERVISING ANESTHESIOLOGIST:  Janetta Hora. Gelene Mink, M.D.  INDICATIONS: Sarah [name] is a 60 year old woman who has had bilateral long finger stenosing tenosynovitis with locking and pain.  She has failed nonoperative measures including anti-inflammatory medications and splinting. Due to a failure to respond to nonoperative measures she is brought to the operating room at this time for release of her right long finger A1 pulley and her left long finger A1 pulley.  PROCEDURE:  Saraann Enneking brought to the operating room and placed in the supine position on the operating table.  Following light sedation the right and left arms were prepped with Betadine soap and solution and sterilely draped.  A pneumatic tourniquet was applied to the proximal right brachium and due to IV access at the left antecubital fossa an Esmarch tourniquet was used on the left hand and forearm.  The right arm was exsanguinated and the arterial tourniquet inflated to 280 mmHg.  The procedure commenced with a short  incision in the distal palmar crease directly over the long finger of the A1 pulley.  Subcutaneous tissues were carefully divided revealing the A1 pulley.  This was split with scissors and a small A0 pulley was released. Thereafter full range of motion of the fingers recovered actively and demonstrated by Sarah [name].  This wound was repaired with intradermal 0 Prolene suture.  A compressive dressing applied with sterile gauze, and Ace wrap.  Tourniquet was released with immediate capillary refill to the thumb and fingers.  Attention was then directed to the left hand which was exsanguinated with an Esmarch bandage that was placed on the forearm with four wraps to create a forearm level tourniquet.  The procedure commenced with a short incision in the line of the distal palmar crease directly over the A1 pulley.  Subcutaneous tissues were carefully divided releasing the palmar fascia.  The A1 pulley was isolated and split with scissors and a small A0 pulley released.  Thereafter full range of motion of the long fingers were demonstrated by Sarah [name].  The wound was repaired with intradermal 3-0 Prolene suture.  A compressive dressing applied and sterile gauze, and Ace wrap.  For aftercare Sarah [name] will follow her glucose closely.  She is given a prescription for Vicodin 5 mg 1 or 2 tablets p.o. q.4-6h. p.r.n. pain, 20 tablets without refill.  She will use ibuprofen as an adjunctive analgesic. Dictated by:   Katy Fitch Naaman Plummer., M.D. Attending:  Katy Fitch. Naaman Plummer., M.D. DD:  08/19/01  TD:  09/08/01 Job: [id number]

## 2011-05-03 NOTE — Op Note (Signed)
[place]. [facility]  Patient:    Sarah [name], Sarah [name]                       MRN: [id number] Proc. Date: 04/20/01 Adm. Date:  [id number] Disc. 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  PROCEDURES:  Scleral buckle and retinal cryopexy with the application of a 287 solid silicone explant inferiorly from the 2:30 to the 8:30 meridian.  ANESTHESIA:  General endotracheal anesthesia.  SURGEON:  Ernesto Rutherford, M.D.  INDICATION FOR PROCEDURE:  The patient is a 60 year old woman with an inferior rhegmatogenous retinal detachment threatening total and complete loss of vision with progression should it be untreated.  This is an attempt to reattach the retina so as to preserve peripheral vision as well as to prevent extension of retinal detachment superiorly to the fovea.  The patient understands the risks of anesthesia, including the rare occurrence of death, and loss to the eye, including hemorrhage, infection, scarring, need for further surgery, no change in vision, loss of vision, and progression of disease despite intervention.  The appropriate signed consent was obtained.  DESCRIPTION OF PROCEDURE:  The patient was taken to the operating room.  In the operating room, general endotracheal anesthesia instituted without difficulty.  The right periocular region was sterilely prepped and draped in the usual ophthalmic fashion.  A lid speculum applied.  Conjunctival peritomy fashioned 360 degrees.  Rectus muscles isolated on 2-0 silk ties.  Retinal cryopexy was applied and on indirect observation with indirect ophthalmoscopy, two suspicious retinal hole at the 6 oclock position.  No other retinal holes were identified.  Scleral detachment  decreased substantially with manipulation.  The globe softened nicely.  A paracentesis was fashioned with a 30-gauge needle into the anterior chamber safely.  At this time, solid silicone explant 287 was placed under the medial and the inferior rectus muscles and secured with three interrupted 5-0 M[place]il[place] mattress sutures in the inferotemporal quadrant and three in the inferonasal quadrant for excellent buckle height.  No drainage of the subretinal fluid directly was necessary.  Excellent scleral buckle height was obtained in this fashion to support the inferior vitreous base.  At this time, intraocular pressure was assessed and found to be adequate.  The conjunctiva was then brought forward and closed with interrupted and running 7-0 Vicryl suture.  The bed and buckle had been irrigated with bug juice.  At this time, subconjunctival injection of antibiotics were applied.  The right periocular region was then covered with a Fox shield and a sterile patch. The patient tolerated the procedure well without complication.  The retina was nicely reattached.  She was taken to the recovery room in good and stable condition. DD:  05/18/01 TD:  05/18/01 Job: [id number]

## 2011-05-03 NOTE — Assessment & Plan Note (Signed)
[facility]                            [facility] OFFICE NOTE   NAME:Sarah [name], Sarah [name]                       MRN:          [id number]  DATE:01/13/2007                            DOB:          [birth year]    A 60 year old black female presents to the office with a three day  history of gait abnormality.  She states that she has had some  unsteadiness and has a tendency to drag her left foot.  She has noticed  some numbness involving the left hand.  She has a long history of  diabetes complicated by peripheral neuropathy, retinopathy, and  microalbuminuria.  She is on Lantus and mealtime Humalog.  She has  hypertension and hyperlipidemia.   PHYSICAL EXAMINATION:  GENERAL:  Examination today revealed her to be in  no acute distress.  VITAL SIGNS:  Blood pressure is 160/90.  HEENT:  Pupillary responses were normal.  Extraocular movements were  full.  There is some suggestion of some left facial weakness.  Facial  sensation was intact.  Tongue and uvula were midline.  Shoulder shrug  was normal.  Motor examination did reveal some mild left-sided weakness  with slight diminished left grip strength as well as drift to the left  arm.  She had some left-sided dystaxia and finger-to-nose testing was  poorly performed on the left.  Romberg was negative.  Gait was slightly  unsteady but not grossly ataxic.   IMPRESSION:  Probable small vessel ischemic stroke.   DISPOSITION:  Will set up for prompt brain MRI as well as carotid artery  Doppler evaluation.  Will add HCTZ to her regimen and increased Lotrel  to b.i.d. regimen.  She will return in three days for followup.  Laboratory studies will be checked today.  Possible admission discussed,  and she wishes to proceed with outpatient management.  We will call her  promptly if there is any change in her clinical status.     Gordy Savers, MD  Electronically Signed    PFK/MedQ  DD: 01/13/2007  DT:  01/13/2007  Job #: [id number]

## 2011-05-28 ENCOUNTER — Ambulatory Visit (INDEPENDENT_AMBULATORY_CARE_PROVIDER_SITE_OTHER): Payer: Medicare PPO | Admitting: Internal Medicine

## 2011-05-28 ENCOUNTER — Encounter: Payer: Self-pay | Admitting: Internal Medicine

## 2011-05-28 DIAGNOSIS — E785 Hyperlipidemia, unspecified: Secondary | ICD-10-CM

## 2011-05-28 DIAGNOSIS — I10 Essential (primary) hypertension: Secondary | ICD-10-CM

## 2011-05-28 DIAGNOSIS — E119 Type 2 diabetes mellitus without complications: Secondary | ICD-10-CM

## 2011-05-28 DIAGNOSIS — R52 Pain, unspecified: Secondary | ICD-10-CM

## 2011-05-28 DIAGNOSIS — N289 Disorder of kidney and ureter, unspecified: Secondary | ICD-10-CM

## 2011-05-28 LAB — BASIC METABOLIC PANEL
CO2: 24 mEq/L (ref 19–32)
Calcium: 8.9 mg/dL (ref 8.4–10.5)
Creatinine, Ser: 1.6 mg/dL — ABNORMAL HIGH (ref 0.4–1.2)
Glucose, Bld: 125 mg/dL — ABNORMAL HIGH (ref 70–99)

## 2011-05-28 MED ORDER — HYDROCODONE-ACETAMINOPHEN 7.5-750 MG PO TABS: 1.0000 | ORAL_TABLET | Freq: Four times a day (QID) | ORAL | Status: DC | PRN

## 2011-05-28 MED ORDER — AMLODIPINE BESY-BENAZEPRIL HCL 5-20 MG PO CAPS: 1.0000 | ORAL_CAPSULE | Freq: Every day | ORAL | Status: DC

## 2011-05-28 MED ORDER — PRAVASTATIN SODIUM 40 MG PO TABS: 40.0000 mg | ORAL_TABLET | Freq: Every day | ORAL | Status: DC

## 2011-05-28 NOTE — Progress Notes (Signed)
  Subjective:    Patient ID: Sarah [name], female    DOB: [birth year], 60 y.o.   MRN: [id number]  HPI  60 year old patient who is seen today for followup of her type 2 diabetes. She has been on Lantus at bedtime. She has been instructed to take 30 units at bedtime she stated initially that she was taking only 12 units and then she stated that she was actually taken 26 units she is also on Humalog mealtime insulin she states she takes 12 units and occasionally more dependent on blood sugars. She has had a recent eye examination. There was concern about a hemorrhage but further evaluation revealed this was either old or not a true retinal hemorrhage. She has treated hypertension. She has been compliant with her medication. She denies any cardiopulmonary complaints. 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    Review of Systems  Constitutional: Negative.   HENT: Negative for hearing loss, congestion, sore throat, rhinorrhea, dental problem, sinus pressure and tinnitus.   Eyes: Negative for pain, discharge and visual disturbance.  Respiratory: Negative for cough and shortness of breath.   Cardiovascular: Negative for chest pain, palpitations and leg swelling.  Gastrointestinal: Negative for nausea, vomiting, abdominal pain, diarrhea, constipation, blood in stool and abdominal distention.  Genitourinary: Negative for dysuria, urgency, frequency, hematuria, flank pain, vaginal bleeding, vaginal discharge, difficulty urinating, vaginal pain and pelvic pain.  Musculoskeletal: Negative for joint swelling, arthralgias and gait problem.       [Left shoulder pain Skin: Negative for rash.  Neurological: Negative for dizziness, syncope, speech difficulty, weakness, numbness and headaches.  Hematological: Negative for adenopathy.  Psychiatric/Behavioral: Negative for behavioral problems, dysphoric mood and agitation. The  patient is not nervous/anxious.        Objective:   Physical Exam  Constitutional: She is oriented to person, place, and time. She appears well-developed and well-nourished. No distress.       Mildly overweight  HENT:  Head: Normocephalic.  Right Ear: External ear normal.  Left Ear: External ear normal.  Mouth/Throat: Oropharynx is clear and moist.  Eyes: Conjunctivae and EOM are normal. Pupils are equal, round, and reactive to light.  Neck: Normal range of motion. Neck supple. No thyromegaly present.  Cardiovascular: Normal rate, regular rhythm and intact distal pulses.   Murmur heard.      Grade 3/6 systolic murmur  Pulmonary/Chest: Effort normal and breath sounds normal.  Abdominal: Soft. Bowel sounds are normal. She exhibits no mass. There is no tenderness.  Musculoskeletal: Normal range of motion.  Lymphadenopathy:    She has no cervical adenopathy.  Neurological: She is alert and oriented to person, place, and time.  Skin: Skin is warm and dry. No rash noted.  Psychiatric: She has a normal mood and affect. Her behavior is normal.          Assessment & Plan:   Diabetes mellitus. We'll check a hemoglobin A1c. Compliance stressed Hypertension- repeat blood pressure was normal at 120/78. We'll continue her present regimen Dyslipidemia. We'll continue Pravachol History chronic kidney disease. We'll check a BMet

## 2011-05-28 NOTE — Patient Instructions (Signed)
Limit your sodium (Salt) intake  Please check your blood pressure on a regular basis.  If it is consistently greater than 150/90, please make an office appointment.   Please check your hemoglobin A1c every 3 months    It is important that you exercise regularly, at least 20 minutes 3 to 4 times per week.  If you develop chest pain or shortness of breath seek  medical attention.  You need to lose weight.  Consider a lower calorie diet and regular exercise. 

## 2011-08-28 ENCOUNTER — Encounter: Payer: Self-pay | Admitting: Internal Medicine

## 2011-08-28 ENCOUNTER — Ambulatory Visit (INDEPENDENT_AMBULATORY_CARE_PROVIDER_SITE_OTHER): Payer: Medicare PPO | Admitting: Internal Medicine

## 2011-08-28 VITALS — BP 130/80 | Temp 97.7°F | Wt 168.0 lb

## 2011-08-28 DIAGNOSIS — E119 Type 2 diabetes mellitus without complications: Secondary | ICD-10-CM

## 2011-08-28 DIAGNOSIS — Z0000 Encounter for general adult medical examination without abnormal findings: Secondary | ICD-10-CM

## 2011-08-28 DIAGNOSIS — I10 Essential (primary) hypertension: Secondary | ICD-10-CM

## 2011-08-28 DIAGNOSIS — Z23 Encounter for immunization: Secondary | ICD-10-CM

## 2011-08-28 DIAGNOSIS — N289 Disorder of kidney and ureter, unspecified: Secondary | ICD-10-CM

## 2011-08-28 DIAGNOSIS — E785 Hyperlipidemia, unspecified: Secondary | ICD-10-CM

## 2011-08-28 DIAGNOSIS — R52 Pain, unspecified: Secondary | ICD-10-CM

## 2011-08-28 MED ORDER — HYDROCODONE-ACETAMINOPHEN 7.5-750 MG PO TABS: 1.0000 | ORAL_TABLET | Freq: Four times a day (QID) | ORAL | Status: DC | PRN

## 2011-08-28 NOTE — Patient Instructions (Signed)
Limit your sodium (Salt) intake   Please check your hemoglobin A1c every 3 months    It is important that you exercise regularly, at least 20 minutes 3 to 4 times per week.  If you develop chest pain or shortness of breath seek  medical attention.  You need to lose weight.  Consider a lower calorie diet and regular exercise. 

## 2011-08-28 NOTE — Progress Notes (Signed)
  Subjective:    Patient ID: Sarah [name], female    DOB: 1951-10-16, 60 y.o.   MRN: [id number]  HPI 60 year old patient who is in today for followup. She has a history of type 2 diabetes. Her last hemoglobin A1c was not well controlled. Presently she is on Lantus 30 units at bedtime. She is also on mealtime insulin. She takes 14 units prior to each meal 3 times daily and add 4 units if blood sugar is elevated. She is on pravastatin for dyslipidemia she has treated hypertension. She has chronic kidney disease and cerebrovascular disease. Complaints today include some congestion and left ear pain otherwise she seems to be doing quite well   Review of Systems  Constitutional: Negative.   HENT: Positive for ear pain and congestion. Negative for hearing loss, sore throat, rhinorrhea, dental problem, sinus pressure and tinnitus.   Eyes: Negative for pain, discharge and visual disturbance.  Respiratory: Negative for cough and shortness of breath.   Cardiovascular: Negative for chest pain, palpitations and leg swelling.  Gastrointestinal: Negative for nausea, vomiting, abdominal pain, diarrhea, constipation, blood in stool and abdominal distention.  Genitourinary: Negative for dysuria, urgency, frequency, hematuria, flank pain, vaginal bleeding, vaginal discharge, difficulty urinating, vaginal pain and pelvic pain.  Musculoskeletal: Negative for joint swelling, arthralgias and gait problem.  Skin: Negative for rash.  Neurological: Negative for dizziness, syncope, speech difficulty, weakness, numbness and headaches.  Hematological: Negative for adenopathy.  Psychiatric/Behavioral: Negative for behavioral problems, dysphoric mood and agitation. The patient is not nervous/anxious.        Objective:   Physical Exam  Constitutional: She is oriented to person, place, and time. She appears well-developed and well-nourished.       Blood pressure 120 over 82  HENT:  Head: Normocephalic.  Right Ear:  External ear normal.  Left Ear: External ear normal.  Mouth/Throat: Oropharynx is clear and moist.  Eyes: Conjunctivae and EOM are normal. Pupils are equal, round, and reactive to light.  Neck: Normal range of motion. Neck supple. No thyromegaly present.  Cardiovascular: Normal rate, regular rhythm and intact distal pulses.   Murmur heard.      Grade 2/6 systolic murmur  Left dorsalis pedis pulses minimally diminished  Pulmonary/Chest: Effort normal and breath sounds normal.  Abdominal: Soft. Bowel sounds are normal. She exhibits no mass. There is no tenderness.  Musculoskeletal: Normal range of motion.  Lymphadenopathy:    She has no cervical adenopathy.  Neurological: She is alert and oriented to person, place, and time.  Skin: Skin is warm and dry. No rash noted.  Psychiatric: She has a normal mood and affect. Her behavior is normal.          Assessment & Plan:    Diabetes mellitus. We'll check a hemoglobin A1c. We'll probably require titration of her insulin dosing Hypertension well controlled Chronic kidney disease. Dyslipidemia. Will continue present regimen  Diet exercise weight loss are encouraged. Will recheck 3 months

## 2011-08-29 ENCOUNTER — Other Ambulatory Visit: Payer: Self-pay | Admitting: Internal Medicine

## 2011-08-29 MED ORDER — INSULIN LISPRO 100 UNIT/ML ~~LOC~~ SOLN: 18.0000 [IU] | Freq: Three times a day (TID) | SUBCUTANEOUS | Status: DC

## 2011-08-29 MED ORDER — INSULIN GLARGINE 100 UNIT/ML ~~LOC~~ SOLN: 45.0000 [IU] | Freq: Every day | SUBCUTANEOUS | Status: DC

## 2011-08-29 NOTE — Progress Notes (Signed)
Quick Note:  Attempt to call pt - VM - LMTCB to discuss labs and change in insulins - changes to med list done ______

## 2011-10-04 ENCOUNTER — Ambulatory Visit (INDEPENDENT_AMBULATORY_CARE_PROVIDER_SITE_OTHER): Payer: Medicare PPO | Admitting: Internal Medicine

## 2011-10-04 ENCOUNTER — Encounter: Payer: Self-pay | Admitting: Internal Medicine

## 2011-10-04 DIAGNOSIS — E119 Type 2 diabetes mellitus without complications: Secondary | ICD-10-CM

## 2011-10-04 DIAGNOSIS — N289 Disorder of kidney and ureter, unspecified: Secondary | ICD-10-CM

## 2011-10-04 DIAGNOSIS — M109 Gout, unspecified: Secondary | ICD-10-CM

## 2011-10-04 MED ORDER — METHYLPREDNISOLONE ACETATE 40 MG/ML IJ SUSP
40.0000 mg | Freq: Once | INTRAMUSCULAR | Status: AC
  Administered 2011-10-04: 40 mg via INTRAMUSCULAR

## 2011-10-04 MED ORDER — LISINOPRIL 20 MG PO TABS: 20.0000 mg | ORAL_TABLET | Freq: Every day | ORAL | Status: DC

## 2011-10-04 MED ORDER — PREDNISONE 10 MG PO TABS: 10.0000 mg | ORAL_TABLET | Freq: Every day | ORAL | Status: AC

## 2011-10-04 MED ORDER — AMLODIPINE BESYLATE 5 MG PO TABS: 5.0000 mg | ORAL_TABLET | Freq: Every day | ORAL | Status: DC

## 2011-10-04 NOTE — Patient Instructions (Signed)
Keep left hand elevated as much as possible Take medications  as directed  Call or return to clinic prn if these symptoms worsen or fail to improve as anticipated.

## 2011-10-04 NOTE — Progress Notes (Signed)
  Subjective:    Patient ID: Sarah [name], female    DOB: [birth year], 60 y.o.   MRN: [id number]  HPI 60 year old patient who is seen today complaining of pain involving her left PIP joint. She was seen in urgent care 2 days ago and laboratory studies and x-rays were obtained she was told she had acute gout. She was given a prescription for prednisone which she has not filled yet she complains of persistent pain involving this joint. There's been no fever or other systemic complaints. She has diabetes controlled with insulin therapy as well as chronic kidney disease. She does have Vicodin for pain. She is objecting to the cost of her generic Lotrel   Review of Systems  Constitutional: Negative.   HENT: Negative for hearing loss, congestion, sore throat, rhinorrhea, dental problem, sinus pressure and tinnitus.   Eyes: Negative for pain, discharge and visual disturbance.  Respiratory: Negative for cough and shortness of breath.   Cardiovascular: Negative for chest pain, palpitations and leg swelling.  Gastrointestinal: Negative for nausea, vomiting, abdominal pain, diarrhea, constipation, blood in stool and abdominal distention.  Genitourinary: Negative for dysuria, urgency, frequency, hematuria, flank pain, vaginal bleeding, vaginal discharge, difficulty urinating, vaginal pain and pelvic pain.  Musculoskeletal: Positive for joint swelling. Negative for arthralgias and gait problem.  Skin: Negative for rash.  Neurological: Negative for dizziness, syncope, speech difficulty, weakness, numbness and headaches.  Hematological: Negative for adenopathy.  Psychiatric/Behavioral: Negative for behavioral problems, dysphoric mood and agitation. The patient is not nervous/anxious.        Objective:   Physical Exam  Constitutional: She appears well-developed and well-nourished.       Uncomfortable due to painful left index finger Temperature 90.8 degrees Blood pressure 120/78            Assessment & Plan:

## 2011-10-08 ENCOUNTER — Telehealth: Payer: Self-pay | Admitting: *Deleted

## 2011-10-08 NOTE — Telephone Encounter (Signed)
Discontinue prednisone.

## 2011-10-08 NOTE — Telephone Encounter (Signed)
Patient states that she was seeing "black spots" this morning and feels that it may be from the Prednisone [has taken 5 days]. Patient informed to stop Prednisone until response from MD.

## 2011-10-08 NOTE — Telephone Encounter (Signed)
Patient Informed

## 2011-11-27 ENCOUNTER — Encounter: Payer: Self-pay | Admitting: Internal Medicine

## 2011-11-27 ENCOUNTER — Ambulatory Visit (INDEPENDENT_AMBULATORY_CARE_PROVIDER_SITE_OTHER): Payer: Medicare PPO | Admitting: Internal Medicine

## 2011-11-27 DIAGNOSIS — E119 Type 2 diabetes mellitus without complications: Secondary | ICD-10-CM

## 2011-11-27 DIAGNOSIS — N289 Disorder of kidney and ureter, unspecified: Secondary | ICD-10-CM

## 2011-11-27 DIAGNOSIS — I10 Essential (primary) hypertension: Secondary | ICD-10-CM

## 2011-11-27 LAB — BASIC METABOLIC PANEL
BUN: 29 mg/dL — ABNORMAL HIGH (ref 6–23)
Chloride: 109 mEq/L (ref 96–112)
Glucose, Bld: 158 mg/dL — ABNORMAL HIGH (ref 70–99)
Potassium: 4.7 mEq/L (ref 3.5–5.1)

## 2011-11-27 LAB — GLUCOSE, POCT (MANUAL RESULT ENTRY): POC Glucose: 120

## 2011-11-27 MED ORDER — CYCLOBENZAPRINE HCL 10 MG PO TABS: 10.0000 mg | ORAL_TABLET | Freq: Three times a day (TID) | ORAL | Status: DC | PRN

## 2011-11-27 MED ORDER — INSULIN GLARGINE 100 UNIT/ML ~~LOC~~ SOLN: 40.0000 [IU] | Freq: Every day | SUBCUTANEOUS | Status: DC

## 2011-11-27 MED ORDER — POTASSIUM BICARBONATE 25 MEQ PO TBEF: EFFERVESCENT_TABLET | ORAL | Status: DC

## 2011-11-27 NOTE — Patient Instructions (Addendum)
Limit your sodium (Salt) intake    It is important that you exercise regularly, at least 20 minutes 3 to 4 times per week.  If you develop chest pain or shortness of breath seek  medical attention.   Please check your hemoglobin A1c every 3 months Leg Cramps Leg cramps that occur during exercise can be caused by poor circulation or dehydration. However, muscle cramps that occur at rest or during the night are usually not due to any serious medical problem. Heat cramps may cause muscle spasms during hot weather.   CAUSES There is no clear cause for muscle cramps. However, dehydration may be a factor for those who do not drink enough fluids and those who exercise in the heat. Imbalances in the level of sodium, potassium, calcium or magnesium in the muscle tissue may also be a factor. Some medications, such as water pills (diuretics), may cause loss of chemicals that the body needs (like sodium and potassium) and cause muscle cramps. TREATMENT    Make sure your diet has enough fluids and essential minerals for the muscle to work normally.     Avoid strenuous exercise for several days if you have been having frequent leg cramps.     Stretch and massage the cramped muscle for several minutes.     Some medicines may be helpful in some patients with night cramps. Only take over-the-counter or prescription medicines as directed by your caregiver.  SEEK IMMEDIATE MEDICAL CARE IF:    Your leg cramps become worse.     Your foot becomes cold, numb, or blue.  Document Released: 01/09/2005 Document Revised: 08/14/2011 Document Reviewed: 12/27/2008 [facility] Patient Information 2012 [place], [place].

## 2011-11-27 NOTE — Progress Notes (Signed)
  Subjective:    Patient ID: Sarah [name], female    DOB: 01-16-1951, 60 y.o.   MRN: [id number]  HPI  60 year old patient who is seen today for followup of her type 2 diabetes. She has down titrated her Lantus insulin takes 40  units at bedtime.  Fasting blood sugars very quite a bit. She's had a recent cortisone injection with aggravation of her glycemic control. She takes 20 units prior to meals and depending on blood sugars takes a next drug 10 or 15 units. Her chief complaint today is nocturnal leg cramps. This has been a chronic problem she has been performing stretching exercises before going to bed taken and empiric potassium supplement and also taking a muscle relaxer at bedtime She has treated hypertension and chronic kidney disease    Review of Systems  Constitutional: Negative.   HENT: Negative for hearing loss, congestion, sore throat, rhinorrhea, dental problem, sinus pressure and tinnitus.   Eyes: Negative for pain, discharge and visual disturbance.  Respiratory: Negative for cough and shortness of breath.   Cardiovascular: Negative for chest pain, palpitations and leg swelling.  Gastrointestinal: Negative for nausea, vomiting, abdominal pain, diarrhea, constipation, blood in stool and abdominal distention.  Genitourinary: Negative for dysuria, urgency, frequency, hematuria, flank pain, vaginal bleeding, vaginal discharge, difficulty urinating, vaginal pain and pelvic pain.  Musculoskeletal: Negative for joint swelling, arthralgias and gait problem. Myalgias: significant nocturnal leg cramping.  Skin: Negative for rash.  Neurological: Negative for dizziness, syncope, speech difficulty, weakness, numbness and headaches.  Hematological: Negative for adenopathy.  Psychiatric/Behavioral: Negative for behavioral problems, dysphoric mood and agitation. The patient is not nervous/anxious.        Objective:   Physical Exam  Constitutional: She is oriented to person, place, and time.  She appears well-developed and well-nourished.  HENT:  Head: Normocephalic.  Right Ear: External ear normal.  Left Ear: External ear normal.  Mouth/Throat: Oropharynx is clear and moist.  Eyes: Conjunctivae and EOM are normal. Pupils are equal, round, and reactive to light.  Neck: Normal range of motion. Neck supple. No thyromegaly present.  Cardiovascular: Normal rate, regular rhythm, normal heart sounds and intact distal pulses.   Pulmonary/Chest: Effort normal and breath sounds normal.  Abdominal: Soft. Bowel sounds are normal. She exhibits no mass. There is no tenderness.  Musculoskeletal: Normal range of motion.  Lymphadenopathy:    She has no cervical adenopathy.  Neurological: She is alert and oriented to person, place, and time.  Skin: Skin is warm and dry. No rash noted.  Psychiatric: She has a normal mood and affect. Her behavior is normal.          Assessment & Plan:   Leg cramps. We'll intensify stretching exercise prior to retiring for the night Diabetes mellitus we'll check a hemoglobin A1c Hypertension stable Chronic kidney disease. We'll check renal indices

## 2011-11-28 ENCOUNTER — Telehealth: Payer: Self-pay

## 2011-11-28 NOTE — Telephone Encounter (Signed)
[facility] - ins wont cover flexeril - they may pay for tizanidine per [facility] Please advise

## 2011-11-29 MED ORDER — TIZANIDINE HCL 4 MG PO TABS: 4.0000 mg | ORAL_TABLET | Freq: Four times a day (QID) | ORAL | Status: AC | PRN

## 2011-11-29 NOTE — Telephone Encounter (Signed)
Spoke with pt- informed of change in med r/t ins coverage - new rx sent to [facility]

## 2011-11-29 NOTE — Telephone Encounter (Signed)
Ok for substitute

## 2012-01-23 ENCOUNTER — Other Ambulatory Visit: Payer: Self-pay

## 2012-01-23 DIAGNOSIS — R52 Pain, unspecified: Secondary | ICD-10-CM

## 2012-01-23 NOTE — Telephone Encounter (Signed)
Ok to RF? 

## 2012-01-23 NOTE — Telephone Encounter (Signed)
Last seen 11/27/11 - last written 912/12 # [id number] Please advise Fax refill request [facility]

## 2012-01-24 MED ORDER — HYDROCODONE-ACETAMINOPHEN 7.5-750 MG PO TABS: 1.0000 | ORAL_TABLET | Freq: Four times a day (QID) | ORAL | Status: DC | PRN

## 2012-01-24 NOTE — Telephone Encounter (Signed)
Called to [facility]. 

## 2012-02-20 ENCOUNTER — Telehealth: Payer: Self-pay | Admitting: Internal Medicine

## 2012-02-20 NOTE — Telephone Encounter (Signed)
Note and change place in specialites comment section of [employer]

## 2012-02-20 NOTE — Telephone Encounter (Signed)
FYI pt no longer get dm supplies from [facility]. Pt will start getting her dm supplies from [facility] [phone]. [facility] will fax over dm request

## 2012-02-25 ENCOUNTER — Ambulatory Visit (INDEPENDENT_AMBULATORY_CARE_PROVIDER_SITE_OTHER): Payer: Medicare PPO | Admitting: Internal Medicine

## 2012-02-25 ENCOUNTER — Encounter: Payer: Self-pay | Admitting: Internal Medicine

## 2012-02-25 DIAGNOSIS — E119 Type 2 diabetes mellitus without complications: Secondary | ICD-10-CM

## 2012-02-25 DIAGNOSIS — N289 Disorder of kidney and ureter, unspecified: Secondary | ICD-10-CM

## 2012-02-25 DIAGNOSIS — E785 Hyperlipidemia, unspecified: Secondary | ICD-10-CM

## 2012-02-25 DIAGNOSIS — I10 Essential (primary) hypertension: Secondary | ICD-10-CM

## 2012-02-25 LAB — BASIC METABOLIC PANEL
CO2: 25 mEq/L (ref 19–32)
Chloride: 106 mEq/L (ref 96–112)
Creatinine, Ser: 1.6 mg/dL — ABNORMAL HIGH (ref 0.4–1.2)
Potassium: 3.7 mEq/L (ref 3.5–5.1)

## 2012-02-25 LAB — HEMOGLOBIN A1C: Hgb A1c MFr Bld: 7.1 % — ABNORMAL HIGH (ref 4.6–6.5)

## 2012-02-25 MED ORDER — INSULIN GLARGINE 100 UNIT/ML ~~LOC~~ SOLN: SUBCUTANEOUS | Status: DC

## 2012-02-25 NOTE — Progress Notes (Signed)
  Subjective:    Patient ID: Sarah [name], female    DOB: 1951-09-21, 61 y.o.   MRN: [id number]  HPI  Wt Readings from Last 3 Encounters:  02/25/12 173 lb (78.472 kg)  11/27/11 165 lb (74.844 kg)  10/04/11 170 lb (77.22 kg)    61 year old patient who is seen today for followup of her insulin requiring diabetes. She states that she has down titrated Lantus insulin to 26 units due to hypoglycemia. Her former dose was up titrated to 40 units due to an elevated hemoglobin A1c and elevated fasting blood sugars. She has been on 26 units for several weeks and she continues to have blood sugars fasting and often in the 60s. She now admits that she was taken Lantus and other insulin inconsistently  in the past. She states that she has been very compliant with Lantus insulin and that she tries to take mealtime insulin before each meal. She feels well today. She states that she has had a recent eye examination She has chronic kidney disease which has been stable  Review of Systems  Constitutional: Negative.   HENT: Negative for hearing loss, congestion, sore throat, rhinorrhea, dental problem, sinus pressure and tinnitus.   Eyes: Negative for pain, discharge and visual disturbance.  Respiratory: Negative for cough and shortness of breath.   Cardiovascular: Negative for chest pain, palpitations and leg swelling.  Gastrointestinal: Negative for nausea, vomiting, abdominal pain, diarrhea, constipation, blood in stool and abdominal distention.  Genitourinary: Negative for dysuria, urgency, frequency, hematuria, flank pain, vaginal bleeding, vaginal discharge, difficulty urinating, vaginal pain and pelvic pain.  Musculoskeletal: Negative for joint swelling, arthralgias and gait problem.  Skin: Negative for rash.  Neurological: Negative for dizziness, syncope, speech difficulty, weakness, numbness and headaches.  Hematological: Negative for adenopathy.  Psychiatric/Behavioral: Negative for behavioral  problems, dysphoric mood and agitation. The patient is not nervous/anxious.        Objective:   Physical Exam  Constitutional: She is oriented to person, place, and time. She appears well-developed and well-nourished.  HENT:  Head: Normocephalic.  Right Ear: External ear normal.  Left Ear: External ear normal.  Mouth/Throat: Oropharynx is clear and moist.  Eyes: Conjunctivae and EOM are normal. Pupils are equal, round, and reactive to light.  Neck: Normal range of motion. Neck supple. No thyromegaly present.  Cardiovascular: Normal rate, regular rhythm, normal heart sounds and intact distal pulses.   Pulmonary/Chest: Effort normal and breath sounds normal.  Abdominal: Soft. Bowel sounds are normal. She exhibits no mass. There is no tenderness.  Musculoskeletal: Normal range of motion.  Lymphadenopathy:    She has no cervical adenopathy.  Neurological: She is alert and oriented to person, place, and time.  Skin: Skin is warm and dry. No rash noted.  Psychiatric: She has a normal mood and affect. Her behavior is normal.          Assessment & Plan:    Diabetes mellitus. Fasting blood sugars are often in the 60s. Will down titrate Lantus further to 20 units at bedtime. Compliance with mealtime insulin stressed. We'll check a hemoglobin A1c Hypertension well controlled Chronic kidney disease. Creatinine 1.8  3 months ago. We'll recheck today in view of decreasing insulin requirements. Nephrology referral if any worsening Dyslipidemia. Continue statin therapy

## 2012-02-25 NOTE — Patient Instructions (Signed)
Limit your sodium (Salt) intake   Please check your hemoglobin A1c every 3 months    It is important that you exercise regularly, at least 20 minutes 3 to 4 times per week.  If you develop chest pain or shortness of breath seek  medical attention.   

## 2012-02-26 LAB — MICROALBUMIN / CREATININE URINE RATIO: Creatinine,U: 84.5 mg/dL

## 2012-04-14 ENCOUNTER — Other Ambulatory Visit: Payer: Self-pay | Admitting: Internal Medicine

## 2012-05-27 ENCOUNTER — Encounter: Payer: Self-pay | Admitting: Internal Medicine

## 2012-05-27 ENCOUNTER — Ambulatory Visit (INDEPENDENT_AMBULATORY_CARE_PROVIDER_SITE_OTHER): Payer: Medicare PPO | Admitting: Internal Medicine

## 2012-05-27 VITALS — BP 128/80 | Temp 97.7°F | Wt 165.0 lb

## 2012-05-27 DIAGNOSIS — I10 Essential (primary) hypertension: Secondary | ICD-10-CM

## 2012-05-27 DIAGNOSIS — N289 Disorder of kidney and ureter, unspecified: Secondary | ICD-10-CM

## 2012-05-27 DIAGNOSIS — R52 Pain, unspecified: Secondary | ICD-10-CM

## 2012-05-27 DIAGNOSIS — E119 Type 2 diabetes mellitus without complications: Secondary | ICD-10-CM

## 2012-05-27 DIAGNOSIS — E785 Hyperlipidemia, unspecified: Secondary | ICD-10-CM

## 2012-05-27 LAB — BASIC METABOLIC PANEL
GFR: 30.04 mL/min — ABNORMAL LOW (ref >60.00)
Glucose, Bld: 112 mg/dL — ABNORMAL HIGH (ref 70–99)
Potassium: 4 mEq/L (ref 3.5–5.1)
Sodium: 140 mEq/L (ref 135–145)

## 2012-05-27 LAB — HEMOGLOBIN A1C: Hgb A1c MFr Bld: 6.8 % — ABNORMAL HIGH (ref 4.6–6.5)

## 2012-05-27 MED ORDER — HYDROCODONE-ACETAMINOPHEN 7.5-750 MG PO TABS: 1.0000 | ORAL_TABLET | Freq: Four times a day (QID) | ORAL | Status: DC | PRN

## 2012-05-27 NOTE — Progress Notes (Signed)
  Subjective:    Patient ID: Sarah [name], female    DOB: 03/17/1951, 61 y.o.   MRN: [id number]  HPI  61 year old patient who is seen today for followup of her type 2 diabetes. One week ago she had a severe hypoglycemic reaction that occurred around 10 PM. EMS was called and a blood sugar was 32. She has been using 40 units of Lantus at bedtime although has been instructed to take 20 units only. Last night at bedtime her blood sugar was 200 and she down titrated Lantus to 26 units and blood sugar fasting this morning 98. She remains on a sliding scale mealtime regimen. She has treated hypertension and chronic kidney disease which has been stable. No other concerns or complaints. Remains on Pravachol for dyslipidemia.    Review of Systems  Constitutional: Negative.   HENT: Negative for hearing loss, congestion, sore throat, rhinorrhea, dental problem, sinus pressure and tinnitus.   Eyes: Negative for pain, discharge and visual disturbance.  Respiratory: Negative for cough and shortness of breath.   Cardiovascular: Negative for chest pain, palpitations and leg swelling.  Gastrointestinal: Negative for nausea, vomiting, abdominal pain, diarrhea, constipation, blood in stool and abdominal distention.  Genitourinary: Negative for dysuria, urgency, frequency, hematuria, flank pain, vaginal bleeding, vaginal discharge, difficulty urinating, vaginal pain and pelvic pain.  Musculoskeletal: Negative for joint swelling, arthralgias and gait problem.  Skin: Negative for rash.  Neurological: Negative for dizziness, syncope, speech difficulty, weakness, numbness and headaches.  Hematological: Negative for adenopathy.  Psychiatric/Behavioral: Negative for behavioral problems, dysphoric mood and agitation. The patient is not nervous/anxious.        Objective:   Physical Exam  Constitutional: She is oriented to person, place, and time. She appears well-developed and well-nourished.  HENT:  Head:  Normocephalic.  Right Ear: External ear normal.  Left Ear: External ear normal.  Mouth/Throat: Oropharynx is clear and moist.  Eyes: Conjunctivae and EOM are normal. Pupils are equal, round, and reactive to light.  Neck: Normal range of motion. Neck supple. No thyromegaly present.  Cardiovascular: Normal rate, regular rhythm, normal heart sounds and intact distal pulses.   Pulmonary/Chest: Effort normal and breath sounds normal.  Abdominal: Soft. Bowel sounds are normal. She exhibits no mass. There is no tenderness.  Musculoskeletal: Normal range of motion.  Lymphadenopathy:    She has no cervical adenopathy.  Neurological: She is alert and oriented to person, place, and time.  Skin: Skin is warm and dry. No rash noted.  Psychiatric: She has a normal mood and affect. Her behavior is normal.          Assessment & Plan:   Diabetes mellitus with symptomatic hypoglycemia. Insulin regimen discussed in detail and written instructions were dispensed. Lantus will be 20 units at bedtime. We'll continue sliding scale mealtime regimen. We'll check a hemoglobin A1c Chronic kidney disease. We'll check creatinine Hypertension well controlled Dyslipidemia stable we'll continue pravastatin  Recheck one month

## 2012-05-27 NOTE — Patient Instructions (Signed)
Limit your sodium (Salt) intake   Please check your hemoglobin A1c every 3 months   

## 2012-06-30 ENCOUNTER — Ambulatory Visit (INDEPENDENT_AMBULATORY_CARE_PROVIDER_SITE_OTHER): Payer: Medicare PPO | Admitting: Internal Medicine

## 2012-06-30 ENCOUNTER — Encounter: Payer: Self-pay | Admitting: Internal Medicine

## 2012-06-30 VITALS — BP 136/84 | Temp 98.4°F | Wt 164.0 lb

## 2012-06-30 DIAGNOSIS — I10 Essential (primary) hypertension: Secondary | ICD-10-CM

## 2012-06-30 DIAGNOSIS — N289 Disorder of kidney and ureter, unspecified: Secondary | ICD-10-CM

## 2012-06-30 DIAGNOSIS — E119 Type 2 diabetes mellitus without complications: Secondary | ICD-10-CM

## 2012-06-30 MED ORDER — INSULIN GLARGINE 100 UNIT/ML ~~LOC~~ SOLN: SUBCUTANEOUS | Status: DC

## 2012-06-30 MED ORDER — INSULIN LISPRO 100 UNIT/ML ~~LOC~~ SOLN: SUBCUTANEOUS | Status: DC

## 2012-06-30 NOTE — Patient Instructions (Signed)
Please check your hemoglobin A1c every 3 months    It is important that you exercise regularly, at least 20 minutes 3 to 4 times per week.  If you develop chest pain or shortness of breath seek  medical attention.  You need to lose weight.  Consider a lower calorie diet and regular exercise. 

## 2012-06-30 NOTE — Progress Notes (Signed)
  Subjective:    Patient ID: Sarah [name], female    DOB: July 07, 1951, 61 y.o.   MRN: [id number]  HPI  61 year old patient who is seen today for followup of her diabetes. She is on multiple insulin injections.  When questioned about insulin injection dosing she really has no clue to how much insulin she is taking. She was down titrator last visit due to a documented hypoglycemia. It seems apparent that she often omits Lantus because of fear of hypoglycemia she cannot recall dosing of Lantus or mealtime insulin. No apparent hypoglycemia    Review of Systems  Constitutional: Negative.   HENT: Negative for hearing loss, congestion, sore throat, rhinorrhea, dental problem, sinus pressure and tinnitus.   Eyes: Negative for pain, discharge and visual disturbance.  Respiratory: Negative for cough and shortness of breath.   Cardiovascular: Negative for chest pain, palpitations and leg swelling.  Gastrointestinal: Negative for nausea, vomiting, abdominal pain, diarrhea, constipation, blood in stool and abdominal distention.  Genitourinary: Negative for dysuria, urgency, frequency, hematuria, flank pain, vaginal bleeding, vaginal discharge, difficulty urinating, vaginal pain and pelvic pain.  Musculoskeletal: Negative for joint swelling, arthralgias and gait problem.  Skin: Negative for rash.  Neurological: Negative for dizziness, syncope, speech difficulty, weakness, numbness and headaches.  Hematological: Negative for adenopathy.  Psychiatric/Behavioral: Negative for behavioral problems, dysphoric mood and agitation. The patient is not nervous/anxious.        Objective:   Physical Exam  Constitutional: She is oriented to person, place, and time. She appears well-developed and well-nourished.  HENT:  Head: Normocephalic.  Right Ear: External ear normal.  Left Ear: External ear normal.  Mouth/Throat: Oropharynx is clear and moist.  Eyes: Conjunctivae and EOM are normal. Pupils are equal, round,  and reactive to light.  Neck: Normal range of motion. Neck supple. No thyromegaly present.  Cardiovascular: Normal rate, regular rhythm, normal heart sounds and intact distal pulses.   Pulmonary/Chest: Effort normal and breath sounds normal.  Abdominal: Soft. Bowel sounds are normal. She exhibits no mass. There is no tenderness.  Musculoskeletal: Normal range of motion.  Lymphadenopathy:    She has no cervical adenopathy.  Neurological: She is alert and oriented to person, place, and time.  Skin: Skin is warm and dry. No rash noted.  Psychiatric: She has a normal mood and affect. Her behavior is normal.          Assessment & Plan:  Diabetes mellitus/noncompliance. Will further down titrate her insulin. Her hemoglobin A1c 1 month ago was 6.8. Compliance issues addressed Hypertension stable

## 2012-08-11 ENCOUNTER — Other Ambulatory Visit: Payer: Self-pay | Admitting: Internal Medicine

## 2012-08-12 ENCOUNTER — Telehealth: Payer: Self-pay | Admitting: Family Medicine

## 2012-08-12 NOTE — Telephone Encounter (Signed)
[facility] Triage Call Report Triage Record Num: [id number] Operator: Albertine Grates Patient Name: Sarah [name] Call Date & Time: 08/11/2012 5:10:43PM Patient Phone: [phone] PCP: Gordy Savers Patient Gender: Female PCP Fax : [phone] Patient DOB: August 29, 1951 [facility] Name: [facility] Reason for Call: Caller: Chenel/Patient; PCP: Eleonore Chiquito (Family Practice); CB#: [phone]; Patient has decided to get scripts for diabetis supplies from [facility] and is calling to let MD know. States can get cheaper. No other needs. Just calling to give "heads up". Protocol(s) Used: Office Note Recommended Outcome per Protocol: Information Noted and Sent to Office Reason for Outcome: Caller information to office Care Advice: ~ 08/27/

## 2012-08-31 ENCOUNTER — Ambulatory Visit (INDEPENDENT_AMBULATORY_CARE_PROVIDER_SITE_OTHER): Payer: Medicare PPO | Admitting: Internal Medicine

## 2012-08-31 ENCOUNTER — Encounter: Payer: Self-pay | Admitting: Internal Medicine

## 2012-08-31 VITALS — BP 120/80 | Temp 97.7°F | Wt 163.0 lb

## 2012-08-31 DIAGNOSIS — E785 Hyperlipidemia, unspecified: Secondary | ICD-10-CM

## 2012-08-31 DIAGNOSIS — N289 Disorder of kidney and ureter, unspecified: Secondary | ICD-10-CM

## 2012-08-31 DIAGNOSIS — I10 Essential (primary) hypertension: Secondary | ICD-10-CM

## 2012-08-31 DIAGNOSIS — Z23 Encounter for immunization: Secondary | ICD-10-CM

## 2012-08-31 DIAGNOSIS — R52 Pain, unspecified: Secondary | ICD-10-CM

## 2012-08-31 DIAGNOSIS — E119 Type 2 diabetes mellitus without complications: Secondary | ICD-10-CM

## 2012-08-31 LAB — BASIC METABOLIC PANEL
CO2: 25 mEq/L (ref 19–32)
Chloride: 108 mEq/L (ref 96–112)
Creatinine, Ser: 2.1 mg/dL — ABNORMAL HIGH (ref 0.4–1.2)
Potassium: 4.6 mEq/L (ref 3.5–5.1)
Sodium: 141 mEq/L (ref 135–145)

## 2012-08-31 LAB — HEMOGLOBIN A1C: Hgb A1c MFr Bld: 6.7 % — ABNORMAL HIGH (ref 4.6–6.5)

## 2012-08-31 MED ORDER — INSULIN LISPRO 100 UNIT/ML ~~LOC~~ SOLN: SUBCUTANEOUS | Status: DC

## 2012-08-31 MED ORDER — AMLODIPINE BESYLATE 10 MG PO TABS: 10.0000 mg | ORAL_TABLET | Freq: Every day | ORAL | Status: DC

## 2012-08-31 MED ORDER — ASPIRIN 81 MG PO TBEC: 81.0000 mg | DELAYED_RELEASE_TABLET | Freq: Every day | ORAL | Status: DC

## 2012-08-31 MED ORDER — HYDROCODONE-ACETAMINOPHEN 7.5-750 MG PO TABS: 1.0000 | ORAL_TABLET | Freq: Four times a day (QID) | ORAL | Status: DC | PRN

## 2012-08-31 MED ORDER — LISINOPRIL 20 MG PO TABS: 20.0000 mg | ORAL_TABLET | Freq: Every day | ORAL | Status: DC

## 2012-08-31 MED ORDER — CYCLOBENZAPRINE HCL 5 MG PO TABS: 5.0000 mg | ORAL_TABLET | Freq: Three times a day (TID) | ORAL | Status: DC | PRN

## 2012-08-31 MED ORDER — PRAVASTATIN SODIUM 40 MG PO TABS: 40.0000 mg | ORAL_TABLET | Freq: Every day | ORAL | Status: DC

## 2012-08-31 MED ORDER — INSULIN GLARGINE 100 UNIT/ML ~~LOC~~ SOLN: SUBCUTANEOUS | Status: DC

## 2012-08-31 NOTE — Progress Notes (Signed)
Subjective:    Patient ID: Sarah [name], female    DOB: 1951-09-01, 61 y.o.   MRN: [id number]  HPI  60 year old patient who is seen today for followup of her type 2 diabetes. She also has chronic kidney disease with a serum creatinine in the range of 2.0. She continues to use OTC anti-inflammatories. His was again discussed. Her diabetes remains under reasonable control. Microalbuminuria has improved from 102-23 approximately 6 months ago. Her last hemoglobin A 1C 6.8.  Past Medical History  Diagnosis Date  . Diabetes mellitus   . Hypertension   . Hyperlipidemia   . Anxiety   . Chronic kidney disease   . Peripheral neuropathy     History   Social History  . Marital Status: Single    Spouse Name: N/A    Number of Children: N/A  . Years of Education: N/A   Occupational History  . Not on file.   Social History Main Topics  . Smoking status: Never Smoker   . Smokeless tobacco: Never Used  . Alcohol Use: Not on file  . Drug Use: Not on file  . Sexually Active: Not on file   Other Topics Concern  . Not on file   Social History Narrative  . No narrative on file    Past Surgical History  Procedure Date  . Tubal ligation   . Hemorrhoid surgery     Family History  Problem Relation Age of Onset  . Cancer Mother   . Hypertension Mother   . Diabetes Mother     Allergies  Allergen Reactions  . Cefaclor     REACTION: unspecified  . Tetanus Toxoid Adsorbed     REACTION: unspecified    Current Outpatient Prescriptions on File Prior to Visit  Medication Sig Dispense Refill  . BESIVANCE 0.6 % SUSP       . Insulin Pen Needle (NOVOFINE) 30G X 8 MM MISC Inject 1 packet into the skin as needed.        Marland Kitchen NEVANAC 0.1 % ophthalmic suspension       . potassium bicarbonate (K-VESCENT) 25 MEQ disintegrating tablet One tablet by mouth in 6 ounces of water daily  90 tablet  6  . DISCONTD: insulin glargine (LANTUS SOLOSTAR) 100 UNIT/ML injection 20 units at bedtime  10 mL  12  .  DISCONTD: insulin lispro (HUMALOG KWIKPEN) 100 UNIT/ML injection 6 units prior to each meal. If blood sugar is in excess of 200 and an additional 4 units      . DISCONTD: lisinopril (PRINIVIL,ZESTRIL) 20 MG tablet Take 1 tablet (20 mg total) by mouth daily.  30 tablet  11  . DISCONTD: pravastatin (PRAVACHOL) 40 MG tablet TAKE 1 TABLET EVERY DAY  90 tablet  3    BP 120/80  Temp 97.7 F (36.5 C) (Oral)  Wt 163 lb (73.936 kg)       Review of Systems  Constitutional: Negative.   HENT: Negative for hearing loss, congestion, sore throat, rhinorrhea, dental problem, sinus pressure and tinnitus.   Eyes: Negative for pain, discharge and visual disturbance.  Respiratory: Negative for cough and shortness of breath.   Cardiovascular: Negative for chest pain, palpitations and leg swelling.  Gastrointestinal: Negative for nausea, vomiting, abdominal pain, diarrhea, constipation, blood in stool and abdominal distention.  Genitourinary: Negative for dysuria, urgency, frequency, hematuria, flank pain, vaginal bleeding, vaginal discharge, difficulty urinating, vaginal pain and pelvic pain.  Musculoskeletal: Negative for joint swelling, arthralgias and gait problem.  Skin: Negative  for rash.  Neurological: Negative for dizziness, syncope, speech difficulty, weakness, numbness and headaches.  Hematological: Negative for adenopathy.  Psychiatric/Behavioral: Negative for behavioral problems, dysphoric mood and agitation. The patient is not nervous/anxious.        Objective:   Physical Exam  Constitutional: She is oriented to person, place, and time. She appears well-developed and well-nourished.  HENT:  Head: Normocephalic.  Right Ear: External ear normal.  Left Ear: External ear normal.  Mouth/Throat: Oropharynx is clear and moist.  Eyes: Conjunctivae normal and EOM are normal. Pupils are equal, round, and reactive to light.  Neck: Normal range of motion. Neck supple. No thyromegaly present.        Transmitted murmur on the right  Cardiovascular: Normal rate, regular rhythm and intact distal pulses.   Murmur heard.      Grade 3/6 systolic murmur loudest at the base  Pulmonary/Chest: Effort normal and breath sounds normal.  Abdominal: Soft. Bowel sounds are normal. She exhibits no mass. There is no tenderness.  Musculoskeletal: Normal range of motion.  Lymphadenopathy:    She has no cervical adenopathy.  Neurological: She is alert and oriented to person, place, and time.  Skin: Skin is warm and dry. No rash noted.  Psychiatric: She has a normal mood and affect. Her behavior is normal.          Assessment & Plan:   Hypertension sotalol for control. Will increase amlodipine to 10 mg daily. Repeat blood pressure 150/80 Diabetes mellitus. Appears to be under reasonable control we'll check a hemoglobin A1c Chronic kidney disease. We'll decrease daily aspirin to 81 mg. Avoid all anti-inflammatory medications again discussed and encouraged. We'll check renal indices today Recheck 3 months. Consider nephrology referral at that time

## 2012-08-31 NOTE — Patient Instructions (Signed)
Decrease aspirin to 81 mg daily Avoid ibuprofen Aleve Advil naproxen and all over-the-counter pain medications except Tylenol   Please check your hemoglobin A1c every 3 months  Limit your sodium (Salt) intake

## 2012-09-02 ENCOUNTER — Other Ambulatory Visit: Payer: Self-pay | Admitting: Internal Medicine

## 2012-09-02 MED ORDER — PRODIGY AUTOCODE BLOOD GLUCOSE DEVI: Status: DC

## 2012-09-02 MED ORDER — PRODIGY TWIST TOP LANCETS 28G MISC: 1.0000 | Freq: Three times a day (TID) | Status: DC

## 2012-09-02 MED ORDER — GLUCOSE BLOOD VI STRP: ORAL_STRIP | Status: DC

## 2012-09-02 NOTE — Telephone Encounter (Signed)
Faxed to [facility].

## 2012-09-02 NOTE — Telephone Encounter (Addendum)
Pt needs new rxs from glucometer(prodigy autocode) and test strips and lancets from [facility] [phone]

## 2012-10-04 ENCOUNTER — Emergency Department (HOSPITAL_COMMUNITY): Payer: Medicare PPO

## 2012-10-04 ENCOUNTER — Encounter (HOSPITAL_COMMUNITY): Payer: Self-pay | Admitting: *Deleted

## 2012-10-04 ENCOUNTER — Emergency Department (HOSPITAL_COMMUNITY)
Admission: EM | Admit: 2012-10-04 | Discharge: 2012-10-04 | Disposition: A | Discharge status: Home / Self Care | Payer: Medicare PPO | Attending: Emergency Medicine | Admitting: Emergency Medicine

## 2012-10-04 ENCOUNTER — Other Ambulatory Visit: Payer: Self-pay

## 2012-10-04 DIAGNOSIS — Z7982 Long term (current) use of aspirin: Secondary | ICD-10-CM | POA: Insufficient documentation

## 2012-10-04 DIAGNOSIS — I129 Hypertensive chronic kidney disease with stage 1 through stage 4 chronic kidney disease, or unspecified chronic kidney disease: Secondary | ICD-10-CM | POA: Insufficient documentation

## 2012-10-04 DIAGNOSIS — R079 Chest pain, unspecified: Secondary | ICD-10-CM | POA: Insufficient documentation

## 2012-10-04 DIAGNOSIS — Y92410 Unspecified street and highway as the place of occurrence of the external cause: Secondary | ICD-10-CM | POA: Insufficient documentation

## 2012-10-04 DIAGNOSIS — M542 Cervicalgia: Secondary | ICD-10-CM | POA: Insufficient documentation

## 2012-10-04 DIAGNOSIS — Z794 Long term (current) use of insulin: Secondary | ICD-10-CM | POA: Insufficient documentation

## 2012-10-04 DIAGNOSIS — E119 Type 2 diabetes mellitus without complications: Secondary | ICD-10-CM | POA: Insufficient documentation

## 2012-10-04 DIAGNOSIS — N189 Chronic kidney disease, unspecified: Secondary | ICD-10-CM | POA: Insufficient documentation

## 2012-10-04 DIAGNOSIS — R51 Headache: Secondary | ICD-10-CM | POA: Insufficient documentation

## 2012-10-04 MED ORDER — AMLODIPINE BESYLATE 10 MG PO TABS
10.0000 mg | ORAL_TABLET | Freq: Every day | ORAL | Status: DC
  Administered 2012-10-04: 10 mg via ORAL
  Filled 2012-10-04: qty 1

## 2012-10-04 MED ORDER — MELOXICAM 15 MG PO TABS: 15.0000 mg | ORAL_TABLET | Freq: Every day | ORAL | Status: DC

## 2012-10-04 NOTE — ED Provider Notes (Signed)
Medical screening examination/treatment/procedure(s) were performed by non-physician practitioner and as supervising physician I was immediately available for consultation/collaboration.  Raeford Razor, MD 10/04/12 1556

## 2012-10-04 NOTE — ED Notes (Signed)
c-collar removed by MD Juleen China
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
## 2012-10-04 NOTE — ED Notes (Signed)
LSB and head blocks removed by 2 RNs, ems and tech. C-collar on and aligned

## 2012-10-06 ENCOUNTER — Encounter: Payer: Self-pay | Admitting: Internal Medicine

## 2012-10-06 ENCOUNTER — Ambulatory Visit (INDEPENDENT_AMBULATORY_CARE_PROVIDER_SITE_OTHER): Payer: Medicare PPO | Admitting: Internal Medicine

## 2012-10-06 VITALS — BP 128/80 | Temp 98.0°F | Wt 158.0 lb

## 2012-10-06 DIAGNOSIS — E119 Type 2 diabetes mellitus without complications: Secondary | ICD-10-CM

## 2012-10-06 DIAGNOSIS — T07XXXA Unspecified multiple injuries, initial encounter: Secondary | ICD-10-CM

## 2012-10-06 DIAGNOSIS — I10 Essential (primary) hypertension: Secondary | ICD-10-CM

## 2012-10-06 DIAGNOSIS — M25562 Pain in left knee: Secondary | ICD-10-CM

## 2012-10-06 DIAGNOSIS — M25569 Pain in unspecified knee: Secondary | ICD-10-CM

## 2012-10-06 MED ORDER — HYDROCODONE-ACETAMINOPHEN 7.5-750 MG PO TABS: 1.0000 | ORAL_TABLET | Freq: Four times a day (QID) | ORAL | Status: DC | PRN

## 2012-10-06 NOTE — Patient Instructions (Signed)
You  may move around, but avoid painful motions and activities.  Apply ice to the sore area for 15 to 20 minutes 3 or 4 times daily for the next two to 3 days.  Return in 3 months for follow-up
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
## 2012-10-09 ENCOUNTER — Telehealth: Payer: Self-pay | Admitting: Internal Medicine

## 2012-10-09 NOTE — Telephone Encounter (Signed)
Spoke with pt- instructed to use ice tid ,aleve bid , if not improvement next week call .
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
## 2012-11-30 ENCOUNTER — Ambulatory Visit: Payer: Medicare PPO | Admitting: Internal Medicine

## 2013-01-06 ENCOUNTER — Ambulatory Visit (INDEPENDENT_AMBULATORY_CARE_PROVIDER_SITE_OTHER): Payer: Medicare HMO | Admitting: Internal Medicine

## 2013-01-06 ENCOUNTER — Encounter: Payer: Self-pay | Admitting: Internal Medicine

## 2013-01-06 VITALS — BP 150/90 | HR 64 | Temp 97.5°F | Resp 20 | Wt 156.0 lb

## 2013-01-06 DIAGNOSIS — E119 Type 2 diabetes mellitus without complications: Secondary | ICD-10-CM

## 2013-01-06 DIAGNOSIS — I10 Essential (primary) hypertension: Secondary | ICD-10-CM

## 2013-01-06 DIAGNOSIS — E785 Hyperlipidemia, unspecified: Secondary | ICD-10-CM

## 2013-01-06 DIAGNOSIS — N289 Disorder of kidney and ureter, unspecified: Secondary | ICD-10-CM

## 2013-01-06 LAB — BASIC METABOLIC PANEL
BUN: 23 mg/dL (ref 6–23)
Calcium: 9.3 mg/dL (ref 8.4–10.5)
GFR: 38.07 mL/min — ABNORMAL LOW (ref >60.00)
Glucose, Bld: 61 mg/dL — ABNORMAL LOW (ref 70–99)

## 2013-01-06 NOTE — Progress Notes (Signed)
Subjective:    Patient ID: Sarah [name], female    DOB: 1951-09-21, 62 y.o.   MRN: [id number]  HPI  62 year old patient who is seen today for followup of type 2 diabetes. She is on basal and mealtime insulin. In addition she has treated hypertension and dyslipidemia. She is doing quite well today. She does have a history of chronic kidney disease.  Past Medical History  Diagnosis Date  . Diabetes mellitus   . Hypertension   . Hyperlipidemia   . Anxiety   . Chronic kidney disease   . Peripheral neuropathy     History   Social History  . Marital Status: Single    Spouse Name: N/A    Number of Children: N/A  . Years of Education: N/A   Occupational History  . Not on file.   Social History Main Topics  . Smoking status: Never Smoker   . Smokeless tobacco: Never Used  . Alcohol Use: No  . Drug Use: No  . Sexually Active: Not on file   Other Topics Concern  . Not on file   Social History Narrative  . No narrative on file    Past Surgical History  Procedure Date  . Tubal ligation   . Hemorrhoid surgery   . Neck surgery   . Cataract extraction, bilateral     Family History  Problem Relation Age of Onset  . Cancer Mother   . Hypertension Mother   . Diabetes Mother     Allergies  Allergen Reactions  . Cefaclor Hives  . Tetanus Toxoid Adsorbed Swelling    Current Outpatient Prescriptions on File Prior to Visit  Medication Sig Dispense Refill  . amLODipine (NORVASC) 10 MG tablet Take 1 tablet (10 mg total) by mouth daily.  90 tablet  3  . aspirin (ASPIRIN EC) 81 MG EC tablet Take 1 tablet (81 mg total) by mouth daily. Swallow whole.  30 tablet  12  . Blood Glucose Monitoring Suppl (PRODIGY AUTOCODE BLOOD GLUCOSE) DEVI As directed qid  1 each  0  . Difluprednate (DUREZOL) 0.05 % EMUL Place 1 drop into the left eye 2 (two) times daily.      Marland Kitchen glucose blood (PRODIGY TEST) test strip Dx code 250.00  400 each  3  . HYDROcodone-acetaminophen (VICODIN ES) 7.5-750 MG  per tablet Take 1 tablet by mouth every 6 (six) hours as needed for pain.  60 tablet  2  . insulin glargine (LANTUS) 100 UNIT/ML injection Inject 20 Units into the skin at bedtime. 20 units at bedtime      . insulin lispro (HUMALOG) 100 UNIT/ML injection Inject 6 Units into the skin 3 (three) times daily before meals. 6 units prior to each meal. If blood sugar is in excess of 200 and an additional 4 units      . Insulin Pen Needle (NOVOFINE) 30G X 8 MM MISC Inject 1 packet into the skin as needed.        Marland Kitchen lisinopril (PRINIVIL,ZESTRIL) 20 MG tablet Take 1 tablet (20 mg total) by mouth daily.  30 tablet  11  . meloxicam (MOBIC) 15 MG tablet Take 1 tablet (15 mg total) by mouth daily.  10 tablet  0  . NEVANAC 0.1 % ophthalmic suspension Place 1 drop into the right eye 3 (three) times daily.       . potassium bicarbonate (K-LYTE) 25 MEQ disintegrating tablet Take 25 mEq by mouth daily. One tablet by mouth in 6 ounces of water  daily      . pravastatin (PRAVACHOL) 40 MG tablet Take 1 tablet (40 mg total) by mouth daily.  90 tablet  3  . PRODIGY TWIST TOP LANCETS 28G MISC 1 each by Does not apply route 4 (four) times daily -  with meals and at bedtime.  400 each  3    BP 150/90  Pulse 64  Temp 97.5 F (36.4 C) (Oral)  Resp 20  Wt 156 lb (70.761 kg)  SpO2 99%       Review of Systems  Constitutional: Negative for fever, appetite change, fatigue and unexpected weight change.  HENT: Negative for hearing loss, ear pain, nosebleeds, congestion, sore throat, mouth sores, trouble swallowing, neck stiffness, dental problem, voice change, sinus pressure and tinnitus.   Eyes: Negative for photophobia, pain, redness and visual disturbance.  Respiratory: Negative for cough, chest tightness and shortness of breath.   Cardiovascular: Negative for chest pain, palpitations and leg swelling.  Gastrointestinal: Negative for nausea, vomiting, abdominal pain, diarrhea, constipation, blood in stool, abdominal  distention and rectal pain.  Genitourinary: Negative for dysuria, urgency, frequency, hematuria, flank pain, vaginal bleeding, vaginal discharge, difficulty urinating, genital sores, vaginal pain, menstrual problem and pelvic pain.  Musculoskeletal: Negative for back pain and arthralgias.  Skin: Negative for rash.  Neurological: Negative for dizziness, syncope, speech difficulty, weakness, light-headedness, numbness and headaches.  Hematological: Negative for adenopathy. Does not bruise/bleed easily.  Psychiatric/Behavioral: Negative for suicidal ideas, behavioral problems, self-injury, dysphoric mood and agitation. The patient is not nervous/anxious.        Objective:   Physical Exam  Constitutional: She is oriented to person, place, and time. She appears well-developed and well-nourished.       Blood pressure 130/80  HENT:  Head: Normocephalic.  Right Ear: External ear normal.  Left Ear: External ear normal.  Mouth/Throat: Oropharynx is clear and moist.  Eyes: Conjunctivae normal and EOM are normal. Pupils are equal, round, and reactive to light.  Neck: Normal range of motion. Neck supple. No thyromegaly present.  Cardiovascular: Normal rate, regular rhythm, normal heart sounds and intact distal pulses.   Pulmonary/Chest: Effort normal and breath sounds normal.  Abdominal: Soft. Bowel sounds are normal. She exhibits no mass. There is no tenderness.  Musculoskeletal: Normal range of motion.  Lymphadenopathy:    She has no cervical adenopathy.  Neurological: She is alert and oriented to person, place, and time.  Skin: Skin is warm and dry. No rash noted.  Psychiatric: She has a normal mood and affect. Her behavior is normal.          Assessment & Plan:   Diabetes mellitus. Will check a hemoglobin A1c  Hypertension - compliance stressed we'll continue present regimen BP Readings from Last 3 Encounters:  01/06/13 150/90  10/06/12 128/80  10/04/12 175/65    Chronic Kidney  disease-  Will check a BMet-  May need Nephrology referral  Return office visit 3 months

## 2013-01-06 NOTE — Patient Instructions (Addendum)
Please check your hemoglobin A1c every 3 months  Limit your sodium (Salt) intake    It is important that you exercise regularly, at least 20 minutes 3 to 4 times per week.  If you develop chest pain or shortness of breath seek  medical attention.  Nephrology consultation

## 2013-01-19 ENCOUNTER — Telehealth: Payer: Self-pay | Admitting: Internal Medicine

## 2013-01-19 NOTE — Telephone Encounter (Signed)
Patient Information:  Caller Name: Charisma  Phone: [phone]  Patient: Sarah [name], Sarah [name]  Gender: Female  DOB: 03-23-1951  Age: 62 Years  PCP: Eleonore Chiquito [facility])  Office Follow Up:  Does the office need to follow up with this patient?: Yes  Instructions For The Office: Patient wants Rx instead of appt please contact patient krs/can  RN Note:  States general joint pain and some finger swelling noted one week ago.  Seen by surgeon in follow up to foot surgery and was told to call PCP office for gout flare up.  Per protocol, advised and offered appt in office; patient declines, as feet are too painful to go to office.  Patient requesting Rx instead of appt; uses [facility].  May reach patient at [phone].  krs/can  Symptoms  Reason For Call & Symptoms: foot pain; history of gout.  Onset of pain in fingers, elbow, knee and foot 01/18/13  Reviewed Health History In EMR: Yes  Reviewed Medications In EMR: Yes  Reviewed Allergies In EMR: Yes  Reviewed Surgeries / Procedures: Yes  Date of Onset of Symptoms: 01/12/2013  Guideline(s) Used:  Foot Pain  Disposition Per Guideline:   See Within 3 Days in Office  Reason For Disposition Reached:   Moderate pain (e.g., interferes with normal activities, limping) and present > 3 days  Advice Given:  N/A  Appointment Scheduled:  01/20/2013 10:45:00 Appointment Scheduled Provider:  Adline Mango [facility])

## 2013-01-20 ENCOUNTER — Ambulatory Visit (INDEPENDENT_AMBULATORY_CARE_PROVIDER_SITE_OTHER): Payer: Medicare HMO | Admitting: Family

## 2013-01-20 ENCOUNTER — Encounter: Payer: Self-pay | Admitting: Family

## 2013-01-20 VITALS — BP 150/70 | HR 61 | Temp 97.9°F | Wt 156.0 lb

## 2013-01-20 DIAGNOSIS — M109 Gout, unspecified: Secondary | ICD-10-CM

## 2013-01-20 DIAGNOSIS — M2550 Pain in unspecified joint: Secondary | ICD-10-CM

## 2013-01-20 MED ORDER — INDOMETHACIN 50 MG PO CAPS: 50.0000 mg | ORAL_CAPSULE | Freq: Three times a day (TID) | ORAL | Status: DC

## 2013-01-20 MED ORDER — HYDROCODONE-ACETAMINOPHEN 10-325 MG PO TABS: 0.5000 | ORAL_TABLET | Freq: Three times a day (TID) | ORAL | Status: DC | PRN

## 2013-01-20 MED ORDER — METHYLPREDNISOLONE ACETATE 40 MG/ML IJ SUSP
80.0000 mg | Freq: Once | INTRAMUSCULAR | Status: AC
  Administered 2013-01-20: 80 mg via INTRAMUSCULAR

## 2013-01-20 NOTE — Progress Notes (Signed)
Subjective:    Patient ID: Sarah [name], female    DOB: 08-Mar-1951, 62 y.o.   MRN: [id number]  HPI 62 year old African American female, nonsmoker, patient of Dr. Kirtland Bouchard. is in today with complaints of a gout flare in her right thumb left knee and right elbow ongoing x one week. Patient reports being seen at an urgent care clinic who prescribed prednisone neck also hallucinating. She was instructed to stop prednisone by Dr. can continue her pain medication. She continues to have swelling, redness, and pain in her weight elbow, left knee and right thumb. Rates the pain as 7/10. Worse to touch and with movement.   Review of Systems  Constitutional: Negative.   Respiratory: Negative.   Cardiovascular: Negative.   Gastrointestinal: Negative.   Musculoskeletal: Positive for arthralgias and gait problem.       Pain and swelling to her right elbow, left knee, right thumb.  Skin: Negative.   Neurological: Negative.   Hematological: Negative.   Psychiatric/Behavioral: Negative.    Past Medical History  Diagnosis Date  . Diabetes mellitus   . Hypertension   . Hyperlipidemia   . Anxiety   . Chronic kidney disease   . Peripheral neuropathy     History   Social History  . Marital Status: Single    Spouse Name: N/A    Number of Children: N/A  . Years of Education: N/A   Occupational History  . Not on file.   Social History Main Topics  . Smoking status: Never Smoker   . Smokeless tobacco: Never Used  . Alcohol Use: No  . Drug Use: No  . Sexually Active: Not on file   Other Topics Concern  . Not on file   Social History Narrative  . No narrative on file    Past Surgical History  Procedure Date  . Tubal ligation   . Hemorrhoid surgery   . Neck surgery   . Cataract extraction, bilateral     Family History  Problem Relation Age of Onset  . Cancer Mother   . Hypertension Mother   . Diabetes Mother     Allergies  Allergen Reactions  . Cefaclor Hives  . Tetanus Toxoid  Adsorbed Swelling    Current Outpatient Prescriptions on File Prior to Visit  Medication Sig Dispense Refill  . amLODipine (NORVASC) 10 MG tablet Take 1 tablet (10 mg total) by mouth daily.  90 tablet  3  . aspirin (ASPIRIN EC) 81 MG EC tablet Take 1 tablet (81 mg total) by mouth daily. Swallow whole.  30 tablet  12  . Blood Glucose Monitoring Suppl (PRODIGY AUTOCODE BLOOD GLUCOSE) DEVI As directed qid  1 each  0  . cyclobenzaprine (FLEXERIL) 5 MG tablet       . Difluprednate (DUREZOL) 0.05 % EMUL Place 1 drop into the left eye 2 (two) times daily.      Marland Kitchen glucose blood (PRODIGY TEST) test strip Dx code 250.00  400 each  3  . insulin glargine (LANTUS) 100 UNIT/ML injection Inject 20 Units into the skin at bedtime. 20 units at bedtime      . insulin lispro (HUMALOG) 100 UNIT/ML injection Inject 6 Units into the skin 3 (three) times daily before meals. 6 units prior to each meal. If blood sugar is in excess of 200 and an additional 4 units      . Insulin Pen Needle (NOVOFINE) 30G X 8 MM MISC Inject 1 packet into the skin as needed.        Marland Kitchen  lisinopril (PRINIVIL,ZESTRIL) 20 MG tablet Take 1 tablet (20 mg total) by mouth daily.  30 tablet  11  . meloxicam (MOBIC) 15 MG tablet Take 1 tablet (15 mg total) by mouth daily.  10 tablet  0  . NEVANAC 0.1 % ophthalmic suspension Place 1 drop into the right eye 3 (three) times daily.       . potassium bicarbonate (K-LYTE) 25 MEQ disintegrating tablet Take 25 mEq by mouth daily. One tablet by mouth in 6 ounces of water daily      . pravastatin (PRAVACHOL) 40 MG tablet Take 1 tablet (40 mg total) by mouth daily.  90 tablet  3  . PRODIGY TWIST TOP LANCETS 28G MISC 1 each by Does not apply route 4 (four) times daily -  with meals and at bedtime.  400 each  3   No current facility-administered medications on file prior to visit.    BP 150/70  Pulse 61  Temp 97.9 F (36.6 C) (Oral)  Wt 156 lb (70.761 kg)  SpO2 98%chart Objective:   Physical Exam   Constitutional: She is oriented to person, place, and time. She appears well-developed and well-nourished.  Neck: Normal range of motion. Neck supple.  Cardiovascular: Normal rate, regular rhythm and normal heart sounds.   Pulmonary/Chest: Effort normal and breath sounds normal.  Abdominal: Soft. Bowel sounds are normal.  Neurological: She is alert and oriented to person, place, and time.  Skin: Skin is warm and dry. There is erythema.       Erythema, swelling, and tenderness noted to palpation of the right elbow, left knee, and right thumb.  Psychiatric: She has a normal mood and affect.          Assessment & Plan:  Assessment:  1.Multiple joint pain  2. Gout flare,  rule out rheumatoid arthritis   Plan: Since patient has multiple joint pain today with her RA factor to be sure she doesn't have rheumatoid arthritis. Depo-Medrol 80 mg IM x1 given. Indocin 50 mg one tablet 3 times a day with food. Since the 7-1/2 mg hydrocodone is no longer available I have rewritten her prescription for hydrocodone to take one half to one tablet 3 times a day as needed. We'll follow with patient in the results of her labs and as needed sooneIn r.

## 2013-04-07 ENCOUNTER — Ambulatory Visit (INDEPENDENT_AMBULATORY_CARE_PROVIDER_SITE_OTHER): Payer: Medicare HMO | Admitting: Internal Medicine

## 2013-04-07 ENCOUNTER — Encounter: Payer: Self-pay | Admitting: Internal Medicine

## 2013-04-07 VITALS — BP 140/80 | HR 63 | Temp 97.9°F | Resp 20 | Wt 154.0 lb

## 2013-04-07 DIAGNOSIS — N289 Disorder of kidney and ureter, unspecified: Secondary | ICD-10-CM

## 2013-04-07 DIAGNOSIS — Z8679 Personal history of other diseases of the circulatory system: Secondary | ICD-10-CM

## 2013-04-07 DIAGNOSIS — E119 Type 2 diabetes mellitus without complications: Secondary | ICD-10-CM

## 2013-04-07 DIAGNOSIS — I10 Essential (primary) hypertension: Secondary | ICD-10-CM

## 2013-04-07 LAB — BASIC METABOLIC PANEL
BUN: 23 mg/dL (ref 6–23)
Calcium: 9.2 mg/dL (ref 8.4–10.5)
GFR: 38.81 mL/min — ABNORMAL LOW (ref >60.00)
Glucose, Bld: 187 mg/dL — ABNORMAL HIGH (ref 70–99)
Potassium: 4.3 mEq/L (ref 3.5–5.1)

## 2013-04-07 MED ORDER — LORAZEPAM 0.5 MG PO TABS: 0.5000 mg | ORAL_TABLET | Freq: Two times a day (BID) | ORAL | Status: DC | PRN

## 2013-04-07 MED ORDER — ALLOPURINOL 100 MG PO TABS: 100.0000 mg | ORAL_TABLET | Freq: Every day | ORAL | Status: DC

## 2013-04-07 MED ORDER — HYDROCODONE-ACETAMINOPHEN 10-325 MG PO TABS: 0.5000 | ORAL_TABLET | Freq: Three times a day (TID) | ORAL | Status: DC | PRN
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
## 2013-04-07 NOTE — Patient Instructions (Signed)
Hold aspirin therapy at this time until cleared by Dr. Luciana Axe   Please check your hemoglobin A1c every 3 months  Limit your sodium (Salt) intake  Do not use anti-inflammatory medications such as ibuprofen Advil or Aleve;  discontinue meloxicam and indomethacin

## 2013-05-05 ENCOUNTER — Other Ambulatory Visit: Payer: Self-pay | Admitting: Internal Medicine

## 2013-07-07 ENCOUNTER — Ambulatory Visit (INDEPENDENT_AMBULATORY_CARE_PROVIDER_SITE_OTHER): Payer: Medicare HMO | Admitting: Internal Medicine

## 2013-07-07 ENCOUNTER — Encounter: Payer: Self-pay | Admitting: Internal Medicine

## 2013-07-07 VITALS — BP 140/72 | HR 66 | Temp 97.8°F | Resp 20 | Wt 157.0 lb

## 2013-07-07 DIAGNOSIS — I10 Essential (primary) hypertension: Secondary | ICD-10-CM

## 2013-07-07 DIAGNOSIS — N185 Chronic kidney disease, stage 5: Secondary | ICD-10-CM

## 2013-07-07 DIAGNOSIS — E119 Type 2 diabetes mellitus without complications: Secondary | ICD-10-CM

## 2013-07-07 DIAGNOSIS — E785 Hyperlipidemia, unspecified: Secondary | ICD-10-CM

## 2013-07-07 LAB — BASIC METABOLIC PANEL
BUN: 23 mg/dL (ref 6–23)
Chloride: 108 mEq/L (ref 96–112)
Potassium: 3.6 mEq/L (ref 3.5–5.1)

## 2013-07-07 LAB — MICROALBUMIN / CREATININE URINE RATIO: Creatinine,U: 327.7 mg/dL

## 2013-07-07 MED ORDER — LORAZEPAM 0.5 MG PO TABS: 0.5000 mg | ORAL_TABLET | Freq: Two times a day (BID) | ORAL | Status: DC | PRN

## 2013-07-07 MED ORDER — CYCLOBENZAPRINE HCL 5 MG PO TABS: 5.0000 mg | ORAL_TABLET | Freq: Three times a day (TID) | ORAL | Status: DC | PRN

## 2013-07-07 MED ORDER — HYDROCODONE-ACETAMINOPHEN 10-325 MG PO TABS: 0.5000 | ORAL_TABLET | Freq: Three times a day (TID) | ORAL | Status: DC | PRN

## 2013-07-07 NOTE — Patient Instructions (Signed)
Please check your hemoglobin A1c every 3 months  Limit your sodium (Salt) intake   Please check your hemoglobin A1c every 3 months

## 2013-07-07 NOTE — Progress Notes (Signed)
Subjective:    Patient ID: Sarah [name], female    DOB: Aug 17, 1951, 62 y.o.   MRN: [id number]  HPI  62 year old patient who is seen today for followup. She has diabetes and chronic kidney disease hypertension and dyslipidemia. She is doing quite well. Since her last visit here she has had eye surgery and has done quite well. She has chronic kidney disease and has been off the anti-inflammatory medications for least one year. Creatinine is fairly stable at 1.7. She was referred to renal medicine last visit but she wishes to defer at this time  Past Medical History  Diagnosis Date  . Diabetes mellitus   . Hypertension   . Hyperlipidemia   . Anxiety   . Chronic kidney disease   . Peripheral neuropathy     History   Social History  . Marital Status: Single    Spouse Name: N/A    Number of Children: N/A  . Years of Education: N/A   Occupational History  . Not on file.   Social History Main Topics  . Smoking status: Never Smoker   . Smokeless tobacco: Never Used  . Alcohol Use: No  . Drug Use: No  . Sexually Active: Not on file   Other Topics Concern  . Not on file   Social History Narrative  . No narrative on file    Past Surgical History  Procedure Laterality Date  . Tubal ligation    . Hemorrhoid surgery    . Neck surgery    . Cataract extraction, bilateral      Family History  Problem Relation Age of Onset  . Cancer Mother   . Hypertension Mother   . Diabetes Mother     Allergies  Allergen Reactions  . Cefaclor Hives  . Tetanus Toxoid Adsorbed Swelling    Current Outpatient Prescriptions on File Prior to Visit  Medication Sig Dispense Refill  . allopurinol (ZYLOPRIM) 100 MG tablet Take 1 tablet (100 mg total) by mouth daily.  30 tablet  6  . amLODipine (NORVASC) 10 MG tablet Take 1 tablet (10 mg total) by mouth daily.  90 tablet  3  . aspirin (ASPIRIN EC) 81 MG EC tablet Take 1 tablet (81 mg total) by mouth daily. Swallow whole.  30 tablet  12  .  Blood Glucose Monitoring Suppl (PRODIGY AUTOCODE BLOOD GLUCOSE) DEVI As directed qid  1 each  0  . cyclobenzaprine (FLEXERIL) 5 MG tablet       . Difluprednate (DUREZOL) 0.05 % EMUL Place 1 drop into the left eye 2 (two) times daily.      Marland Kitchen glucose blood (PRODIGY TEST) test strip Dx code 250.00  400 each  3  . HUMALOG KWIKPEN 100 UNIT/ML SOPN INJECT 10-16 UNITS INTO THE SKIN 3 (THREE) TIMES DAILY BEFORE MEALS.  5 pen  1  . insulin glargine (LANTUS) 100 UNIT/ML injection Inject 20 Units into the skin at bedtime. 20 units at bedtime      . insulin lispro (HUMALOG) 100 UNIT/ML injection Inject 6 Units into the skin 3 (three) times daily before meals. 6 units prior to each meal. If blood sugar is in excess of 200 and an additional 4 units      . Insulin Pen Needle (NOVOFINE) 30G X 8 MM MISC Inject 1 packet into the skin as needed.        Marland Kitchen lisinopril (PRINIVIL,ZESTRIL) 20 MG tablet Take 1 tablet (20 mg total) by mouth daily.  30 tablet  11  . NEVANAC 0.1 % ophthalmic suspension Place 1 drop into the right eye 3 (three) times daily.       . potassium bicarbonate (K-LYTE) 25 MEQ disintegrating tablet Take 25 mEq by mouth daily. One tablet by mouth in 6 ounces of water daily      . pravastatin (PRAVACHOL) 40 MG tablet Take 1 tablet (40 mg total) by mouth daily.  90 tablet  3  . PRODIGY TWIST TOP LANCETS 28G MISC 1 each by Does not apply route 4 (four) times daily -  with meals and at bedtime.  400 each  3   No current facility-administered medications on file prior to visit.    BP 140/72  Pulse 66  Temp(Src) 97.8 F (36.6 C) (Oral)  Resp 20  Wt 157 lb (71.215 kg)  SpO2 98%       Review of Systems  Constitutional: Negative.   HENT: Negative for hearing loss, congestion, sore throat, rhinorrhea, dental problem, sinus pressure and tinnitus.   Eyes: Positive for visual disturbance. Negative for pain and discharge.  Respiratory: Negative for cough and shortness of breath.   Cardiovascular:  Negative for chest pain, palpitations and leg swelling.  Gastrointestinal: Negative for nausea, vomiting, abdominal pain, diarrhea, constipation, blood in stool and abdominal distention.  Genitourinary: Negative for dysuria, urgency, frequency, hematuria, flank pain, vaginal bleeding, vaginal discharge, difficulty urinating, vaginal pain and pelvic pain.  Musculoskeletal: Negative for joint swelling, arthralgias and gait problem.  Skin: Negative for rash.  Neurological: Negative for dizziness, syncope, speech difficulty, weakness, numbness and headaches.  Hematological: Negative for adenopathy.  Psychiatric/Behavioral: Negative for behavioral problems, dysphoric mood and agitation. The patient is not nervous/anxious.        Objective:   Physical Exam  Constitutional: She is oriented to person, place, and time. She appears well-developed and well-nourished.  Pressure 130/70  HENT:  Head: Normocephalic.  Right Ear: External ear normal.  Left Ear: External ear normal.  Mouth/Throat: Oropharynx is clear and moist.  Eyes: Conjunctivae and EOM are normal. Pupils are equal, round, and reactive to light.  Neck: Normal range of motion. Neck supple. No thyromegaly present.  Cardiovascular: Normal rate, regular rhythm and intact distal pulses.   Murmur heard. Grade 3/6 systolic murmur  Pulmonary/Chest: Effort normal and breath sounds normal.  Abdominal: Soft. Bowel sounds are normal. She exhibits no mass. There is no tenderness.  Musculoskeletal: Normal range of motion.  Lymphadenopathy:    She has no cervical adenopathy.  Neurological: She is alert and oriented to person, place, and time.  Skin: Skin is warm and dry. No rash noted.  Psychiatric: She has a normal mood and affect. Her behavior is normal.          Assessment & Plan:   Diabetes mellitus. Will check a hemoglobin A1c Hypertension well controlled Dyslipidemia Diabetic retinopathy. Followup ophthalmology Chronic kidney  disease. Will check indices as well as a urine for microalbumin

## 2013-07-12 ENCOUNTER — Telehealth: Payer: Self-pay

## 2013-07-12 DIAGNOSIS — H4310 Vitreous hemorrhage, unspecified eye: Secondary | ICD-10-CM

## 2013-07-12 NOTE — Addendum Note (Signed)
Addended by: Kern Reap B on: 07/12/2013 02:15 PM   Modules accepted: Orders

## 2013-07-12 NOTE — Telephone Encounter (Signed)
Referral placed.

## 2013-07-12 NOTE — Telephone Encounter (Signed)
Okay per Dr Kirtland Bouchard

## 2013-07-12 NOTE — Telephone Encounter (Signed)
Spoke with Crystal. Pt has been seeing [facility], Dr. Fawn Kirk since March and insurance is now denying claim for surgery (Vitreous Hemorrhage) done in June stating that pt needs a referral. Crystal asks if a retro referral can be done for [employer]. Pt's dx is Diabetic retinopathy [phone] fax [phone]

## 2013-07-12 NOTE — Telephone Encounter (Signed)
Forwarded to Dr. Kirtland Bouchard and Lupita Leash for review

## 2013-08-18 ENCOUNTER — Other Ambulatory Visit: Payer: Self-pay | Admitting: Internal Medicine

## 2013-09-03 ENCOUNTER — Other Ambulatory Visit: Payer: Self-pay | Admitting: Internal Medicine

## 2013-10-08 ENCOUNTER — Ambulatory Visit (INDEPENDENT_AMBULATORY_CARE_PROVIDER_SITE_OTHER): Payer: Medicare HMO | Admitting: Internal Medicine

## 2013-10-08 ENCOUNTER — Encounter: Payer: Self-pay | Admitting: Internal Medicine

## 2013-10-08 VITALS — BP 122/74 | HR 71 | Temp 97.5°F | Resp 20 | Wt 163.0 lb

## 2013-10-08 DIAGNOSIS — Z23 Encounter for immunization: Secondary | ICD-10-CM

## 2013-10-08 DIAGNOSIS — I10 Essential (primary) hypertension: Secondary | ICD-10-CM

## 2013-10-08 DIAGNOSIS — N289 Disorder of kidney and ureter, unspecified: Secondary | ICD-10-CM

## 2013-10-08 DIAGNOSIS — E119 Type 2 diabetes mellitus without complications: Secondary | ICD-10-CM

## 2013-10-08 LAB — BASIC METABOLIC PANEL
CO2: 23 mEq/L (ref 19–32)
Chloride: 104 mEq/L (ref 96–112)
Creatinine, Ser: 2.2 mg/dL — ABNORMAL HIGH (ref 0.4–1.2)
Potassium: 4.1 mEq/L (ref 3.5–5.1)

## 2013-10-08 LAB — HEMOGLOBIN A1C: Hgb A1c MFr Bld: 6.7 % — ABNORMAL HIGH (ref 4.6–6.5)

## 2013-10-08 MED ORDER — HYDROCODONE-ACETAMINOPHEN 10-325 MG PO TABS: 0.5000 | ORAL_TABLET | Freq: Three times a day (TID) | ORAL | Status: DC | PRN

## 2013-10-08 NOTE — Progress Notes (Signed)
Subjective:    Patient ID: Sarah [name], female    DOB: 05-Jul-1951, 62 y.o.   MRN: [id number]  HPI  62 year old patient who has type 2 diabetes which has been well-controlled. Her last hemoglobin A1c 6.8. She has chronic kidney disease that has been fairly stable. She has been resistant to referral for nephrology followup. She has treated hypertension and dyslipidemia. She also has a history of cerebrovascular disease. Denies any focal neurological symptoms. Denies any active cardiopulmonary complaints. Except for stress she has really no complaints today. There's been some modest weight gain since her last visit.  Past Medical History  Diagnosis Date  . Diabetes mellitus   . Hypertension   . Hyperlipidemia   . Anxiety   . Chronic kidney disease   . Peripheral neuropathy     History   Social History  . Marital Status: Single    Spouse Name: N/A    Number of Children: N/A  . Years of Education: N/A   Occupational History  . Not on file.   Social History Main Topics  . Smoking status: Never Smoker   . Smokeless tobacco: Never Used  . Alcohol Use: No  . Drug Use: No  . Sexual Activity: Not on file   Other Topics Concern  . Not on file   Social History Narrative  . No narrative on file    Past Surgical History  Procedure Laterality Date  . Tubal ligation    . Hemorrhoid surgery    . Neck surgery    . Cataract extraction, bilateral      Family History  Problem Relation Age of Onset  . Cancer Mother   . Hypertension Mother   . Diabetes Mother     Allergies  Allergen Reactions  . Cefaclor Hives  . Tetanus Toxoid Adsorbed Swelling    Current Outpatient Prescriptions on File Prior to Visit  Medication Sig Dispense Refill  . allopurinol (ZYLOPRIM) 100 MG tablet Take 1 tablet (100 mg total) by mouth daily.  30 tablet  6  . amLODipine (NORVASC) 10 MG tablet Take 1 tablet (10 mg total) by mouth daily.  90 tablet  3  . aspirin (ASPIRIN EC) 81 MG EC tablet Take 1  tablet (81 mg total) by mouth daily. Swallow whole.  30 tablet  12  . Blood Glucose Monitoring Suppl (PRODIGY AUTOCODE BLOOD GLUCOSE) DEVI As directed qid  1 each  0  . COLCRYS 0.6 MG tablet Take 0.6 mg by mouth daily.       . cyclobenzaprine (FLEXERIL) 5 MG tablet Take 1 tablet (5 mg total) by mouth 3 (three) times daily as needed for muscle spasms.  30 tablet  5  . Difluprednate (DUREZOL) 0.05 % EMUL Place 1 drop into the left eye 2 (two) times daily.      Marland Kitchen HUMALOG KWIKPEN 100 UNIT/ML SOPN INJECT 10-16 UNITS INTO THE SKIN 3 (THREE) TIMES DAILY BEFORE MEALS.  15 pen  2  . insulin glargine (LANTUS) 100 UNIT/ML injection Inject 20 Units into the skin at bedtime. 20 units at bedtime      . insulin lispro (HUMALOG) 100 UNIT/ML injection Inject 6 Units into the skin 3 (three) times daily before meals. 6 units prior to each meal. If blood sugar is in excess of 200 and an additional 4 units      . Insulin Pen Needle (NOVOFINE) 30G X 8 MM MISC Inject 1 packet into the skin as needed.        Marland Kitchen  LORazepam (ATIVAN) 0.5 MG tablet Take 1 tablet (0.5 mg total) by mouth 2 (two) times daily as needed for anxiety.  90 tablet  1  . NEVANAC 0.1 % ophthalmic suspension Place 1 drop into the right eye 3 (three) times daily.       . potassium bicarbonate (K-LYTE) 25 MEQ disintegrating tablet Take 25 mEq by mouth daily. One tablet by mouth in 6 ounces of water daily      . pravastatin (PRAVACHOL) 40 MG tablet Take 1 tablet (40 mg total) by mouth daily.  90 tablet  3  . PRODIGY NO CODING BLOOD GLUC test strip TEST  SUGARS BEFORE MEALS  AND AT BEDTIME  400 each  3  . PRODIGY TWIST TOP LANCETS 28G MISC 1 each by Does not apply route 4 (four) times daily -  with meals and at bedtime.  400 each  3  . lisinopril (PRINIVIL,ZESTRIL) 20 MG tablet Take 1 tablet (20 mg total) by mouth daily.  30 tablet  11   No current facility-administered medications on file prior to visit.    BP 122/74  Pulse 71  Temp(Src) 97.5 F (36.4 C)  (Oral)  Resp 20  Wt 163 lb (73.936 kg)  SpO2 98%        Review of Systems  Constitutional: Negative.   HENT: Negative for congestion, dental problem, hearing loss, rhinorrhea, sinus pressure, sore throat and tinnitus.   Eyes: Negative for pain, discharge and visual disturbance.  Respiratory: Negative for cough and shortness of breath.   Cardiovascular: Negative for chest pain, palpitations and leg swelling.  Gastrointestinal: Negative for nausea, vomiting, abdominal pain, diarrhea, constipation, blood in stool and abdominal distention.  Genitourinary: Negative for dysuria, urgency, frequency, hematuria, flank pain, vaginal bleeding, vaginal discharge, difficulty urinating, vaginal pain and pelvic pain.  Musculoskeletal: Negative for arthralgias, gait problem and joint swelling.  Skin: Negative for rash.  Neurological: Negative for dizziness, syncope, speech difficulty, weakness, numbness and headaches.  Hematological: Negative for adenopathy.  Psychiatric/Behavioral: Negative for behavioral problems, dysphoric mood and agitation. The patient is nervous/anxious.        Objective:   Physical Exam  Constitutional: She is oriented to person, place, and time. She appears well-developed and well-nourished.  HENT:  Head: Normocephalic.  Right Ear: External ear normal.  Left Ear: External ear normal.  Mouth/Throat: Oropharynx is clear and moist.  Eyes: Conjunctivae and EOM are normal. Pupils are equal, round, and reactive to light.  Neck: Normal range of motion. Neck supple. No thyromegaly present.  Cardiovascular: Normal rate and regular rhythm.   Murmur heard. Grade 3/6 systolic murmur  Pulmonary/Chest: Effort normal and breath sounds normal.  Abdominal: Soft. Bowel sounds are normal. She exhibits no mass. There is no tenderness.  Musculoskeletal: Normal range of motion.  Lymphadenopathy:    She has no cervical adenopathy.  Neurological: She is alert and oriented to person,  place, and time.  Skin: Skin is warm and dry. No rash noted.  Psychiatric: She has a normal mood and affect. Her behavior is normal.          Assessment & Plan:   Hypertension well controlled Diabetes mellitus. We'll check a hemoglobin A1c Dyslipidemia. Continue statin therapy  Chronic kidney disease. We'll check a followup creatinine and GFR  Recheck 3 months Medicines refilled [place] weight loss encouraged

## 2013-10-08 NOTE — Progress Notes (Signed)
  Subjective:    Patient ID: Sarah [name], female    DOB: May 02, 1951, 62 y.o.   MRN: [id number]  HPI  Wt Readings from Last 3 Encounters:  10/08/13 163 lb (73.936 kg)  07/07/13 157 lb (71.215 kg)  04/07/13 154 lb (69.854 kg)    Review of Systems     Objective:   Physical Exam        Assessment & Plan:

## 2013-10-08 NOTE — Patient Instructions (Signed)
Limit your sodium (Salt) intake    It is important that you exercise regularly, at least 20 minutes 3 to 4 times per week.  If you develop chest pain or shortness of breath seek  medical attention.   Please check your hemoglobin A1c every 3 months   

## 2013-10-16 ENCOUNTER — Other Ambulatory Visit: Payer: Self-pay | Admitting: Internal Medicine

## 2013-10-18 ENCOUNTER — Other Ambulatory Visit (HOSPITAL_COMMUNITY): Payer: Self-pay | Admitting: Internal Medicine

## 2013-11-22 ENCOUNTER — Telehealth: Payer: Self-pay | Admitting: Internal Medicine

## 2013-11-22 MED ORDER — HYDROCODONE-ACETAMINOPHEN 10-325 MG PO TABS: 0.5000 | ORAL_TABLET | Freq: Three times a day (TID) | ORAL | Status: DC | PRN

## 2013-11-22 NOTE — Telephone Encounter (Signed)
Spoke to pt told her Rx ready for pickup will be at the front desk. Pt verbalized understanding. Rx printed and signed, put at front desk for pickup. 

## 2013-11-22 NOTE — Telephone Encounter (Signed)
Pt needs new rx hydrocodone °

## 2014-01-07 ENCOUNTER — Ambulatory Visit (INDEPENDENT_AMBULATORY_CARE_PROVIDER_SITE_OTHER): Payer: Medicare HMO | Admitting: Internal Medicine

## 2014-01-07 ENCOUNTER — Encounter: Payer: Self-pay | Admitting: Internal Medicine

## 2014-01-07 VITALS — BP 146/72 | HR 71 | Temp 97.6°F | Resp 20 | Ht 63.0 in | Wt 164.0 lb

## 2014-01-07 DIAGNOSIS — I10 Essential (primary) hypertension: Secondary | ICD-10-CM

## 2014-01-07 DIAGNOSIS — E119 Type 2 diabetes mellitus without complications: Secondary | ICD-10-CM

## 2014-01-07 DIAGNOSIS — E785 Hyperlipidemia, unspecified: Secondary | ICD-10-CM

## 2014-01-07 LAB — BASIC METABOLIC PANEL
BUN: 23 mg/dL (ref 6–23)
CO2: 25 mEq/L (ref 19–32)
CREATININE: 1.9 mg/dL — AB (ref 0.4–1.2)
Calcium: 9.2 mg/dL (ref 8.4–10.5)
Chloride: 107 mEq/L (ref 96–112)
GFR: 35.36 mL/min — AB (ref >60.00)
Glucose, Bld: 181 mg/dL — ABNORMAL HIGH (ref 70–99)
Potassium: 4.7 mEq/L (ref 3.5–5.1)
Sodium: 142 mEq/L (ref 135–145)

## 2014-01-07 LAB — HEMOGLOBIN A1C: HEMOGLOBIN A1C: 7 % — AB (ref 4.6–6.5)

## 2014-01-07 MED ORDER — LORAZEPAM 0.5 MG PO TABS: 0.5000 mg | ORAL_TABLET | Freq: Two times a day (BID) | ORAL | Status: DC | PRN

## 2014-01-07 MED ORDER — HYDROCODONE-ACETAMINOPHEN 10-325 MG PO TABS: 0.5000 | ORAL_TABLET | Freq: Three times a day (TID) | ORAL | Status: DC | PRN

## 2014-01-07 NOTE — Progress Notes (Signed)
Pre-visit discussion using our clinic review tool. No additional management support is needed unless otherwise documented below in the visit note.  

## 2014-01-07 NOTE — Patient Instructions (Signed)
Limit your sodium (Salt) intake   Please check your hemoglobin A1c every 3 months    It is important that you exercise regularly, at least 20 minutes 3 to 4 times per week.  If you develop chest pain or shortness of breath seek  medical attention.   

## 2014-01-07 NOTE — Progress Notes (Signed)
Subjective:    Patient ID: Sarah [name], female    DOB: 05/23/1951, 63 y.o.   MRN: [id number]  HPI  63 year old patient who is seen today for followup of type 2 diabetes. She has hypertension dyslipidemia. Last hemoglobin A1c 6.7. She has chronic kidney disease her last creatinine 2.2. No cardiopulmonary complaints. She feels quite well today.  She states that she uses Lantus 26 units at bedtime and Humalog 10 or 12 units prior to each meal  Past Medical History  Diagnosis Date  . Diabetes mellitus   . Hypertension   . Hyperlipidemia   . Anxiety   . Chronic kidney disease   . Peripheral neuropathy     History   Social History  . Marital Status: Single    Spouse Name: N/A    Number of Children: N/A  . Years of Education: N/A   Occupational History  . Not on file.   Social History Main Topics  . Smoking status: Never Smoker   . Smokeless tobacco: Never Used  . Alcohol Use: No  . Drug Use: No  . Sexual Activity: Not on file   Other Topics Concern  . Not on file   Social History Narrative  . No narrative on file    Past Surgical History  Procedure Laterality Date  . Tubal ligation    . Hemorrhoid surgery    . Neck surgery    . Cataract extraction, bilateral      Family History  Problem Relation Age of Onset  . Cancer Mother   . Hypertension Mother   . Diabetes Mother     Allergies  Allergen Reactions  . Cefaclor Hives  . Tetanus Toxoid Adsorbed Swelling    Current Outpatient Prescriptions on File Prior to Visit  Medication Sig Dispense Refill  . allopurinol (ZYLOPRIM) 100 MG tablet Take 1 tablet (100 mg total) by mouth daily.  30 tablet  6  . amLODipine (NORVASC) 10 MG tablet Take 1 tablet (10 mg total) by mouth daily.  90 tablet  3  . aspirin EC 81 MG tablet TAKE 1 TABLET (81 MG TOTAL) BY MOUTH DAILY. SWALLOW WHOLE.  30 tablet  2  . Blood Glucose Monitoring Suppl (PRODIGY AUTOCODE BLOOD GLUCOSE) DEVI As directed qid  1 each  0  . COLCRYS 0.6 MG  tablet Take 0.6 mg by mouth daily.       . cyclobenzaprine (FLEXERIL) 5 MG tablet Take 1 tablet (5 mg total) by mouth 3 (three) times daily as needed for muscle spasms.  30 tablet  5  . Difluprednate (DUREZOL) 0.05 % EMUL Place 1 drop into the left eye 2 (two) times daily.      Marland Kitchen. HUMALOG KWIKPEN 100 UNIT/ML SOPN INJECT 10-16 UNITS INTO THE SKIN 3 (THREE) TIMES DAILY BEFORE MEALS.  15 pen  2  . HYDROcodone-acetaminophen (NORCO) 10-325 MG per tablet Take 0.5-1 tablets by mouth every 8 (eight) hours as needed.  90 tablet  0  . insulin glargine (LANTUS) 100 UNIT/ML injection Inject 20 Units into the skin at bedtime. 20 units at bedtime      . Insulin Pen Needle (NOVOFINE) 30G X 8 MM MISC Inject 1 packet into the skin as needed.        Marland Kitchen. LORazepam (ATIVAN) 0.5 MG tablet Take 1 tablet (0.5 mg total) by mouth 2 (two) times daily as needed for anxiety.  90 tablet  1  . NEVANAC 0.1 % ophthalmic suspension Place 1 drop into the  right eye 3 (three) times daily.       . potassium bicarbonate (K-LYTE) 25 MEQ disintegrating tablet TAKE ONE TABLET BY MOUTH IN 6 OUNCES OF WATER DAILY  90 tablet  1  . pravastatin (PRAVACHOL) 40 MG tablet Take 1 tablet (40 mg total) by mouth daily.  90 tablet  3  . PRODIGY NO CODING BLOOD GLUC test strip TEST  SUGARS BEFORE MEALS  AND AT BEDTIME  400 each  3  . PRODIGY TWIST TOP LANCETS 28G MISC 1 each by Does not apply route 4 (four) times daily -  with meals and at bedtime.  400 each  3  . lisinopril (PRINIVIL,ZESTRIL) 20 MG tablet Take 1 tablet (20 mg total) by mouth daily.  30 tablet  11   No current facility-administered medications on file prior to visit.    BP 146/72  Pulse 71  Temp(Src) 97.6 F (36.4 C) (Oral)  Resp 20  Ht 5\' 3"  (1.6 m)  Wt 164 lb (74.39 kg)  BMI 29.06 kg/m2  SpO2 99%       Review of Systems  Constitutional: Negative.   HENT: Negative for congestion, dental problem, hearing loss, rhinorrhea, sinus pressure, sore throat and tinnitus.   Eyes:  Negative for pain, discharge and visual disturbance.  Respiratory: Negative for cough and shortness of breath.   Cardiovascular: Negative for chest pain, palpitations and leg swelling.  Gastrointestinal: Negative for nausea, vomiting, abdominal pain, diarrhea, constipation, blood in stool and abdominal distention.  Genitourinary: Negative for dysuria, urgency, frequency, hematuria, flank pain, vaginal bleeding, vaginal discharge, difficulty urinating, vaginal pain and pelvic pain.  Musculoskeletal: Negative for arthralgias, gait problem and joint swelling.  Skin: Negative for rash.  Neurological: Negative for dizziness, syncope, speech difficulty, weakness, numbness and headaches.  Hematological: Negative for adenopathy.  Psychiatric/Behavioral: Negative for behavioral problems, dysphoric mood and agitation. The patient is not nervous/anxious.        Objective:   Physical Exam  Constitutional: She is oriented to person, place, and time. She appears well-developed and well-nourished.  HENT:  Head: Normocephalic.  Right Ear: External ear normal.  Left Ear: External ear normal.  Mouth/Throat: Oropharynx is clear and moist.  Eyes: Conjunctivae and EOM are normal. Pupils are equal, round, and reactive to light.  Neck: Normal range of motion. Neck supple. No thyromegaly present.  Cardiovascular: Normal rate, regular rhythm, normal heart sounds and intact distal pulses.   Grade 3/6 systolic murmur loudest at the primary aortic area  Pulmonary/Chest: Effort normal and breath sounds normal.  Abdominal: Soft. Bowel sounds are normal. She exhibits no mass. There is no tenderness.  Musculoskeletal: Normal range of motion.  Lymphadenopathy:    She has no cervical adenopathy.  Neurological: She is alert and oriented to person, place, and time.  Skin: Skin is warm and dry. No rash noted.  Psychiatric: She has a normal mood and affect. Her behavior is normal.          Assessment & Plan:    Diabetes mellitus. We'll check a hemoglobin A1c. We'll continue basal bolus insulin therapy Chronic kidney disease. We'll check a creatinine Hypertension stable we'll continue aggressive blood pressure control

## 2014-01-11 ENCOUNTER — Telehealth: Payer: Self-pay

## 2014-01-11 NOTE — Telephone Encounter (Signed)
Relevant patient education mailed to patient.  

## 2014-01-19 ENCOUNTER — Other Ambulatory Visit: Payer: Self-pay | Admitting: Internal Medicine

## 2014-01-19 ENCOUNTER — Telehealth: Payer: Self-pay | Admitting: Internal Medicine

## 2014-01-19 DIAGNOSIS — E119 Type 2 diabetes mellitus without complications: Secondary | ICD-10-CM

## 2014-01-19 NOTE — Telephone Encounter (Signed)
Relevant patient education mailed to patient.  

## 2014-01-25 ENCOUNTER — Other Ambulatory Visit: Payer: Self-pay | Admitting: Internal Medicine

## 2014-02-07 ENCOUNTER — Telehealth: Payer: Self-pay | Admitting: Internal Medicine

## 2014-02-07 MED ORDER — HYDROCODONE-ACETAMINOPHEN 10-325 MG PO TABS: 0.5000 | ORAL_TABLET | Freq: Three times a day (TID) | ORAL | Status: DC | PRN

## 2014-02-07 NOTE — Telephone Encounter (Signed)
Pt requesting refill for HYDROcodone-acetaminophen (NORCO) 10-325 MG per tablet, would like to pick up tomorrow if possible.

## 2014-02-07 NOTE — Telephone Encounter (Signed)
Pt notified Rx ready for pickup, will be at the front desk. Rx printed and signed. 

## 2014-02-19 ENCOUNTER — Other Ambulatory Visit: Payer: Self-pay | Admitting: Internal Medicine

## 2014-03-10 ENCOUNTER — Telehealth: Payer: Self-pay | Admitting: Internal Medicine

## 2014-03-10 MED ORDER — HYDROCODONE-ACETAMINOPHEN 10-325 MG PO TABS: 0.5000 | ORAL_TABLET | Freq: Three times a day (TID) | ORAL | Status: DC | PRN

## 2014-03-10 NOTE — Telephone Encounter (Addendum)
Pt needs new rx hydrocodone. It pt not at home # please call wk [phone]

## 2014-03-11 NOTE — Telephone Encounter (Signed)
Left detailed message Rx ready for pickup, will be at the front desk. Rx printed and signed. 

## 2014-04-08 ENCOUNTER — Other Ambulatory Visit: Payer: Self-pay | Admitting: Internal Medicine

## 2014-04-08 ENCOUNTER — Encounter: Payer: Self-pay | Admitting: Internal Medicine

## 2014-04-08 ENCOUNTER — Ambulatory Visit (INDEPENDENT_AMBULATORY_CARE_PROVIDER_SITE_OTHER): Payer: Commercial Managed Care - HMO | Admitting: Internal Medicine

## 2014-04-08 VITALS — BP 130/80 | HR 68 | Temp 97.5°F | Resp 20 | Ht 63.0 in | Wt 172.0 lb

## 2014-04-08 DIAGNOSIS — M25462 Effusion, left knee: Secondary | ICD-10-CM

## 2014-04-08 DIAGNOSIS — N289 Disorder of kidney and ureter, unspecified: Secondary | ICD-10-CM

## 2014-04-08 DIAGNOSIS — I10 Essential (primary) hypertension: Secondary | ICD-10-CM

## 2014-04-08 DIAGNOSIS — E119 Type 2 diabetes mellitus without complications: Secondary | ICD-10-CM

## 2014-04-08 DIAGNOSIS — M25469 Effusion, unspecified knee: Secondary | ICD-10-CM

## 2014-04-08 DIAGNOSIS — E785 Hyperlipidemia, unspecified: Secondary | ICD-10-CM

## 2014-04-08 LAB — HEMOGLOBIN A1C: HEMOGLOBIN A1C: 6.8 % — AB (ref 4.6–6.5)

## 2014-04-08 LAB — LIPID PANEL
Cholesterol: 301 mg/dL — ABNORMAL HIGH (ref 0–200)
HDL: 48.5 mg/dL (ref >39.00)
LDL Cholesterol: 226 mg/dL — ABNORMAL HIGH (ref 0–99)
Total CHOL/HDL Ratio: 6
Triglycerides: 131 mg/dL (ref 0.0–149.0)
VLDL: 26.2 mg/dL (ref 0.0–40.0)

## 2014-04-08 LAB — URIC ACID: URIC ACID, SERUM: 9.7 mg/dL — AB (ref 2.4–7.0)

## 2014-04-08 MED ORDER — HYDROCODONE-ACETAMINOPHEN 10-325 MG PO TABS: 0.5000 | ORAL_TABLET | Freq: Three times a day (TID) | ORAL | Status: DC | PRN

## 2014-04-08 NOTE — Patient Instructions (Signed)
Increase colcrys to 3 times daily.  Orthopedic follow up if unimproved   Please check your hemoglobin A1c every 3 months  Limit your sodium (Salt) intake   Please check your hemoglobin A1c every 3 months
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
## 2014-04-11 ENCOUNTER — Other Ambulatory Visit: Payer: Self-pay | Admitting: *Deleted

## 2014-04-11 MED ORDER — ATORVASTATIN CALCIUM 80 MG PO TABS
80.0000 mg | ORAL_TABLET | Freq: Every day | ORAL | Status: DC
Start: 2014-04-11 — End: 2015-08-11

## 2014-05-05 ENCOUNTER — Telehealth: Payer: Self-pay | Admitting: Internal Medicine

## 2014-05-05 NOTE — Telephone Encounter (Signed)
Pt req rx on HYDROcodone-acetaminophen (NORCO) 10-325 MG per tablet ° °

## 2014-05-05 NOTE — Telephone Encounter (Signed)
Can pick up tomorrow

## 2014-05-06 MED ORDER — HYDROCODONE-ACETAMINOPHEN 10-325 MG PO TABS: 0.5000 | ORAL_TABLET | Freq: Three times a day (TID) | ORAL | Status: DC | PRN

## 2014-05-06 NOTE — Telephone Encounter (Signed)
Rx ready for pick up and patient is aware 

## 2014-06-02 ENCOUNTER — Telehealth: Payer: Self-pay | Admitting: Internal Medicine

## 2014-06-02 NOTE — Telephone Encounter (Signed)
Pt is needing new rx HYDROcodone-acetaminophen (NORCO) 10-325 MG per tablet, please call when available for pick up. ° °

## 2014-06-03 MED ORDER — HYDROCODONE-ACETAMINOPHEN 10-325 MG PO TABS: 0.5000 | ORAL_TABLET | Freq: Three times a day (TID) | ORAL | Status: DC | PRN

## 2014-06-06 NOTE — Telephone Encounter (Signed)
Left message on voicemail to call office.  

## 2014-06-06 NOTE — Telephone Encounter (Signed)
Pt returning your call. pu rx on Friday she said.

## 2014-06-06 NOTE — Telephone Encounter (Signed)
Noted  

## 2014-06-28 ENCOUNTER — Other Ambulatory Visit: Payer: Self-pay | Admitting: Internal Medicine

## 2014-06-29 ENCOUNTER — Telehealth: Payer: Self-pay | Admitting: Internal Medicine

## 2014-06-29 MED ORDER — PRODIGY LANCING DEVICE MISC: 1.0000 | Freq: Once | Status: DC

## 2014-06-29 MED ORDER — PRODIGY TWIST TOP LANCETS 28G MISC: 1.0000 | Freq: Three times a day (TID) | Status: DC

## 2014-06-29 MED ORDER — BD SWAB SINGLE USE REGULAR PADS: MEDICATED_PAD | Status: DC

## 2014-06-29 NOTE — Telephone Encounter (Signed)
Rxs sent

## 2014-06-29 NOTE — Telephone Encounter (Signed)
[facility] - [place], [place] - [address] is requesting re-fills on the following: PRODIGY TWIST TOP 28G LANCET PRODIGY LANCING DEVICE BD SINGLE USE SWAB

## 2014-07-08 ENCOUNTER — Encounter: Payer: Self-pay | Admitting: Internal Medicine

## 2014-07-08 ENCOUNTER — Ambulatory Visit (INDEPENDENT_AMBULATORY_CARE_PROVIDER_SITE_OTHER): Payer: Commercial Managed Care - HMO | Admitting: Internal Medicine

## 2014-07-08 VITALS — BP 132/76 | HR 68 | Temp 97.9°F | Ht 63.0 in | Wt 173.0 lb

## 2014-07-08 DIAGNOSIS — E119 Type 2 diabetes mellitus without complications: Secondary | ICD-10-CM

## 2014-07-08 DIAGNOSIS — E785 Hyperlipidemia, unspecified: Secondary | ICD-10-CM

## 2014-07-08 DIAGNOSIS — N289 Disorder of kidney and ureter, unspecified: Secondary | ICD-10-CM

## 2014-07-08 DIAGNOSIS — I10 Essential (primary) hypertension: Secondary | ICD-10-CM

## 2014-07-08 LAB — HEMOGLOBIN A1C: Hgb A1c MFr Bld: 6.9 % — ABNORMAL HIGH (ref 4.6–6.5)

## 2014-07-08 LAB — LIPID PANEL
CHOL/HDL RATIO: 5
Cholesterol: 283 mg/dL — ABNORMAL HIGH (ref 0–200)
HDL: 52.6 mg/dL (ref >39.00)
LDL Cholesterol: 207 mg/dL — ABNORMAL HIGH (ref 0–99)
NONHDL: 230.4
Triglycerides: 118 mg/dL (ref 0.0–149.0)
VLDL: 23.6 mg/dL (ref 0.0–40.0)

## 2014-07-08 MED ORDER — HYDROCODONE-ACETAMINOPHEN 10-325 MG PO TABS: 0.5000 | ORAL_TABLET | Freq: Three times a day (TID) | ORAL | Status: DC | PRN

## 2014-07-08 NOTE — Patient Instructions (Signed)
Please check your hemoglobin A1c every 3 months  Limit your sodium (Salt) intake    It is important that you exercise regularly, at least 20 minutes 3 to 4 times per week.  If you develop chest pain or shortness of breath seek  medical attention.  Please check your blood pressure on a regular basis.  If it is consistently greater than 150/90, please make an office appointment.   

## 2014-07-08 NOTE — Progress Notes (Signed)
Subjective:    Patient ID: Sarah [name], female    DOB: [birth year], 63 y.o.   MRN: [id number]  HPI 63 year old patient who was seen for her quarterly  followup.  She has type 2 diabetes, which has been managed with multiple daily injections of insulin. Chief complaint today is left knee pain.  She was told that she has end-stage arthritis and will require total knee replacement surgery She has hypertension, dyslipidemia, and chronic kidney disease.  She has a history of cerebrovascular disease She also complains some discomfort involving the left chest wall. She is scheduled for an eye exam in September  Past Medical History  Diagnosis Date  . Diabetes mellitus   . Hypertension   . Hyperlipidemia   . Anxiety   . Chronic kidney disease   . Peripheral neuropathy     History   Social History  . Marital Status: Single    Spouse Name: N/A    Number of Children: N/A  . Years of Education: N/A   Occupational History  . Not on file.   Social History Main Topics  . Smoking status: Never Smoker   . Smokeless tobacco: Never Used  . Alcohol Use: No  . Drug Use: No  . Sexual Activity: Not on file   Other Topics Concern  . Not on file   Social History Narrative  . No narrative on file    Past Surgical History  Procedure Laterality Date  . Tubal ligation    . Hemorrhoid surgery    . Neck surgery    . Cataract extraction, bilateral      Family History  Problem Relation Age of Onset  . Cancer Mother   . Hypertension Mother   . Diabetes Mother     Allergies  Allergen Reactions  . Cefaclor Hives  . Tetanus Toxoid Adsorbed Swelling    Current Outpatient Prescriptions on File Prior to Visit  Medication Sig Dispense Refill  . Alcohol Swabs (B-D SINGLE USE SWABS REGULAR) PADS Use as directed  100 each  4  . allopurinol (ZYLOPRIM) 100 MG tablet Take 1 tablet (100 mg total) by mouth daily.  30 tablet  6  . amLODipine (NORVASC) 10 MG tablet TAKE 1 TABLET EVERY DAY  90  tablet  1  . aspirin EC 81 MG tablet TAKE 1 TABLET (81 MG TOTAL) BY MOUTH DAILY. SWALLOW WHOLE.  30 tablet  2  . atorvastatin (LIPITOR) 80 MG tablet Take 1 tablet (80 mg total) by mouth daily.  90 tablet  1  . Blood Glucose Monitoring Suppl (PRODIGY AUTOCODE BLOOD GLUCOSE) DEVI As directed qid  1 each  0  . colchicine (COLCRYS) 0.6 MG tablet Take 1 tablet (0.6 mg total) by mouth daily.  30 tablet  2  . cyclobenzaprine (FLEXERIL) 5 MG tablet Take 1 tablet (5 mg total) by mouth 3 (three) times daily as needed for muscle spasms.  30 tablet  5  . HUMALOG KWIKPEN 100 UNIT/ML KiwkPen INJECT 10-16 UNITS INTO THE SKIN 3 (THREE) TIMES DAILY BEFORE MEALS.  15 pen  5  . insulin glargine (LANTUS) 100 UNIT/ML injection Inject 20 Units into the skin at bedtime. 20 units at bedtime      . Insulin Pen Needle (NOVOFINE) 30G X 8 MM MISC Inject 1 packet into the skin as needed.        Dem[employer] (PRODIGY LANCING DEVICE) MISC 1 each by Does not apply route once.  1 each  1  .  LORazepam (ATIVAN) 0.5 MG tablet Take 1 tablet (0.5 mg total) by mouth 2 (two) times daily as needed for anxiety.  180 tablet  1  . potassium bicarbonate (K-LYTE) 25 MEQ disintegrating tablet TAKE ONE TABLET BY MOUTH IN 6 OUNCES OF WATER DAILY  90 tablet  1  . PRODIGY NO CODING BLOOD GLUC test strip TEST  SUGARS BEFORE MEALS  AND AT BEDTIME  400 each  3   No current facility-administered medications on file prior to visit.    BP 132/76  Pulse 68  Temp(Src) 97.9 F (36.6 C) (Oral)  Ht 5\' 3"  (1.6 m)  Wt 173 lb (78.472 kg)  BMI 30.65 kg/m2      Review of Systems  Constitutional: Negative.   HENT: Negative for congestion, dental problem, hearing loss, rhinorrhea, sinus pressure, sore throat and tinnitus.   Eyes: Negative for pain, discharge and visual disturbance.  Respiratory: Negative for cough and shortness of breath.   Cardiovascular: Positive for chest pain. Negative for palpitations and leg swelling.  Gastrointestinal:  Negative for nausea, vomiting, abdominal pain, diarrhea, constipation, blood in stool and abdominal distention.  Genitourinary: Negative for dysuria, urgency, frequency, hematuria, flank pain, vaginal bleeding, vaginal discharge, difficulty urinating, vaginal pain and pelvic pain.  Musculoskeletal: Positive for arthralgias. Negative for gait problem and joint swelling.  Skin: Negative for rash.  Neurological: Negative for dizziness, syncope, speech difficulty, weakness, numbness and headaches.  Hematological: Negative for adenopathy.  Psychiatric/Behavioral: Negative for behavioral problems, dysphoric mood and agitation. The patient is nervous/anxious.        Objective:   Physical Exam  Constitutional: She is oriented to person, place, and time. She appears well-developed and well-nourished.  Repeat blood pressure 132/76  HENT:  Head: Normocephalic.  Right Ear: External ear normal.  Left Ear: External ear normal.  Mouth/Throat: Oropharynx is clear and moist.  Eyes: Conjunctivae and EOM are normal. Pupils are equal, round, and reactive to light.  Neck: Normal range of motion. Neck supple. No thyromegaly present.  Cardiovascular: Normal rate, regular rhythm and intact distal pulses.   Murmur heard. Pulmonary/Chest: Effort normal and breath sounds normal.  Abdominal: Soft. Bowel sounds are normal. She exhibits no mass. There is no tenderness.  Musculoskeletal: Normal range of motion.  Lymphadenopathy:    She has no cervical adenopathy.  Neurological: She is alert and oriented to person, place, and time.  Skin: Skin is warm and dry. No rash noted.  Psychiatric: She has a normal mood and affect. Her behavior is normal.          Assessment & Plan:   Hypertension Diabetes mellitus.  Check a hemoglobin A1c Osteoarthritis with left knee pain.  Followup orthopedics Chronic kidney disease.  We'll check renal indices Dyslipidemia.  We'll check a lipid panel.  Placed on atorvastatin 3  months ago due to inadequate control on pravastatin

## 2014-07-08 NOTE — Progress Notes (Signed)
Pre visit review using our clinic review tool, if applicable. No additional management support is needed unless otherwise documented below in the visit note. 

## 2014-07-09 ENCOUNTER — Other Ambulatory Visit: Payer: Self-pay | Admitting: Internal Medicine

## 2014-07-11 ENCOUNTER — Other Ambulatory Visit: Payer: Self-pay | Admitting: *Deleted

## 2014-07-11 ENCOUNTER — Telehealth: Payer: Self-pay | Admitting: Internal Medicine

## 2014-07-11 MED ORDER — GLUCOSE BLOOD VI STRP: ORAL_STRIP | Status: DC

## 2014-07-11 MED ORDER — EZETIMIBE 10 MG PO TABS: 10.0000 mg | ORAL_TABLET | Freq: Every day | ORAL | Status: DC

## 2014-07-11 NOTE — Telephone Encounter (Signed)
Pt states the rx ezetimibe (ZETIA) 10 MG tablet was $135 and she cannot afford. Pt bought one/half mo. Cannot afford. Pt would like a cb. Pt states they told her it was cholesterol. Pls call.

## 2014-07-11 NOTE — Telephone Encounter (Signed)
Pt notified [facility]

## 2014-07-11 NOTE — Telephone Encounter (Signed)
req rx on PRODIGY NO CODING BLOOD GLUC test strip

## 2014-07-12 ENCOUNTER — Telehealth: Payer: Self-pay | Admitting: Internal Medicine

## 2014-07-12 NOTE — Telephone Encounter (Signed)
Pt called back, told her will discuss with Dr. Kirtland BouchardK when he returns on Monday and get back to her about Atorvastatin and Zetia. Pt verbalized understanding.

## 2014-07-12 NOTE — Telephone Encounter (Signed)
Pt called back, c/o diarrhea since yesterday when increased Atorvastatin to 80 mg. Pt said she stopped it and started taking the Zetia today. Told pt okay just take the Zetia and I will let Dr. Kirtland BouchardK know when he gets back on Monday. Told pt to drink plenty of fluids, and follow BRAT diet, and try Imodium OTC for diarrhea. Pt verbalized understanding.

## 2014-07-12 NOTE — Telephone Encounter (Signed)
Left message on voicemail to call office.  

## 2014-07-12 NOTE — Telephone Encounter (Signed)
Pt is requesting to speak with you regarding her rxatorvastatin (LIPITOR) 80 MG tablet, pt states the meds is messing her up bad, she has diarrhea really bad. Pt wants to know what she should do and what her options are.

## 2014-07-18 NOTE — Telephone Encounter (Signed)
Spoke to pt, told her Dr. Kirtland BouchardK wants her to go back on Atorvastatin 40 mg for one week and increase to 80 mg and see how she does. Told pt think she had stomach bug. Pt verbalized understanding. Asked pt if she has a computer? Pt stated no. Told pt I will look into assistance for Zetia medication and get back to her. Pt said do I need to be on two medications? Told pt yes, due to cholesterol and LDL so high. Pt said she was not taking Atorvastatin regularly was off for months. Told her okay I will let Dr. Kirtland BouchardK know, but needs to start taking medication regularly. Pt verbalized understanding.

## 2014-08-04 ENCOUNTER — Telehealth: Payer: Self-pay | Admitting: Internal Medicine

## 2014-08-04 NOTE — Telephone Encounter (Signed)
Pt request refill of the following: HYDROcodone-acetaminophen (NORCO) 10-325 MG per tablet ° ° ° °Phamacy:   Pick up  ° °

## 2014-08-08 MED ORDER — HYDROCODONE-ACETAMINOPHEN 10-325 MG PO TABS: 0.5000 | ORAL_TABLET | Freq: Three times a day (TID) | ORAL | Status: DC | PRN

## 2014-08-08 NOTE — Telephone Encounter (Signed)
Pt notified Rx ready for pickup. Rx printed and signed.  

## 2014-08-09 NOTE — Telephone Encounter (Signed)
Left message on voicemail to call office.  

## 2014-08-10 NOTE — Telephone Encounter (Signed)
Spoke to pt, told her I have a patient assistance form for her to fill out to help with Zetia Rx. Told her to stop by office to pickup papers and then bring back and I will fax over. Pt verbalized understanding.

## 2014-08-15 NOTE — Telephone Encounter (Signed)
Pt brought back her part of form completed. Form for patient assistance for Zetia completed and mailed to [employer].

## 2014-08-26 ENCOUNTER — Telehealth: Payer: Self-pay | Admitting: Internal Medicine

## 2014-08-26 NOTE — Telephone Encounter (Signed)
done Authorization # [id number] 08/29/2014 - 02/27/2015 [facility]: Edmon Crape MD  Address: [address], [place], [place] [address]  [phone]

## 2014-08-26 NOTE — Telephone Encounter (Signed)
Pt has appt  9am on 9/14 at dr Fawn Kirk.  [phone]  [address]  [place], [place] [address] Fax [phone] Pt has seen the eye dr regularly but needs referral due to [facility] ins

## 2014-08-29 LAB — HM DIABETES EYE EXAM

## 2014-08-30 ENCOUNTER — Telehealth: Payer: Self-pay | Admitting: Internal Medicine

## 2014-08-30 NOTE — Telephone Encounter (Signed)
Spoke to pt, told her I have not heard anything yet. If I don't by end of the week will call. Pt verbalized understanding.

## 2014-08-30 NOTE — Telephone Encounter (Signed)
Pt wants to fu on the paperwork qualifying her med approval at the lower price

## 2014-09-09 ENCOUNTER — Encounter: Payer: Self-pay | Admitting: Internal Medicine

## 2014-09-09 NOTE — Telephone Encounter (Signed)
Left message on voicemail to call office.  

## 2014-09-09 NOTE — Telephone Encounter (Signed)
Called [employer] and spoke to [place], regarding patient assistance forms mailed on 8/31. Renea Ee said they received them on 9/14 and mailed a exception form for patient to fill out and return. Told her okay I will follow up with pt.

## 2014-09-15 ENCOUNTER — Telehealth: Payer: Self-pay | Admitting: Internal Medicine

## 2014-09-15 MED ORDER — HYDROCODONE-ACETAMINOPHEN 10-325 MG PO TABS: 0.5000 | ORAL_TABLET | Freq: Three times a day (TID) | ORAL | Status: DC | PRN

## 2014-09-15 NOTE — Telephone Encounter (Signed)
ok 

## 2014-09-15 NOTE — Telephone Encounter (Signed)
Pt needs new rx hydrocodone. Pt would like pick up rx tomorrow

## 2014-09-15 NOTE — Telephone Encounter (Signed)
rx up front for p/u, pt aware 

## 2014-09-16 NOTE — Telephone Encounter (Signed)
Left message on voicemail to call office.  

## 2014-09-30 MED ORDER — ALLOPURINOL 100 MG PO TABS: 100.0000 mg | ORAL_TABLET | Freq: Every day | ORAL | Status: DC

## 2014-09-30 MED ORDER — ASPIRIN EC 81 MG PO TBEC: DELAYED_RELEASE_TABLET | ORAL | Status: DC

## 2014-09-30 MED ORDER — CYCLOBENZAPRINE HCL 5 MG PO TABS: 5.0000 mg | ORAL_TABLET | Freq: Three times a day (TID) | ORAL | Status: DC | PRN

## 2014-09-30 NOTE — Telephone Encounter (Signed)
Spoke to pt, asked her if she received form from Patient Assistance to fill out for Zetia? Pt said yes, but she did not bother with it because they were only going to give her one month supply and then she would have to reappy and pt said she did not want to bother. Told pt okay and asked if taking Zetia? Pt said no, I have an appointment next week. Told pt okay.

## 2014-10-05 ENCOUNTER — Ambulatory Visit (INDEPENDENT_AMBULATORY_CARE_PROVIDER_SITE_OTHER): Payer: Commercial Managed Care - HMO | Admitting: Internal Medicine

## 2014-10-05 ENCOUNTER — Encounter: Payer: Self-pay | Admitting: Internal Medicine

## 2014-10-05 VITALS — BP 120/80 | HR 65 | Temp 97.5°F | Resp 20 | Ht 63.0 in | Wt 169.0 lb

## 2014-10-05 DIAGNOSIS — E0821 Diabetes mellitus due to underlying condition with diabetic nephropathy: Secondary | ICD-10-CM

## 2014-10-05 DIAGNOSIS — E7800 Pure hypercholesterolemia, unspecified: Secondary | ICD-10-CM

## 2014-10-05 DIAGNOSIS — N289 Disorder of kidney and ureter, unspecified: Secondary | ICD-10-CM

## 2014-10-05 DIAGNOSIS — Z23 Encounter for immunization: Secondary | ICD-10-CM

## 2014-10-05 NOTE — Patient Instructions (Signed)
Limit your sodium (Salt) intake   Please check your hemoglobin A1c every 3 months   

## 2014-10-05 NOTE — Progress Notes (Signed)
Subjective:    Patient ID: Sarah [name], female    DOB: 08/08/1951, 63 y.o.   MRN: [id number]  HPI   63 year old patient, who is seen today for followup.  She has type 2 diabetes, and renal insufficiency.  She has significant hypercholesterolemia.  There has been some compliance issues with her medication.  Apparently, she is on atorvastatin 40 resident 80 and at the present time.  Not taken, Zetia. She feels well today.  She felt she had some diarrhea issues with full dose of atorvastatin.  She has treated hypertension, which has been stable. She was seen by ophthalmology in June and was given a good report No cardiopulmonary complaints  Past Medical History  Diagnosis Date  . Diabetes mellitus   . Hypertension   . Hyperlipidemia   . Anxiety   . Chronic kidney disease   . Peripheral neuropathy     History   Social History  . Marital Status: Single    Spouse Name: N/A    Number of Children: N/A  . Years of Education: N/A   Occupational History  . Not on file.   Social History Main Topics  . Smoking status: Never Smoker   . Smokeless tobacco: Never Used  . Alcohol Use: No  . Drug Use: No  . Sexual Activity: Not on file   Other Topics Concern  . Not on file   Social History Narrative  . No narrative on file    Past Surgical History  Procedure Laterality Date  . Tubal ligation    . Hemorrhoid surgery    . Neck surgery    . Cataract extraction, bilateral      Family History  Problem Relation Age of Onset  . Cancer Mother   . Hypertension Mother   . Diabetes Mother     Allergies  Allergen Reactions  . Cefaclor Hives  . Tetanus Toxoid Adsorbed Swelling    Current Outpatient Prescriptions on File Prior to Visit  Medication Sig Dispense Refill  . Alcohol Swabs (B-D SINGLE USE SWABS REGULAR) PADS Use as directed  100 each  4  . allopurinol (ZYLOPRIM) 100 MG tablet Take 1 tablet (100 mg total) by mouth daily.  90 tablet  1  . amLODipine (NORVASC) 10 MG  tablet TAKE 1 TABLET EVERY DAY  90 tablet  1  . aspirin EC 81 MG tablet TAKE 1 TABLET (81 MG TOTAL) BY MOUTH DAILY. SWALLOW WHOLE.  90 tablet  1  . atorvastatin (LIPITOR) 80 MG tablet Take 1 tablet (80 mg total) by mouth daily.  90 tablet  1  . Blood Glucose Monitoring Suppl (PRODIGY AUTOCODE BLOOD GLUCOSE) DEVI As directed qid  1 each  0  . colchicine (COLCRYS) 0.6 MG tablet Take 1 tablet (0.6 mg total) by mouth daily.  30 tablet  2  . cyclobenzaprine (FLEXERIL) 5 MG tablet Take 1 tablet (5 mg total) by mouth 3 (three) times daily as needed for muscle spasms.  90 tablet  1  . glucose blood (PRODIGY NO CODING BLOOD GLUC) test strip TEST  SUGARS BEFORE MEALS  AND AT BEDTIME  400 each  3  . HUMALOG KWIKPEN 100 UNIT/ML KiwkPen INJECT 10-16 UNITS INTO THE SKIN 3 (THREE) TIMES DAILY BEFORE MEALS.  15 pen  5  . HYDROcodone-acetaminophen (NORCO) 10-325 MG per tablet Take 0.5-1 tablets by mouth every 8 (eight) hours as needed.  90 tablet  0  . insulin glargine (LANTUS) 100 UNIT/ML injection Inject 20 Units into  the skin at bedtime. 20 units at bedtime      . Insulin Pen Needle (NOVOFINE) 30G X 8 MM MISC Inject 1 packet into the skin as needed.        Demetra Shiner. Lancet Devices (PRODIGY LANCING DEVICE) MISC 1 each by Does not apply route once.  1 each  1  . LORazepam (ATIVAN) 0.5 MG tablet Take 1 tablet (0.5 mg total) by mouth 2 (two) times daily as needed for anxiety.  180 tablet  1  . potassium bicarbonate (K-LYTE) 25 MEQ disintegrating tablet TAKE ONE TABLET BY MOUTH IN 6 OUNCES OF WATER DAILY  90 tablet  1  . ezetimibe (ZETIA) 10 MG tablet Take 1 tablet (10 mg total) by mouth daily.  90 tablet  1   No current facility-administered medications on file prior to visit.    BP 120/80  Pulse 65  Temp(Src) 97.5 F (36.4 C) (Oral)  Resp 20  Ht 5\' 3"  (1.6 m)  Wt 169 lb (76.658 kg)  BMI 29.94 kg/m2  SpO2 96%     Review of Systems  Constitutional: Negative.   HENT: Negative for congestion, dental problem,  hearing loss, rhinorrhea, sinus pressure, sore throat and tinnitus.   Eyes: Negative for pain, discharge and visual disturbance.  Respiratory: Negative for cough and shortness of breath.   Cardiovascular: Negative for chest pain, palpitations and leg swelling.  Gastrointestinal: Negative for nausea, vomiting, abdominal pain, diarrhea, constipation, blood in stool and abdominal distention.  Genitourinary: Negative for dysuria, urgency, frequency, hematuria, flank pain, vaginal bleeding, vaginal discharge, difficulty urinating, vaginal pain and pelvic pain.  Musculoskeletal: Negative for arthralgias, gait problem and joint swelling.  Skin: Negative for rash.  Neurological: Negative for dizziness, syncope, speech difficulty, weakness, numbness and headaches.  Hematological: Negative for adenopathy.  Psychiatric/Behavioral: Negative for behavioral problems, dysphoric mood and agitation. The patient is not nervous/anxious.        Objective:   Physical Exam  Constitutional: She is oriented to person, place, and time. She appears well-developed and well-nourished.  HENT:  Head: Normocephalic.  Right Ear: External ear normal.  Left Ear: External ear normal.  Mouth/Throat: Oropharynx is clear and moist.  Eyes: Conjunctivae and EOM are normal. Pupils are equal, round, and reactive to light.  Neck: Normal range of motion. Neck supple. No thyromegaly present.  Cardiovascular: Normal rate, regular rhythm, normal heart sounds and intact distal pulses.   Pulmonary/Chest: Effort normal and breath sounds normal.  Abdominal: Soft. Bowel sounds are normal. She exhibits no mass. There is no tenderness.  Musculoskeletal: Normal range of motion.  Lymphadenopathy:    She has no cervical adenopathy.  Neurological: She is alert and oriented to person, place, and time.  Skin: Skin is warm and dry. No rash noted.  Psychiatric: She has a normal mood and affect. Her behavior is normal.          Assessment  & Plan:   Diabetes mellitus.  Has been well controlled with hemoglobin A1c is consistent less than 7.  Hypertension well controlled.  Chronic kidney disease Dyslipidemia.  Compliance with medications.  Encouraged  Flu vaccine administered Recheck 3 months with lab

## 2014-10-05 NOTE — Progress Notes (Signed)
Pre visit review using our clinic review tool, if applicable. No additional management support is needed unless otherwise documented below in the visit note. 

## 2014-10-26 ENCOUNTER — Telehealth: Payer: Self-pay | Admitting: Internal Medicine

## 2014-10-26 NOTE — Telephone Encounter (Signed)
Pt needs samples humalog kwikpen. Pt in [place]

## 2014-10-27 ENCOUNTER — Telehealth: Payer: Self-pay | Admitting: Internal Medicine

## 2014-10-27 MED ORDER — HYDROCODONE-ACETAMINOPHEN 10-325 MG PO TABS: 0.5000 | ORAL_TABLET | Freq: Three times a day (TID) | ORAL | Status: DC | PRN

## 2014-10-27 NOTE — Telephone Encounter (Signed)
Pt notified Rx ready for pickup. Rx printed and signed.  

## 2014-10-27 NOTE — Telephone Encounter (Signed)
Left detailed message that I have a sample Humalog Kwikpen for her to stop by office to pickup.

## 2014-10-27 NOTE — Telephone Encounter (Signed)
Pt request refill HYDROcodone-acetaminophen (NORCO) 10-325 MG per tablet  Pt would like to pu when she picks up humalog pen tomorrow. thanks

## 2014-10-27 NOTE — Telephone Encounter (Signed)
Pt called back will pick up sample tomorrow when she comes for Rx.

## 2014-11-16 ENCOUNTER — Telehealth: Payer: Self-pay | Admitting: Internal Medicine

## 2014-11-16 MED ORDER — INSULIN LISPRO 100 UNIT/ML (KWIKPEN): PEN_INJECTOR | SUBCUTANEOUS | Status: DC

## 2014-11-16 NOTE — Telephone Encounter (Signed)
Patient will come pick-up samples tomorrow. Thanks Bulgariaalicia

## 2014-11-16 NOTE — Telephone Encounter (Signed)
Patient states you gave her an insulin pen and would like to know if you can give her another one?  HUMALOG KWIKPEN 100 UNIT/ML KiwkPen

## 2014-11-16 NOTE — Telephone Encounter (Signed)
You are welcome. Samples ready for pick up.

## 2014-11-29 ENCOUNTER — Telehealth: Payer: Self-pay | Admitting: Internal Medicine

## 2014-11-29 MED ORDER — HYDROCODONE-ACETAMINOPHEN 10-325 MG PO TABS: 0.5000 | ORAL_TABLET | Freq: Three times a day (TID) | ORAL | Status: DC | PRN

## 2014-11-29 NOTE — Telephone Encounter (Signed)
Pt request refill of the following: HYDROcodone-acetaminophen (NORCO) 10-325 MG per tablet ° ° °Phamacy: pick up  ° °

## 2014-11-29 NOTE — Telephone Encounter (Signed)
Left detailed message Rx ready for pickup. Rx printed and signed by Dr. Todd. 

## 2014-12-08 ENCOUNTER — Telehealth: Payer: Self-pay | Admitting: Internal Medicine

## 2014-12-08 ENCOUNTER — Emergency Department (HOSPITAL_COMMUNITY)
Admission: EM | Admit: 2014-12-08 | Discharge: 2014-12-08 | Disposition: A | Discharge status: Home / Self Care | Payer: Commercial Managed Care - HMO | Attending: Emergency Medicine | Admitting: Emergency Medicine

## 2014-12-08 ENCOUNTER — Encounter (HOSPITAL_COMMUNITY): Payer: Self-pay | Admitting: Emergency Medicine

## 2014-12-08 ENCOUNTER — Emergency Department (HOSPITAL_COMMUNITY): Payer: Commercial Managed Care - HMO

## 2014-12-08 DIAGNOSIS — E785 Hyperlipidemia, unspecified: Secondary | ICD-10-CM | POA: Diagnosis not present

## 2014-12-08 DIAGNOSIS — E119 Type 2 diabetes mellitus without complications: Secondary | ICD-10-CM | POA: Insufficient documentation

## 2014-12-08 DIAGNOSIS — R112 Nausea with vomiting, unspecified: Secondary | ICD-10-CM

## 2014-12-08 DIAGNOSIS — Z8669 Personal history of other diseases of the nervous system and sense organs: Secondary | ICD-10-CM | POA: Insufficient documentation

## 2014-12-08 DIAGNOSIS — Z79899 Other long term (current) drug therapy: Secondary | ICD-10-CM | POA: Insufficient documentation

## 2014-12-08 DIAGNOSIS — Z7982 Long term (current) use of aspirin: Secondary | ICD-10-CM | POA: Insufficient documentation

## 2014-12-08 DIAGNOSIS — J189 Pneumonia, unspecified organism: Secondary | ICD-10-CM

## 2014-12-08 DIAGNOSIS — N189 Chronic kidney disease, unspecified: Secondary | ICD-10-CM | POA: Insufficient documentation

## 2014-12-08 DIAGNOSIS — Z794 Long term (current) use of insulin: Secondary | ICD-10-CM | POA: Diagnosis not present

## 2014-12-08 DIAGNOSIS — J159 Unspecified bacterial pneumonia: Secondary | ICD-10-CM | POA: Insufficient documentation

## 2014-12-08 DIAGNOSIS — F419 Anxiety disorder, unspecified: Secondary | ICD-10-CM | POA: Insufficient documentation

## 2014-12-08 DIAGNOSIS — I129 Hypertensive chronic kidney disease with stage 1 through stage 4 chronic kidney disease, or unspecified chronic kidney disease: Secondary | ICD-10-CM | POA: Diagnosis not present

## 2014-12-08 DIAGNOSIS — R05 Cough: Secondary | ICD-10-CM

## 2014-12-08 DIAGNOSIS — R1110 Vomiting, unspecified: Secondary | ICD-10-CM | POA: Diagnosis present

## 2014-12-08 DIAGNOSIS — R059 Cough, unspecified: Secondary | ICD-10-CM

## 2014-12-08 LAB — CBC WITH DIFFERENTIAL/PLATELET
Basophils Absolute: 0 10*3/uL (ref 0.0–0.1)
Basophils Relative: 0 % (ref 0–1)
Eosinophils Absolute: 0 10*3/uL (ref 0.0–0.7)
Eosinophils Relative: 0 % (ref 0–5)
HCT: 38.9 % (ref 36.0–46.0)
Hemoglobin: 12.5 g/dL (ref 12.0–15.0)
Lymphocytes Relative: 13 % (ref 12–46)
Lymphs Abs: 1 10*3/uL (ref 0.7–4.0)
MCH: 26.5 pg (ref 26.0–34.0)
MCHC: 32.1 g/dL (ref 30.0–36.0)
MCV: 82.4 fL (ref 78.0–100.0)
Monocytes Absolute: 0.9 10*3/uL (ref 0.1–1.0)
Monocytes Relative: 12 % (ref 3–12)
Neutro Abs: 5.6 10*3/uL (ref 1.7–7.7)
Neutrophils Relative %: 75 % (ref 43–77)
Platelets: 307 10*3/uL (ref 150–400)
RBC: 4.72 MIL/uL (ref 3.87–5.11)
RDW: 14.2 % (ref 11.5–15.5)
WBC: 7.5 10*3/uL (ref 4.0–10.5)

## 2014-12-08 LAB — COMPREHENSIVE METABOLIC PANEL
ALT: 14 U/L (ref 0–35)
AST: 24 U/L (ref 0–37)
Albumin: 4.3 g/dL (ref 3.5–5.2)
Alkaline Phosphatase: 81 U/L (ref 39–117)
Anion gap: 11 (ref 5–15)
BUN: 28 mg/dL — ABNORMAL HIGH (ref 6–23)
CO2: 26 mmol/L (ref 19–32)
Calcium: 8.9 mg/dL (ref 8.4–10.5)
Chloride: 104 mEq/L (ref 96–112)
Creatinine, Ser: 2.25 mg/dL — ABNORMAL HIGH (ref 0.50–1.10)
GFR calc Af Amer: 25 mL/min — ABNORMAL LOW (ref >90)
GFR calc non Af Amer: 22 mL/min — ABNORMAL LOW (ref >90)
Glucose, Bld: 224 mg/dL — ABNORMAL HIGH (ref 70–99)
Potassium: 3.4 mmol/L — ABNORMAL LOW (ref 3.5–5.1)
Sodium: 141 mmol/L (ref 135–145)
Total Bilirubin: 0.6 mg/dL (ref 0.3–1.2)
Total Protein: 8.4 g/dL — ABNORMAL HIGH (ref 6.0–8.3)

## 2014-12-08 LAB — LIPASE, BLOOD: Lipase: 20 U/L (ref 11–59)

## 2014-12-08 LAB — CBG MONITORING, ED: GLUCOSE-CAPILLARY: 199 mg/dL — AB (ref 70–99)

## 2014-12-08 MED ORDER — ONDANSETRON 8 MG PO TBDP
8.0000 mg | ORAL_TABLET | Freq: Once | ORAL | Status: AC
  Administered 2014-12-08: 8 mg via ORAL
  Filled 2014-12-08: qty 1

## 2014-12-08 MED ORDER — AMOXICILLIN-POT CLAVULANATE 875-125 MG PO TABS
1.0000 | ORAL_TABLET | Freq: Once | ORAL | Status: DC
  Filled 2014-12-08: qty 1

## 2014-12-08 MED ORDER — AZITHROMYCIN 250 MG PO TABS
500.0000 mg | ORAL_TABLET | Freq: Once | ORAL | Status: AC
  Administered 2014-12-08: 500 mg via ORAL
  Filled 2014-12-08: qty 2

## 2014-12-08 MED ORDER — AZITHROMYCIN 250 MG PO TABS: ORAL_TABLET | ORAL | Status: DC

## 2014-12-08 MED ORDER — AMOXICILLIN 250 MG/5ML PO SUSR
500.0000 mg | Freq: Once | ORAL | Status: AC
  Administered 2014-12-08: 500 mg via ORAL
  Filled 2014-12-08: qty 10

## 2014-12-08 MED ORDER — SODIUM CHLORIDE 0.9 % IV BOLUS (SEPSIS)
1000.0000 mL | Freq: Once | INTRAVENOUS | Status: AC
  Administered 2014-12-08: 1000 mL via INTRAVENOUS

## 2014-12-08 MED ORDER — METOCLOPRAMIDE HCL 5 MG/ML IJ SOLN
10.0000 mg | Freq: Once | INTRAMUSCULAR | Status: AC
  Administered 2014-12-08: 10 mg via INTRAVENOUS
  Filled 2014-12-08: qty 2

## 2014-12-08 MED ORDER — ONDANSETRON 4 MG PO TBDP: ORAL_TABLET | ORAL | Status: DC

## 2014-12-08 MED ORDER — AMOXICILLIN-POT CLAVULANATE 875-125 MG PO TABS: 1.0000 | ORAL_TABLET | Freq: Two times a day (BID) | ORAL | Status: DC

## 2014-12-08 MED ORDER — ACETAMINOPHEN 325 MG PO TABS
650.0000 mg | ORAL_TABLET | Freq: Once | ORAL | Status: AC
  Administered 2014-12-08: 650 mg via ORAL
  Filled 2014-12-08: qty 2

## 2014-12-08 NOTE — ED Notes (Signed)
Pt states that she has been vomiting since yesterday. Pt denies abd pain, diarrhea or constipation.  Pt states that she is a diabetic.

## 2014-12-08 NOTE — Discharge Instructions (Signed)
Return to the emergency room with worsening of symptoms, new symptoms or with symptoms that are concerning, especially fevers not controlled with ibuprofen or Tylenol, coughing up blood, unable to tolerate fluids, severe headache with visual changes, slurred speech, weakness. Please take all of your antibiotics until finished!   You may develop abdominal discomfort or diarrhea from the antibiotic.  You may help offset this with probiotics which you can buy or get in yogurt. Do not eat  or take the probiotics until 2 hours after your antibiotic.

## 2014-12-08 NOTE — Telephone Encounter (Signed)
[facility] - Client TELEPHONE ADVICE RECORD [facility] Patient Name: Sarah BlareMICKEY Melander Gender: Female DOB: 09/10/1951 Age: [age] Y 4 M 17 D Return Phone Number: [phone] (Primary) Address: City/State/Zip: [place] Client [facility] - Client Client Site  [facility] Day Physician Derryl HarborKwiatkowski, Pete Contact Type Call Call Type Triage / Clinical Caller Name Reizy Relationship To Patient Self Return Phone Number [phone] (Primary) Chief Complaint Vomiting Initial Comment Caller states she has been vomiting. She is vomiting green. PreDisposition Go to ED Nurse Assessment Nurse: Tiburcio PeaHarris, RN, Meagan Date/Time Lamount Cohen(Eastern Time): 12/08/2014 1:34:07 PM Confirm and document reason for call. If symptomatic, describe symptoms. ---Caller states she has been vomiting. She is vomiting green. Caller states vomiting started yesterday evening. Unsure if she is running a fever. Has the patient traveled out of the country within the last 30 days? ---Not Applicable Does the patient require triage? ---Yes Related visit to physician within the last 2 weeks? ---No Does the PT have any chronic conditions? (i.e. diabetes, asthma, etc.) ---Yes List chronic conditions. ---Diabetic hypertension hyperlipidemia Guidelines Guideline Title Affirmed Question Affirmed Notes Nurse Date/Time Lamount Cohen(Eastern Time) Vomiting [1] MODERATE vomiting (e.g., 3 - 5 times/day) AND [2] age > 5360 Tiburcio PeaHarris, RN, Meagan 12/08/2014 1:35:12 PM Disp. Time Lamount Cohen(Eastern Time) Disposition Final User 12/08/2014 1:38:39 PM Go to ED Now (or PCP triage) Yes Tiburcio PeaHarris, RN, Meagan Caller Understands: Yes Disagree/Comply: Comply PLEASE NOTE: All timestamps contained within this report are represented as [place] Standard Time. CONFIDENTIALTY NOTICE: This fax transmission is intended only for the addressee. It contains information that is legally privileged,  confidential or otherwise protected from use or disclosure. If you are not the intended recipient, you are strictly prohibited from reviewing, disclosing, copying using or disseminating any of this information or taking any action in reliance on or regarding this information. If you have received this fax in error, please notify us immediately by telephone so that we can arrange for its return to us. Phone: [phone], Toll-Free: [phone], Fax: [phone] Page: 2 of 2 Call Id: [id number] Care Advice Given Per Guideline GO TO ED NOW (OR PCP TRIAGE): CONTAINER: You may wish to bring a bucket or container with you in case there is more vomiting during the drive. BRING MEDICINES: * Please bring a list of your current medicines when you go to see the doctor. CARE ADVICE per Vomiting (Adult) guideline. After Care Instructions Given Call Event Type User Date / Time Description Referrals [facility]

## 2014-12-08 NOTE — ED Provider Notes (Signed)
CSN: [id number]     Arrival date & time 12/08/14  1508 History   First MD Initiated Contact with Patient 12/08/14 1600     Chief Complaint  Patient presents with  . Emesis     (Consider location/radiation/quality/duration/timing/severity/associated sxs/prior Treatment) HPI  Sarah [name] is a 63 y.o. female with PMH of diabetes, hypertension, hyperlipidemia, chronic kidney disease, peripheral neuropathy, anxiety presenting with emesis since yesterday. Patient denies any blood or coffee grounds and emesis. She states emesis looks green. She denies any fevers, chills, abdominal pain, diarrhea or back pain. Patient denies abdominal surgeries she has had a tubal ligation. She does note rhinorrhea, sore throat, cough. At times she coughs so much that she vomits. At times her sputum looks thick and green. Patient denies taking anything for her symptoms. Patient denies back pain. She does note a headache after severe coughing spells. It is like other headaches she's had before. She denies visual changes, slurred speech, weakness anywhere. Last bowel movement yesterday and normal nonbloody. Patient denies any urinary symptoms. Her daughter who is at bedside admits to having URI symptoms as well.   Past Medical History  Diagnosis Date  . Diabetes mellitus   . Hypertension   . Hyperlipidemia   . Anxiety   . Chronic kidney disease   . Peripheral neuropathy    Past Surgical History  Procedure Laterality Date  . Tubal ligation    . Hemorrhoid surgery    . Neck surgery    . Cataract extraction, bilateral     Family History  Problem Relation Age of Onset  . Cancer Mother   . Hypertension Mother   . Diabetes Mother    History  Substance Use Topics  . Smoking status: Never Smoker   . Smokeless tobacco: Never Used  . Alcohol Use: No   OB History    No data available     Review of Systems  Constitutional: Negative for fever and chills.  HENT: Positive for congestion, rhinorrhea and  sore throat.   Eyes: Negative for visual disturbance.  Respiratory: Positive for cough. Negative for shortness of breath.   Cardiovascular: Negative for chest pain and palpitations.  Gastrointestinal: Positive for vomiting. Negative for nausea and diarrhea.  Genitourinary: Negative for dysuria and hematuria.  Musculoskeletal: Negative for back pain and gait problem.  Skin: Negative for rash.  Neurological: Positive for headaches. Negative for weakness.      Allergies  Cefaclor and Tetanus toxoid adsorbed  Home Medications   Prior to Admission medications   Medication Sig Start Date End Date Taking? Authorizing Provider  allopurinol (ZYLOPRIM) 100 MG tablet Take 1 tablet (100 mg total) by mouth daily. 09/30/14  Yes Gordy SaversPeter F Kwiatkowski, MD  amLODipine (NORVASC) 10 MG tablet TAKE 1 TABLET EVERY DAY 01/25/14  Yes Gordy SaversPeter F Kwiatkowski, MD  aspirin EC 81 MG tablet TAKE 1 TABLET (81 MG TOTAL) BY MOUTH DAILY. SWALLOW WHOLE. 09/30/14  Yes Gordy SaversPeter F Kwiatkowski, MD  atorvastatin (LIPITOR) 80 MG tablet Take 1 tablet (80 mg total) by mouth daily. 04/11/14  Yes Gordy SaversPeter F Kwiatkowski, MD  Blood Glucose Monitoring Suppl (PRODIGY AUTOCODE BLOOD GLUCOSE) DEVI As directed qid 09/02/12  Yes Gordy SaversPeter F Kwiatkowski, MD  colchicine (COLCRYS) 0.6 MG tablet Take 1 tablet (0.6 mg total) by mouth daily. 04/08/14  Yes Gordy SaversPeter F Kwiatkowski, MD  cyclobenzaprine (FLEXERIL) 5 MG tablet Take 1 tablet (5 mg total) by mouth 3 (three) times daily as needed for muscle spasms. 09/30/14  Yes Gordy SaversPeter F Kwiatkowski,  MD  glucose blood (PRODIGY NO CODING BLOOD GLUC) test strip TEST  SUGARS BEFORE MEALS  AND AT BEDTIME 07/11/14  Yes Gordy SaversPeter F Kwiatkowski, MD  HYDROcodone-acetaminophen [facility](NORCO) 10-325 MG per tablet Take 0.5-1 tablets by mouth every 8 (eight) hours as needed. 11/29/14  Yes Roderick PeeJeffrey A Todd, MD  insulin glargine (LANTUS) 100 UNIT/ML injection Inject 20 Units into the skin at bedtime. 20 units at bedtime 08/31/12  Yes Gordy SaversPeter F Kwiatkowski,  MD  insulin lispro (HUMALOG KWIKPEN) 100 UNIT/ML KiwkPen INJECT 10-16 UNITS INTO THE SKIN 3 (THREE) TIMES DAILY BEFORE MEALS. 11/16/14  Yes Shelva MajesticStephen O Hunter, MD  Insulin Pen Needle (NOVOFINE) 30G X 8 MM MISC Inject 1 packet into the skin as needed.     Yes Historical Provider, MD  lidocaine (LIDODERM) 5 % Place 1 patch onto the skin daily. APPLY 1 PATCH TO THE AFFECTED AREA AND LEAVE IN PLACE FOR 12 HOURS, THEN REMOVE AND LEAVE OFF FOR 12 HOURS 10/04/14  Yes Historical Provider, MD  potassium bicarbonate (K-LYTE) 25 MEQ disintegrating tablet TAKE ONE TABLET BY MOUTH IN 6 OUNCES OF WATER DAILY 10/18/13  Yes Gordy SaversPeter F Kwiatkowski, MD  Alcohol Swabs (B-D SINGLE USE SWABS REGULAR) PADS Use as directed 06/29/14   Gordy SaversPeter F Kwiatkowski, MD  amoxicillin-clavulanate (AUGMENTIN) 875-125 MG per tablet Take 1 tablet by mouth every 12 (twelve) hours. 12/08/14   Louann SjogrenVictoria L Kierston Plasencia, PA-C  azithromycin (ZITHROMAX) 250 MG tablet Take 1 every day until finished. 12/08/14   Louann SjogrenVictoria L Shed Nixon, PA-C  ezetimibe (ZETIA) 10 MG tablet Take 1 tablet (10 mg total) by mouth daily. Patient not taking: Reported on 12/08/2014 07/11/14   Gordy SaversPeter F Kwiatkowski, MD  Lancet Devices (PRODIGY LANCING DEVICE) MISC 1 each by Does not apply route once. 06/29/14   Gordy SaversPeter F Kwiatkowski, MD  LORazepam (ATIVAN) 0.5 MG tablet Take 1 tablet (0.5 mg total) by mouth 2 (two) times daily as needed for anxiety. Patient not taking: Reported on 12/08/2014 01/07/14   Gordy SaversPeter F Kwiatkowski, MD  ondansetron [facility](ZOFRAN ODT) 4 MG disintegrating tablet 4mg  ODT q4 hours prn nausea/vomit 12/08/14   Benetta SparVictoria L Hau Sanor, PA-C   BP 146/89 mmHg  Pulse 88  Temp(Src) 98.2 F (36.8 C) (Oral)  Resp 18  SpO2 98% Physical Exam  Constitutional: She is oriented to person, place, and time. She appears well-developed and well-nourished. No distress.  HENT:  Head: Normocephalic and atraumatic.  Mouth/Throat: Posterior oropharyngeal edema and posterior oropharyngeal erythema present. No  oropharyngeal exudate.  Eyes: Conjunctivae and EOM are normal. Pupils are equal, round, and reactive to light. Right eye exhibits no discharge. Left eye exhibits no discharge.  Neck: Normal range of motion. Neck supple.  No nuchal rigidity  Cardiovascular: Normal rate and regular rhythm.   Pulmonary/Chest: Effort normal and breath sounds normal. No respiratory distress. She has no wheezes.  Abdominal: Soft. Bowel sounds are normal. She exhibits no distension. There is no tenderness.  Neurological: She is alert and oriented to person, place, and time. No cranial nerve deficit. Coordination normal.  Speech is clear and goal oriented. Peripheral visual fields intact. Strength 5/5 in upper and lower extremities. Sensation intact. No pronator drift. Normal gait.    Skin: Skin is warm and dry. She is not diaphoretic.  Nursing note and vitals reviewed.   ED Course  Procedures (including critical care time) Labs Review Labs Reviewed  COMPREHENSIVE METABOLIC PANEL - Abnormal; Notable for the following:    Potassium 3.4 (*)    Glucose, Bld 224 (*)  BUN 28 (*)    Creatinine, Ser 2.25 (*)    Total Protein 8.4 (*)    GFR calc non Af Amer 22 (*)    GFR calc Af Amer 25 (*)    All other components within normal limits  CBG MONITORING, ED - Abnormal; Notable for the following:    Glucose-Capillary 199 (*)    All other components within normal limits  CBC WITH DIFFERENTIAL  LIPASE, BLOOD    Imaging Review Dg Chest 2 View  12/08/2014   CLINICAL DATA:  63 year old female with cough, nausea, vomiting and headache for the past 2 days  EXAM: CHEST  2 VIEW  COMPARISON:  Prior chest x-ray 10/04/2012  FINDINGS: Patchy right middle lobe opacity partially obscures the right heart border. The opacity can also be seen on the lateral view inferiorly within the middle lobe. Findings are concerning for bronchopneumonia. Otherwise, the lungs are clear. No pleural effusion or pneumothorax. Cardiac and  mediastinal contours remain within normal limits. Incompletely imaged anterior cervical fusion hardware. No acute osseous abnormality.  IMPRESSION: Patchy right middle lobe opacity concerning for pneumonia. Recommend imaging followup (repeat chest x-ray in 4- 6 weeks following appropriate course of therapy) to resolution to exclude underlying malignancy.   Electronically Signed   By: Malachy Moan M.D.   On: 12/08/2014 16:41     EKG Interpretation None      Meds given in ED:  Medications  ondansetron (ZOFRAN-ODT) disintegrating tablet 8 mg (8 mg Oral Given 12/08/14 1535)  metoCLOPramide (REGLAN) injection 10 mg (10 mg Intravenous Given 12/08/14 1656)  sodium chloride 0.9 % bolus 1,000 mL (0 mLs Intravenous Stopped 12/08/14 1911)  acetaminophen (TYLENOL) tablet 650 mg (650 mg Oral Given 12/08/14 1804)  azithromycin (ZITHROMAX) tablet 500 mg (500 mg Oral Given 12/08/14 1804)  amoxicillin (AMOXIL) 250 MG/5ML suspension 500 mg (500 mg Oral Given 12/08/14 1910)    New Prescriptions   AMOXICILLIN-CLAVULANATE (AUGMENTIN) 875-125 MG PER TABLET    Take 1 tablet by mouth every 12 (twelve) hours.   AZITHROMYCIN (ZITHROMAX) 250 MG TABLET    Take 1 every day until finished.   ONDANSETRON (ZOFRAN ODT) 4 MG DISINTEGRATING TABLET    4mg  ODT q4 hours prn nausea/vomit       MDM   Final diagnoses:  Cough  CAP (community acquired pneumonia)  Nausea and vomiting, vomiting of unspecified type   Patient with history of diabetes, CKD on insulin coming in with one-day history of nausea/vomiting as well as URI symptoms. VSS. Completely benign abdominal exam. Patient tolerating fluids in ED. White count within normal limits as well as electrolytes. Chest x-ray shows developing right middle lobe opacity concerning for pneumonia. Patient has not had recent hospitalizations. We'll treat with Augmentin and azithromycin. Patient with difficulty swallowing Augmentin pill. She was given suspension of amoxicillin  in ED. Will discharge with Augmentin and azithromycin. Patient given instructions for swallowing pills at home. Patient is afebrile, nontoxic, and in no acute distress. Patient is appropriate for outpatient management and is stable for discharge. Patient to follow-up with primary care provider in one week.  Discussed return precautions with patient. Discussed all results and patient verbalizes understanding and agrees with plan.  Case has been discussed with Dr. Juleen China who agrees with the above plan and to discharge.     Louann Sjogren, PA-C 12/08/14 1926  Raeford Razor, MD 12/12/14 [phone]

## 2014-12-08 NOTE — Telephone Encounter (Signed)
Noted  

## 2014-12-08 NOTE — ED Notes (Signed)
Let the pt know a urine sample was needed from her

## 2014-12-22 ENCOUNTER — Telehealth: Payer: Self-pay

## 2014-12-22 MED ORDER — ACCU-CHEK NANO SMARTVIEW W/DEVICE KIT: PACK | Status: DC

## 2014-12-22 MED ORDER — GLUCOSE BLOOD VI STRP: ORAL_STRIP | Status: DC

## 2014-12-22 NOTE — Telephone Encounter (Signed)
[facility] New Rx request for ACCU-CHEK NANO SMARTVIEW METER and ACCU-CHEK SMARTVIEW TEST STRIP

## 2014-12-22 NOTE — Telephone Encounter (Signed)
[facility] to [facility]

## 2015-01-02 DIAGNOSIS — M7542 Impingement syndrome of left shoulder: Secondary | ICD-10-CM | POA: Insufficient documentation

## 2015-01-02 DIAGNOSIS — E119 Type 2 diabetes mellitus without complications: Secondary | ICD-10-CM | POA: Insufficient documentation

## 2015-01-02 DIAGNOSIS — M25512 Pain in left shoulder: Secondary | ICD-10-CM

## 2015-01-02 DIAGNOSIS — G8929 Other chronic pain: Secondary | ICD-10-CM | POA: Insufficient documentation

## 2015-01-05 ENCOUNTER — Ambulatory Visit: Payer: Commercial Managed Care - HMO | Admitting: Internal Medicine

## 2015-01-09 ENCOUNTER — Encounter: Payer: Self-pay | Admitting: Internal Medicine

## 2015-01-09 ENCOUNTER — Encounter: Payer: Self-pay | Admitting: *Deleted

## 2015-01-09 ENCOUNTER — Ambulatory Visit (INDEPENDENT_AMBULATORY_CARE_PROVIDER_SITE_OTHER): Payer: Commercial Managed Care - HMO | Admitting: Internal Medicine

## 2015-01-09 VITALS — BP 130/76 | HR 65 | Temp 97.7°F | Resp 20 | Ht 63.0 in | Wt 170.0 lb

## 2015-01-09 DIAGNOSIS — E0821 Diabetes mellitus due to underlying condition with diabetic nephropathy: Secondary | ICD-10-CM | POA: Diagnosis not present

## 2015-01-09 DIAGNOSIS — E7800 Pure hypercholesterolemia, unspecified: Secondary | ICD-10-CM

## 2015-01-09 DIAGNOSIS — I10 Essential (primary) hypertension: Secondary | ICD-10-CM

## 2015-01-09 DIAGNOSIS — N184 Chronic kidney disease, stage 4 (severe): Secondary | ICD-10-CM

## 2015-01-09 LAB — LIPID PANEL
Cholesterol: 270 mg/dL — ABNORMAL HIGH (ref 0–200)
HDL: 54 mg/dL (ref >39.00)
NONHDL: 216
TRIGLYCERIDES: 275 mg/dL — AB (ref 0.0–149.0)
Total CHOL/HDL Ratio: 5
VLDL: 55 mg/dL — AB (ref 0.0–40.0)

## 2015-01-09 LAB — LDL CHOLESTEROL, DIRECT: Direct LDL: 177 mg/dL

## 2015-01-09 LAB — MICROALBUMIN / CREATININE URINE RATIO
Creatinine,U: 242.2 mg/dL
MICROALB/CREAT RATIO: 48.2 mg/g — AB (ref 0.0–30.0)
Microalb, Ur: 116.8 mg/dL — ABNORMAL HIGH (ref 0.0–1.9)

## 2015-01-09 LAB — HEMOGLOBIN A1C: HEMOGLOBIN A1C: 7.2 % — AB (ref 4.6–6.5)

## 2015-01-09 MED ORDER — INSULIN GLARGINE 100 UNIT/ML ~~LOC~~ SOLN: SUBCUTANEOUS | Status: DC

## 2015-01-09 MED ORDER — HYDROCODONE-ACETAMINOPHEN 10-325 MG PO TABS: 0.5000 | ORAL_TABLET | Freq: Three times a day (TID) | ORAL | Status: DC | PRN

## 2015-01-09 NOTE — Patient Instructions (Signed)
Decrease Lantus to 10 units daily  Follow-up with renal medicine as discussed  Limit your sodium (Salt) intake   Please check your hemoglobin A1c every 3 months    It is important that you exercise regularly, at least 20 minutes 3 to 4 times per week.  If you develop chest pain or shortness of breath seek  medical attention.

## 2015-01-09 NOTE — Progress Notes (Signed)
Subjective:    Patient ID: Sarah [name], female    DOB: 26-Nov-1951, 64 y.o.   MRN: [id number]  HPI  64 year old patient who has diabetes complicated by significant chronic kidney disease.  Recent creatinine 2.25 with creatinine clearance of 25.  She has been very resistant to nephrology referral.  This was discussed at length today and strongly encouraged She has hypertension, dyslipidemia and a history of cerebrovascular disease  Hemoglobin A1c's have generally been well controlled.  There have been some compliance issues.  Due to cost.  She has not been very compliant with the Lantus due to cost considerations.  Total cholesterol elevated in spite of high-dose statin therapy.  Denies any focal neurological or cardiopulmonary complaints.  Main complaint is knee pain for which she takes when necessary Vicodin  She states fasting blood sugars are occasionally in the sixties and seventies in spite of inconsistent use of Lantus.  She remains on Humalog that she takes 10-15 units prior to each meal 3 times daily  Past Medical History  Diagnosis Date  . Diabetes mellitus   . Hypertension   . Hyperlipidemia   . Anxiety   . Chronic kidney disease   . Peripheral neuropathy     History   Social History  . Marital Status: Single    Spouse Name: N/A    Number of Children: N/A  . Years of Education: N/A   Occupational History  . Not on file.   Social History Main Topics  . Smoking status: Never Smoker   . Smokeless tobacco: Never Used  . Alcohol Use: No  . Drug Use: No  . Sexual Activity: Not on file   Other Topics Concern  . Not on file   Social History Narrative    Past Surgical History  Procedure Laterality Date  . Tubal ligation    . Hemorrhoid surgery    . Neck surgery    . Cataract extraction, bilateral      Family History  Problem Relation Age of Onset  . Cancer Mother   . Hypertension Mother   . Diabetes Mother     Allergies  Allergen Reactions  .  Cefaclor Hives  . Tetanus Toxoid Adsorbed Swelling    Current Outpatient Prescriptions on File Prior to Visit  Medication Sig Dispense Refill  . Alcohol Swabs (B-D SINGLE USE SWABS REGULAR) PADS Use as directed 100 each 4  . allopurinol (ZYLOPRIM) 100 MG tablet Take 1 tablet (100 mg total) by mouth daily. 90 tablet 1  . amLODipine (NORVASC) 10 MG tablet TAKE 1 TABLET EVERY DAY 90 tablet 1  . aspirin EC 81 MG tablet TAKE 1 TABLET (81 MG TOTAL) BY MOUTH DAILY. SWALLOW WHOLE. 90 tablet 1  . atorvastatin (LIPITOR) 80 MG tablet Take 1 tablet (80 mg total) by mouth daily. 90 tablet 1  . Blood Glucose Monitoring Suppl (ACCU-CHEK NANO SMARTVIEW) W/DEVICE KIT USE AS DIRECTED 1 kit 0  . colchicine (COLCRYS) 0.6 MG tablet Take 1 tablet (0.6 mg total) by mouth daily. 30 tablet 2  . cyclobenzaprine (FLEXERIL) 5 MG tablet Take 1 tablet (5 mg total) by mouth 3 (three) times daily as needed for muscle spasms. 90 tablet 1  . ezetimibe (ZETIA) 10 MG tablet Take 1 tablet (10 mg total) by mouth daily. 90 tablet 1  . glucose blood (ACCU-CHEK SMARTVIEW) test strip USE TO CHECK BLOOD SUGAR BEFORE MEALS AND AT BEDTIME 400 each 3  . insulin lispro (HUMALOG KWIKPEN) 100 UNIT/ML KiwkPen INJECT  10-16 UNITS INTO THE SKIN 3 (THREE) TIMES DAILY BEFORE MEALS. 15 pen 5  . Insulin Pen Needle (NOVOFINE) 30G X 8 MM MISC Inject 1 packet into the skin as needed.      Elmore Guise Devices (PRODIGY LANCING DEVICE) MISC 1 each by Does not apply route once. 1 each 1  . lidocaine (LIDODERM) 5 % Place 1 patch onto the skin daily. APPLY 1 PATCH TO THE AFFECTED AREA AND LEAVE IN PLACE FOR 12 HOURS, THEN REMOVE AND LEAVE OFF FOR 12 HOURS    . LORazepam (ATIVAN) 0.5 MG tablet Take 1 tablet (0.5 mg total) by mouth 2 (two) times daily as needed for anxiety. 180 tablet 1  . potassium bicarbonate (K-LYTE) 25 MEQ disintegrating tablet TAKE ONE TABLET BY MOUTH IN 6 OUNCES OF WATER DAILY 90 tablet 1   No current facility-administered medications on  file prior to visit.    BP 130/76 mmHg  Pulse 65  Temp(Src) 97.7 F (36.5 C) (Oral)  Resp 20  Ht _0  (1.6 m)  Wt 170 lb (77.111 kg)  BMI 30.12 kg/m2  SpO2 96%     Review of Systems  Constitutional: Negative.   HENT: Negative for congestion, dental problem, hearing loss, rhinorrhea, sinus pressure, sore throat and tinnitus.   Eyes: Negative for pain, discharge and visual disturbance.  Respiratory: Negative for cough and shortness of breath.   Cardiovascular: Negative for chest pain, palpitations and leg swelling.  Gastrointestinal: Negative for nausea, vomiting, abdominal pain, diarrhea, constipation, blood in stool and abdominal distention.  Genitourinary: Negative for dysuria, urgency, frequency, hematuria, flank pain, vaginal bleeding, vaginal discharge, difficulty urinating, vaginal pain and pelvic pain.  Musculoskeletal: Positive for arthralgias and gait problem. Negative for joint swelling.  Skin: Negative for rash.  Neurological: Negative for dizziness, syncope, speech difficulty, weakness, numbness and headaches.  Hematological: Negative for adenopathy.  Psychiatric/Behavioral: Negative for behavioral problems, dysphoric mood and agitation. The patient is not nervous/anxious.        Objective:   Physical Exam  Constitutional: She is oriented to person, place, and time. She appears well-developed and well-nourished.  Blood pressure 130/70  HENT:  Head: Normocephalic.  Right Ear: External ear normal.  Left Ear: External ear normal.  Mouth/Throat: Oropharynx is clear and moist.  Eyes: Conjunctivae and EOM are normal. Pupils are equal, round, and reactive to light.  Neck: Normal range of motion. Neck supple. No thyromegaly present.  Cardiovascular: Normal rate, regular rhythm, normal heart sounds and intact distal pulses.   Pulmonary/Chest: Effort normal and breath sounds normal.  Abdominal: Soft. Bowel sounds are normal. She exhibits no mass. There is no tenderness.   Musculoskeletal: Normal range of motion.  Lymphadenopathy:    She has no cervical adenopathy.  Neurological: She is alert and oriented to person, place, and time.  Skin: Skin is warm and dry. No rash noted.  Psychiatric: She has a normal mood and affect. Her behavior is normal.          Assessment & Plan:   Diabetes mellitus.  Will check a hemoglobin A1c.  We'll decrease Lantus to 10 units daily.  May need to consider alternative treatment if cost becomes more of a factor. Hypertension, stable Dyslipidemia.  Check lipid profile Chronic kidney disease.  Nephrology referral.  Again encouraged.  Will schedule  Return in 3 months for follow-up Continue biannual ophthalmology follow-up.  No history of diabetic retinopathy

## 2015-01-09 NOTE — Progress Notes (Signed)
Pre visit review using our clinic review tool, if applicable. No additional management support is needed unless otherwise documented below in the visit note. 

## 2015-01-11 ENCOUNTER — Other Ambulatory Visit: Payer: Self-pay

## 2015-01-11 MED ORDER — INSULIN GLARGINE 100 UNIT/ML ~~LOC~~ SOLN: SUBCUTANEOUS | Status: DC

## 2015-01-11 NOTE — Telephone Encounter (Signed)
Received a fax from [name] for Lantus- needing clarification on whether pt uses pens or vials. Called and spoke with pt and pt states she uses pens.  New rx sent to pharmacy.

## 2015-01-18 ENCOUNTER — Telehealth: Payer: Self-pay | Admitting: Internal Medicine

## 2015-01-18 NOTE — Telephone Encounter (Signed)
Patient would like to know if the handicap paperwork dropped off on Monday is ready for pick-up??

## 2015-01-18 NOTE — Telephone Encounter (Signed)
Called and spoke with pt and advised that handicap parking paperwork is ready for pick up and there is no charge.  Pt verbalized understanding and will pick up paperwork.

## 2015-01-25 ENCOUNTER — Telehealth: Payer: Self-pay | Admitting: Internal Medicine

## 2015-01-25 NOTE — Telephone Encounter (Signed)
Pt called to say that she has the following machine Blood Glucose Monitoring Suppl (ACCU-CHEK NANO SMARTVIEW) W/DEVICE KIT    She called to say that she does not have the stick needles that you use to stick finger. She is asking if you know where she can get these pens from.

## 2015-01-26 NOTE — Telephone Encounter (Signed)
Spoke to pt, asked her does she mean lancets. Pt said yes, but she called [employer] and they are sending her some. Told her okay if needs anything else just call. Pt verbalized understanding.

## 2015-01-31 ENCOUNTER — Other Ambulatory Visit: Payer: Self-pay

## 2015-01-31 DIAGNOSIS — E0821 Diabetes mellitus due to underlying condition with diabetic nephropathy: Secondary | ICD-10-CM

## 2015-01-31 MED ORDER — ACCU-CHEK FASTCLIX LANCETS MISC: Status: DC

## 2015-01-31 NOTE — Telephone Encounter (Signed)
Rx request for Accu-chek fastclix lancets  Pharm:  [facility]  Rx sent to pharmacy

## 2015-02-06 ENCOUNTER — Telehealth: Payer: Self-pay | Admitting: Internal Medicine

## 2015-02-06 NOTE — Telephone Encounter (Signed)
Pt request refill HYDROcodone-acetaminophen (NORCO) 10-325 MG per tablet °

## 2015-02-07 MED ORDER — HYDROCODONE-ACETAMINOPHEN 10-325 MG PO TABS: 0.5000 | ORAL_TABLET | Freq: Three times a day (TID) | ORAL | Status: DC | PRN

## 2015-02-07 NOTE — Telephone Encounter (Signed)
Left message Rx ready for pickup, will be at the front desk. Rx printed and signed.  

## 2015-03-03 DIAGNOSIS — E1122 Type 2 diabetes mellitus with diabetic chronic kidney disease: Secondary | ICD-10-CM | POA: Diagnosis not present

## 2015-03-03 DIAGNOSIS — I10 Essential (primary) hypertension: Secondary | ICD-10-CM | POA: Diagnosis not present

## 2015-03-03 DIAGNOSIS — E118 Type 2 diabetes mellitus with unspecified complications: Secondary | ICD-10-CM | POA: Diagnosis not present

## 2015-03-03 DIAGNOSIS — N183 Chronic kidney disease, stage 3 (moderate): Secondary | ICD-10-CM | POA: Diagnosis not present

## 2015-03-05 ENCOUNTER — Other Ambulatory Visit: Payer: Self-pay | Admitting: Internal Medicine

## 2015-03-07 ENCOUNTER — Other Ambulatory Visit: Payer: Self-pay | Admitting: *Deleted

## 2015-03-07 MED ORDER — POTASSIUM CHLORIDE CRYS ER 20 MEQ PO TBCR: 20.0000 meq | EXTENDED_RELEASE_TABLET | Freq: Every day | ORAL | Status: DC

## 2015-03-15 ENCOUNTER — Telehealth: Payer: Self-pay | Admitting: Internal Medicine

## 2015-03-15 MED ORDER — HYDROCODONE-ACETAMINOPHEN 10-325 MG PO TABS: 0.5000 | ORAL_TABLET | Freq: Three times a day (TID) | ORAL | Status: DC | PRN

## 2015-03-15 NOTE — Telephone Encounter (Signed)
Pt notified Rx ready for pickup. Rx printed and signed.  

## 2015-03-15 NOTE — Telephone Encounter (Signed)
Pt needs new rx hydrocodone °

## 2015-04-02 ENCOUNTER — Other Ambulatory Visit: Payer: Self-pay | Admitting: Internal Medicine

## 2015-04-05 ENCOUNTER — Ambulatory Visit (INDEPENDENT_AMBULATORY_CARE_PROVIDER_SITE_OTHER): Payer: Commercial Managed Care - HMO | Admitting: Emergency Medicine

## 2015-04-05 VITALS — BP 164/94 | HR 76 | Temp 97.5°F | Resp 18 | Ht 63.25 in | Wt 173.6 lb

## 2015-04-05 DIAGNOSIS — J340 Abscess, furuncle and carbuncle of nose: Secondary | ICD-10-CM

## 2015-04-05 MED ORDER — SULFAMETHOXAZOLE-TRIMETHOPRIM 800-160 MG PO TABS: 1.0000 | ORAL_TABLET | Freq: Two times a day (BID) | ORAL | Status: DC

## 2015-04-05 MED ORDER — MUPIROCIN 2 % EX OINT: 1.0000 "application " | TOPICAL_OINTMENT | Freq: Three times a day (TID) | CUTANEOUS | Status: DC

## 2015-04-05 NOTE — Progress Notes (Signed)
[facility] [address] [place] [address] [phone]  Date:  04/05/2015   Name:  Sarah [name]   DOB:  05/17/1951   MRN:  [id number]  PCP:  Nyoka Cowden, MD    Chief Complaint: Facial Swelling   History of Present Illness:  Sarah [name] is a 64 y.o. very pleasant female patient who presents with the following:  Patient has a two week history of pain in her nose No history of injury No URI symptoms with blowing Saturday had a nosebleed that stopped promptly. Now has pain and swelling and redness of nose. Thinks she has a sore in her nose No fever or chills No improvement with over the counter medications or other home remedies.  Denies other complaint or health concern today.   Patient Active Problem List   Diagnosis Date Noted  . BURSITIS, ELBOW 04/02/2010  . TENDINITIS, RIGHT ELBOW 03/28/2010  . Disorder of kidney and ureter 06/29/2009  . URI 09/27/2008  . ANXIETY 07/01/2008  . Diabetes mellitus with renal complications 03/50/0938  . Pure hypercholesterolemia 05/22/2007  . Essential hypertension 05/22/2007  . CEREBROVASCULAR ACCIDENT, HX OF 05/22/2007  . ARTHROSCOPY, KNEE, HX OF 05/22/2007    Past Medical History  Diagnosis Date  . Diabetes mellitus   . Hypertension   . Hyperlipidemia   . Anxiety   . Chronic kidney disease   . Peripheral neuropathy   . Allergy   . Arthritis   . Cataract   . Heart murmur   . Stroke     Past Surgical History  Procedure Laterality Date  . Tubal ligation    . Hemorrhoid surgery    . Neck surgery    . Cataract extraction, bilateral    . Appendectomy    . Eye surgery    . Fracture surgery      L foot  . Spine surgery      cervical spine    History  Substance Use Topics  . Smoking status: Never Smoker   . Smokeless tobacco: Never Used  . Alcohol Use: No    Family History  Problem Relation Age of Onset  . Cancer Mother   . Hypertension Mother   . Diabetes Mother   .  Heart disease Father     Allergies  Allergen Reactions  . Cefaclor Hives  . Tetanus Toxoid Adsorbed Swelling    Medication list has been reviewed and updated.  Current Outpatient Prescriptions on File Prior to Visit  Medication Sig Dispense Refill  . ACCU-CHEK FASTCLIX LANCETS MISC Use daily. 102 each 5  . Alcohol Swabs (B-D SINGLE USE SWABS REGULAR) PADS Use as directed 100 each 4  . allopurinol (ZYLOPRIM) 100 MG tablet Take 1 tablet (100 mg total) by mouth daily. 90 tablet 1  . amLODipine (NORVASC) 10 MG tablet TAKE 1 TABLET EVERY DAY 90 tablet 1  . aspirin EC 81 MG tablet TAKE 1 TABLET (81 MG TOTAL) BY MOUTH DAILY. SWALLOW WHOLE. 90 tablet 1  . atorvastatin (LIPITOR) 80 MG tablet Take 1 tablet (80 mg total) by mouth daily. 90 tablet 1  . Blood Glucose Monitoring Suppl (ACCU-CHEK NANO SMARTVIEW) W/DEVICE KIT USE AS DIRECTED 1 kit 0  . colchicine (COLCRYS) 0.6 MG tablet Take 1 tablet (0.6 mg total) by mouth daily. 30 tablet 2  . cyclobenzaprine (FLEXERIL) 5 MG tablet Take 1 tablet (5 mg total) by mouth 3 (three) times daily as needed for muscle spasms. 90 tablet 1  .  glucose blood (ACCU-CHEK SMARTVIEW) test strip USE TO CHECK BLOOD SUGAR BEFORE MEALS AND AT BEDTIME 400 each 3  . HUMALOG KWIKPEN 100 UNIT/ML KiwkPen INJECT 10-16 UNITS INTO THE SKIN 3 (THREE) TIMES DAILY BEFORE MEALS. 15 pen 2  . HYDROcodone-acetaminophen (NORCO) 10-325 MG per tablet Take 0.5-1 tablets by mouth every 8 (eight) hours as needed. 90 tablet 0  . insulin glargine (LANTUS) 100 UNIT/ML injection 10 units at bedtime 15 mL 6  . insulin lispro (HUMALOG KWIKPEN) 100 UNIT/ML KiwkPen INJECT 10-16 UNITS INTO THE SKIN 3 (THREE) TIMES DAILY BEFORE MEALS. 15 pen 5  . Insulin Pen Needle (NOVOFINE) 30G X 8 MM MISC Inject 1 packet into the skin as needed.      Marland Kitchen KLOR-CON/EF 25 MEQ disintegrating tablet TAKE ONE TABLET BY MOUTH IN 6 OUNCES OF WATER DAILY 90 tablet 1  . Lancet Devices (PRODIGY LANCING DEVICE) MISC 1 each by  Does not apply route once. 1 each 1  . lidocaine (LIDODERM) 5 % Place 1 patch onto the skin daily. APPLY 1 PATCH TO THE AFFECTED AREA AND LEAVE IN PLACE FOR 12 HOURS, THEN REMOVE AND LEAVE OFF FOR 12 HOURS    . LORazepam (ATIVAN) 0.5 MG tablet Take 1 tablet (0.5 mg total) by mouth 2 (two) times daily as needed for anxiety. 180 tablet 1  . ezetimibe (ZETIA) 10 MG tablet Take 1 tablet (10 mg total) by mouth daily. (Patient not taking: Reported on 04/05/2015) 90 tablet 1   No current facility-administered medications on file prior to visit.    Review of Systems:  As per HPI, otherwise negative.    Physical Examination: Filed Vitals:   04/05/15 1933  BP: 164/94  Pulse: 76  Temp: 97.5 F (36.4 C)  Resp: 18   Filed Vitals:   04/05/15 1933  Height: 5' 3.25" (1.607 m)  Weight: 173 lb 9.6 oz (78.744 kg)   Body mass index is 30.49 kg/(m^2). Ideal Body Weight: Weight in (lb) to have BMI = 25: 142  GEN: WDWN, NAD, Non-toxic, A & O x 3 HEENT: Atraumatic, Normocephalic. Neck supple. No masses, No LAD. Ears and Nose: No external deformity.  Left alar-septal abscess with cellulitis nose CV: RRR, No M/G/R. No JVD. No thrill. No extra heart sounds. PULM: CTA B, no wheezes, crackles, rhonchi. No retractions. No resp. distress. No accessory muscle use. ABD: S, NT, ND, +BS. No rebound. No HSM. EXTR: No c/c/e NEURO Normal gait.  PSYCH: Normally interactive. Conversant. Not depressed or anxious appearing.  Calm demeanor.    Assessment and Plan: Cellulitis and abscess nose bactroban Septra  Signed,  Ellison Carwin, MD

## 2015-04-05 NOTE — Patient Instructions (Signed)

## 2015-04-06 ENCOUNTER — Telehealth: Payer: Self-pay | Admitting: Internal Medicine

## 2015-04-06 NOTE — Telephone Encounter (Signed)
Pt said she has some questions about a rx that she received from [facility] and is asking if you would call her

## 2015-04-07 MED ORDER — CYCLOBENZAPRINE HCL 5 MG PO TABS: 5.0000 mg | ORAL_TABLET | Freq: Three times a day (TID) | ORAL | Status: DC | PRN

## 2015-04-07 NOTE — Telephone Encounter (Signed)
Spoke to pt, had questions about antibiotics she was given at [facility] on 4/20 did not start due to never taken. Told pt you were given Septra and Bactroban ointment for infection in your nose. Told pt need to take antibiotic and use ointment so infection goes away. Told her they would not of prescribed the medication if she did not need it. Told pt okay to take. Pt verbalized understanding and also needs refill on Flexeril. Told pt okay will send to pharmacy. Pt verbalized understanding.

## 2015-04-07 NOTE — Telephone Encounter (Signed)
Left message on voicemail to call office.  

## 2015-04-10 ENCOUNTER — Ambulatory Visit: Payer: Commercial Managed Care - HMO | Admitting: Internal Medicine

## 2015-04-17 ENCOUNTER — Ambulatory Visit (INDEPENDENT_AMBULATORY_CARE_PROVIDER_SITE_OTHER): Payer: Commercial Managed Care - HMO | Admitting: Internal Medicine

## 2015-04-17 ENCOUNTER — Encounter: Payer: Self-pay | Admitting: Internal Medicine

## 2015-04-17 ENCOUNTER — Other Ambulatory Visit: Payer: Self-pay | Admitting: *Deleted

## 2015-04-17 VITALS — BP 150/70 | HR 66 | Temp 97.9°F | Resp 20 | Ht 63.25 in | Wt 172.0 lb

## 2015-04-17 DIAGNOSIS — I10 Essential (primary) hypertension: Secondary | ICD-10-CM | POA: Diagnosis not present

## 2015-04-17 DIAGNOSIS — E7800 Pure hypercholesterolemia, unspecified: Secondary | ICD-10-CM

## 2015-04-17 DIAGNOSIS — E0821 Diabetes mellitus due to underlying condition with diabetic nephropathy: Secondary | ICD-10-CM

## 2015-04-17 LAB — HEMOGLOBIN A1C: Hgb A1c MFr Bld: 7.5 % — ABNORMAL HIGH (ref 4.6–6.5)

## 2015-04-17 MED ORDER — HYDROCODONE-ACETAMINOPHEN 10-325 MG PO TABS: 0.5000 | ORAL_TABLET | Freq: Three times a day (TID) | ORAL | Status: DC | PRN

## 2015-04-17 MED ORDER — METOPROLOL SUCCINATE ER 50 MG PO TB24: 50.0000 mg | ORAL_TABLET | Freq: Every day | ORAL | Status: DC

## 2015-04-17 NOTE — Patient Instructions (Signed)
Limit your sodium (Salt) intake    It is important that you exercise regularly, at least 20 minutes 3 to 4 times per week.  If you develop chest pain or shortness of breath seek  medical attention.   Please check your hemoglobin A1c every 3 months  Please check your blood pressure on a regular basis.  If it is consistently greater than 150/90, please make an office appointment.  Please see your eye doctor yearly to check for diabetic eye damage  

## 2015-04-17 NOTE — Progress Notes (Signed)
Pre visit review using our clinic review tool, if applicable. No additional management support is needed unless otherwise documented below in the visit note. 

## 2015-04-17 NOTE — Progress Notes (Signed)
 Subjective:    Patient ID: Sarah [name], female    DOB: 02/11/1951, 64 y.o.   MRN: [id number]  HPI  Lab Results  Component Value Date   HGBA1C 7.2* 01/09/2015   64-year-old patient who is seen today for follow-up of type 2 diabetes.  She is followed by renal medicine due to diabetic renal disease.  She has essential hypertension and dyslipidemia.  Remains on amlodipine with persistently elevation in systolic readings.  No cardiopulmonary complaints.  She is scheduled to see ophthalmology next month.  Blood pressure apparently elevated at nephrology  Past Medical History  Diagnosis Date  . Diabetes mellitus   . Hypertension   . Hyperlipidemia   . Anxiety   . Chronic kidney disease   . Peripheral neuropathy   . Allergy   . Arthritis   . Cataract   . Heart murmur   . Stroke     History   Social History  . Marital Status: Single    Spouse Name: N/A  . Number of Children: N/A  . Years of Education: N/A   Occupational History  . Not on file.   Social History Main Topics  . Smoking status: Never Smoker   . Smokeless tobacco: Never Used  . Alcohol Use: No  . Drug Use: No  . Sexual Activity: Not on file   Other Topics Concern  . Not on file   Social History Narrative    Past Surgical History  Procedure Laterality Date  . Tubal ligation    . Hemorrhoid surgery    . Neck surgery    . Cataract extraction, bilateral    . Appendectomy    . Eye surgery    . Fracture surgery      L foot  . Spine surgery      cervical spine    Family History  Problem Relation Age of Onset  . Cancer Mother   . Hypertension Mother   . Diabetes Mother   . Heart disease Father     Allergies  Allergen Reactions  . Cefaclor Hives  . Tetanus Toxoid Adsorbed Swelling    Current Outpatient Prescriptions on File Prior to Visit  Medication Sig Dispense Refill  . ACCU-CHEK FASTCLIX LANCETS MISC Use daily. 102 each 5  . Alcohol Swabs (B-D SINGLE USE SWABS REGULAR) PADS Use as  directed 100 each 4  . allopurinol (ZYLOPRIM) 100 MG tablet Take 1 tablet (100 mg total) by mouth daily. 90 tablet 1  . amLODipine (NORVASC) 10 MG tablet TAKE 1 TABLET EVERY DAY 90 tablet 1  . aspirin EC 81 MG tablet TAKE 1 TABLET (81 MG TOTAL) BY MOUTH DAILY. SWALLOW WHOLE. 90 tablet 1  . atorvastatin (LIPITOR) 80 MG tablet Take 1 tablet (80 mg total) by mouth daily. 90 tablet 1  . Blood Glucose Monitoring Suppl (ACCU-CHEK NANO SMARTVIEW) W/DEVICE KIT USE AS DIRECTED 1 kit 0  . colchicine (COLCRYS) 0.6 MG tablet Take 1 tablet (0.6 mg total) by mouth daily. 30 tablet 2  . cyclobenzaprine (FLEXERIL) 5 MG tablet Take 1 tablet (5 mg total) by mouth 3 (three) times daily as needed for muscle spasms. 90 tablet 2  . ezetimibe (ZETIA) 10 MG tablet Take 1 tablet (10 mg total) by mouth daily. 90 tablet 1  . glucose blood (ACCU-CHEK SMARTVIEW) test strip USE TO CHECK BLOOD SUGAR BEFORE MEALS AND AT BEDTIME 400 each 3  . HUMALOG KWIKPEN 100 UNIT/ML KiwkPen INJECT 10-16 UNITS INTO THE SKIN 3 (THREE) TIMES   DAILY BEFORE MEALS. 15 pen 2  . insulin glargine (LANTUS) 100 UNIT/ML injection 10 units at bedtime 15 mL 6  . insulin lispro (HUMALOG KWIKPEN) 100 UNIT/ML KiwkPen INJECT 10-16 UNITS INTO THE SKIN 3 (THREE) TIMES DAILY BEFORE MEALS. 15 pen 5  . Insulin Pen Needle (NOVOFINE) 30G X 8 MM MISC Inject 1 packet into the skin as needed.      Marland Kitchen KLOR-CON/EF 25 MEQ disintegrating tablet TAKE ONE TABLET BY MOUTH IN 6 OUNCES OF WATER DAILY 90 tablet 1  . Lancet Devices (PRODIGY LANCING DEVICE) MISC 1 each by Does not apply route once. 1 each 1  . lidocaine (LIDODERM) 5 % Place 1 patch onto the skin daily. APPLY 1 PATCH TO THE AFFECTED AREA AND LEAVE IN PLACE FOR 12 HOURS, THEN REMOVE AND LEAVE OFF FOR 12 HOURS    . LORazepam (ATIVAN) 0.5 MG tablet Take 1 tablet (0.5 mg total) by mouth 2 (two) times daily as needed for anxiety. 180 tablet 1   No current facility-administered medications on file prior to visit.     Pulse 66  Temp(Src) 97.9 F (36.6 C) (Oral)  Resp 20  Ht 5' 3.25" (1.607 m)  Wt 172 lb (78.019 kg)  BMI 30.21 kg/m2  SpO2 99%      Review of Systems  Constitutional: Negative.   HENT: Negative for congestion, dental problem, hearing loss, rhinorrhea, sinus pressure, sore throat and tinnitus.   Eyes: Negative for pain, discharge and visual disturbance.  Respiratory: Negative for cough and shortness of breath.   Cardiovascular: Negative for chest pain, palpitations and leg swelling.  Gastrointestinal: Negative for nausea, vomiting, abdominal pain, diarrhea, constipation, blood in stool and abdominal distention.  Genitourinary: Negative for dysuria, urgency, frequency, hematuria, flank pain, vaginal bleeding, vaginal discharge, difficulty urinating, vaginal pain and pelvic pain.  Musculoskeletal: Negative for joint swelling, arthralgias and gait problem.  Skin: Negative for rash.  Neurological: Negative for dizziness, syncope, speech difficulty, weakness, numbness and headaches.  Hematological: Negative for adenopathy.  Psychiatric/Behavioral: Negative for behavioral problems, dysphoric mood and agitation. The patient is not nervous/anxious.        Objective:   Physical Exam  Constitutional: She is oriented to person, place, and time. She appears well-developed and well-nourished.  HENT:  Head: Normocephalic.  Right Ear: External ear normal.  Left Ear: External ear normal.  Mouth/Throat: Oropharynx is clear and moist.  Eyes: Conjunctivae and EOM are normal. Pupils are equal, round, and reactive to light.  Neck: Normal range of motion. Neck supple. No thyromegaly present.  Cardiovascular: Normal rate, regular rhythm and intact distal pulses.   Murmur heard. Grade 3/6 systolic murmur transmitted to the carotid artery distribution  Pulmonary/Chest: Effort normal and breath sounds normal.  Abdominal: Soft. Bowel sounds are normal. She exhibits no mass. There is no  tenderness.  Musculoskeletal: Normal range of motion.  Lymphadenopathy:    She has no cervical adenopathy.  Neurological: She is alert and oriented to person, place, and time.  Skin: Skin is warm and dry. No rash noted.  Psychiatric: She has a normal mood and affect. Her behavior is normal.          Assessment & Plan:  Hypertension.  Suboptimal control.  Will continue amlodipine and metoprolol Diabetes mellitus.  Will check a hemoglobin A1c Dyslipidemia.  Continue zetia and atorvastatin  Continue home blood pressure monitoring Follow-up nephrology Recheck here 3 months Follow-up ophthalmology

## 2015-04-18 ENCOUNTER — Telehealth: Payer: Self-pay | Admitting: Internal Medicine

## 2015-05-01 ENCOUNTER — Encounter: Payer: Self-pay | Admitting: Internal Medicine

## 2015-05-01 ENCOUNTER — Ambulatory Visit (INDEPENDENT_AMBULATORY_CARE_PROVIDER_SITE_OTHER): Payer: Commercial Managed Care - HMO | Admitting: Internal Medicine

## 2015-05-01 VITALS — BP 140/70 | HR 69 | Temp 98.0°F | Resp 20 | Ht 63.25 in | Wt 175.0 lb

## 2015-05-01 DIAGNOSIS — I10 Essential (primary) hypertension: Secondary | ICD-10-CM

## 2015-05-01 DIAGNOSIS — E0821 Diabetes mellitus due to underlying condition with diabetic nephropathy: Secondary | ICD-10-CM

## 2015-05-01 DIAGNOSIS — I359 Nonrheumatic aortic valve disorder, unspecified: Secondary | ICD-10-CM | POA: Diagnosis not present

## 2015-05-01 DIAGNOSIS — N289 Disorder of kidney and ureter, unspecified: Secondary | ICD-10-CM | POA: Diagnosis not present

## 2015-05-01 LAB — BASIC METABOLIC PANEL
BUN: 21 mg/dL (ref 6–23)
CALCIUM: 9.2 mg/dL (ref 8.4–10.5)
CHLORIDE: 103 meq/L (ref 96–112)
CO2: 28 mEq/L (ref 19–32)
CREATININE: 1.75 mg/dL — AB (ref 0.40–1.20)
GFR: 37.54 mL/min — ABNORMAL LOW (ref >60.00)
GLUCOSE: 128 mg/dL — AB (ref 70–99)
Potassium: 3.3 mEq/L — ABNORMAL LOW (ref 3.5–5.1)
SODIUM: 139 meq/L (ref 135–145)

## 2015-05-01 MED ORDER — FUROSEMIDE 20 MG PO TABS: 20.0000 mg | ORAL_TABLET | Freq: Every day | ORAL | Status: DC

## 2015-05-01 NOTE — Progress Notes (Signed)
Pre visit review using our clinic review tool, if applicable. No additional management support is needed unless otherwise documented below in the visit note. 

## 2015-05-01 NOTE — Patient Instructions (Addendum)
Limit your sodium (Salt) intake  Take all medications as directed  2-D echocardiogram as discussed  Report any worsening or new symptoms  Return in 4 weeks for follow-up  Discontinue metoprolol  Furosemide 20 mg dailyLow-Sodium Eating Plan Sodium raises blood pressure and causes water to be held in the body. Getting less sodium from food will help lower your blood pressure, reduce any swelling, and protect your heart, liver, and kidneys. We get sodium by adding salt (sodium chloride) to food. Most of our sodium comes from canned, boxed, and frozen foods. Restaurant foods, fast foods, and pizza are also very high in sodium. Even if you take medicine to lower your blood pressure or to reduce fluid in your body, getting less sodium from your food is important. WHAT IS MY PLAN? Most people should limit their sodium intake to 2,300 mg a day. Your health care provider recommends that you limit your sodium intake to __________ a day.  WHAT DO I NEED TO KNOW ABOUT THIS EATING PLAN? For the low-sodium eating plan, you will follow these general guidelines:  Choose foods with a % Daily Value for sodium of less than 5% (as listed on the food label).   Use salt-free seasonings or herbs instead of table salt or sea salt.   Check with your health care provider or pharmacist before using salt substitutes.   Eat fresh foods.  Eat more vegetables and fruits.  Limit canned vegetables. If you do use them, rinse them well to decrease the sodium.   Limit cheese to 1 oz (28 g) per day.   Eat lower-sodium products, often labeled as "lower sodium" or "no salt added."  Avoid foods that contain monosodium glutamate (MSG). MSG is sometimes added to [place] food and some canned foods.  Check food labels (Nutrition Facts labels) on foods to learn how much sodium is in one serving.  Eat more home-cooked food and less restaurant, buffet, and fast food.  When eating at a restaurant, ask that your  food be prepared with less salt or none, if possible.  HOW DO I READ FOOD LABELS FOR SODIUM INFORMATION? The Nutrition Facts label lists the amount of sodium in one serving of the food. If you eat more than one serving, you must multiply the listed amount of sodium by the number of servings. Food labels may also identify foods as:  Sodium free--Less than 5 mg in a serving.  Very low sodium--35 mg or less in a serving.  Low sodium--140 mg or less in a serving.  Light in sodium--50% less sodium in a serving. For example, if a food that usually has 300 mg of sodium is changed to become light in sodium, it will have 150 mg of sodium.  Reduced sodium--25% less sodium in a serving. For example, if a food that usually has 400 mg of sodium is changed to reduced sodium, it will have 300 mg of sodium. WHAT FOODS CAN I EAT? Grains Low-sodium cereals, including oats, puffed wheat and rice, and shredded wheat cereals. Low-sodium crackers. Unsalted rice and pasta. Lower-sodium bread.  Vegetables Frozen or fresh vegetables. Low-sodium or reduced-sodium canned vegetables. Low-sodium or reduced-sodium tomato sauce and paste. Low-sodium or reduced-sodium tomato and vegetable juices.  Fruits Fresh, frozen, and canned fruit. Fruit juice.  Meat and Other Protein Products Low-sodium canned tuna and salmon. Fresh or frozen meat, poultry, seafood, and fish. Lamb. Unsalted nuts. Dried beans, peas, and lentils without added salt. Unsalted canned beans. Homemade soups without salt. Eggs.  Dairy Milk. Soy milk. Ricotta cheese. Low-sodium or reduced-sodium cheeses. Yogurt.  Condiments Fresh and dried herbs and spices. Salt-free seasonings. Onion and garlic powders. Low-sodium varieties of mustard and ketchup. Lemon juice.  Fats and Oils Reduced-sodium salad dressings. Unsalted butter.  Other Unsalted popcorn and pretzels.  The items listed above may not be a complete list of recommended foods or  beverages. Contact your dietitian for more options. WHAT FOODS ARE NOT RECOMMENDED? Grains Instant hot cereals. Bread stuffing, pancake, and biscuit mixes. Croutons. Seasoned rice or pasta mixes. Noodle soup cups. Boxed or frozen macaroni and cheese. Self-rising flour. Regular salted crackers. Vegetables Regular canned vegetables. Regular canned tomato sauce and paste. Regular tomato and vegetable juices. Frozen vegetables in sauces. Salted french fries. Olives. Rosita FirePickles. Relishes. Sauerkraut. Salsa. Meat and Other Protein Products Salted, canned, smoked, spiced, or pickled meats, seafood, or fish. Bacon, ham, sausage, hot dogs, corned beef, chipped beef, and packaged luncheon meats. Salt pork. Jerky. Pickled herring. Anchovies, regular canned tuna, and sardines. Salted nuts. Dairy Processed cheese and cheese spreads. Cheese curds. Blue cheese and cottage cheese. Buttermilk.  Condiments Onion and garlic salt, seasoned salt, table salt, and sea salt. Canned and packaged gravies. Worcestershire sauce. Tartar sauce. Barbecue sauce. Teriyaki sauce. Soy sauce, including reduced sodium. Steak sauce. Fish sauce. Oyster sauce. Cocktail sauce. Horseradish. Regular ketchup and mustard. Meat flavorings and tenderizers. Bouillon cubes. Hot sauce. Tabasco sauce. Marinades. Taco seasonings. Relishes. Fats and Oils Regular salad dressings. Salted butter. Margarine. Ghee. Bacon fat.  Other Potato and tortilla chips. Corn chips and puffs. Salted popcorn and pretzels. Canned or dried soups. Pizza. Frozen entrees and pot pies.  The items listed above may not be a complete list of foods and beverages to avoid. Contact your dietitian for more information. Document Released: 05/24/2002 Document Revised: 12/07/2013 Document Reviewed: 10/06/2013 [facility] Patient Information 2015 [employer], [place]. This information is not intended to replace advice given to you by your health care provider. Make sure you discuss any  questions you have with your health care provider.

## 2015-05-01 NOTE — Progress Notes (Signed)
Subjective:    Patient ID: Sarah [name], female    DOB: 09/14/1951, 64 y.o.   MRN: [id number]  HPI 64 year old patient who is seen today with a chief complaint of pedal edema.  She was last seen here 2 weeks ago and placed on metoprolol 50 mg daily for additional blood pressure control.  She complains of worsening pedal edema and has been off medication for 4 days.  There has been no improvement in the lower extremity edema.  No shortness of breath or other symptoms of heart failure She does have a history of hypertension, chronic kidney disease.  Blood pressure regimen does include amlodipine 10 mg daily Patient has a long history of a systolic murmur.  2-D echocardiogram 2003 revealed some mild aortic valve calcium but no stenosis.  LV function normal  BP Readings from Last 3 Encounters:  05/01/15 140/70  04/17/15 150/70  04/05/15 164/94    Past Medical History  Diagnosis Date  . Diabetes mellitus   . Hypertension   . Hyperlipidemia   . Anxiety   . Chronic kidney disease   . Peripheral neuropathy   . Allergy   . Arthritis   . Cataract   . Heart murmur   . Stroke      Review of Systems  Constitutional: Negative.   HENT: Negative for congestion, dental problem, hearing loss, rhinorrhea, sinus pressure, sore throat and tinnitus.   Eyes: Negative for pain, discharge and visual disturbance.  Respiratory: Negative for cough and shortness of breath.   Cardiovascular: Positive for leg swelling. Negative for chest pain and palpitations.  Gastrointestinal: Negative for nausea, vomiting, abdominal pain, diarrhea, constipation, blood in stool and abdominal distention.  Genitourinary: Negative for dysuria, urgency, frequency, hematuria, flank pain, vaginal bleeding, vaginal discharge, difficulty urinating, vaginal pain and pelvic pain.  Musculoskeletal: Positive for joint swelling and gait problem. Negative for arthralgias.       Worsening left knee pain  Skin: Negative for rash.    Neurological: Negative for dizziness, syncope, speech difficulty, weakness, numbness and headaches.  Hematological: Negative for adenopathy.  Psychiatric/Behavioral: Negative for behavioral problems, dysphoric mood and agitation. The patient is not nervous/anxious.        Objective:   Physical Exam  Constitutional: She is oriented to person, place, and time. She appears well-developed and well-nourished.  Walks with a limp due to left knee pain  HENT:  Head: Normocephalic.  Right Ear: External ear normal.  Left Ear: External ear normal.  Mouth/Throat: Oropharynx is clear and moist.  Eyes: Conjunctivae and EOM are normal. Pupils are equal, round, and reactive to light.  Neck: Normal range of motion. Neck supple. No JVD present. No thyromegaly present.  Cardiovascular: Normal rate, regular rhythm and intact distal pulses.   Murmur heard. Grade 3/6 systolic murmur loudest at the base  Pulmonary/Chest: Effort normal and breath sounds normal. No respiratory distress. She has no wheezes. She has no rales.  Abdominal: Soft. Bowel sounds are normal. She exhibits no mass. There is no tenderness.  Musculoskeletal: Normal range of motion. She exhibits edema.  Plus 2 ankle and peel edema  Pain and effusion left knee  Lymphadenopathy:    She has no cervical adenopathy.  Neurological: She is alert and oriented to person, place, and time.  Skin: Skin is warm and dry. No rash noted.  Psychiatric: She has a normal mood and affect. Her behavior is normal.          Assessment & Plan:  Pedal edema.  Doubt this is related to metoprolol but will discontinue.  Will place on furosemide 20.  Blood pressure not poorly controlled today and should improve with volume control.  We'll recheck renal indices Pedal edema probably secondary to amlodipine therapy and chronic kidney disease.  Will intensify.  Low-salt diet Hypertension.  Add furosemide 20 Systolic murmur.  Will check an updated 2-D  echocardiogram to rule out progressive aortic stenosis Left knee pain and swelling.  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  Orthopedic follow-up  Recheck 4 weeks

## 2015-05-05 ENCOUNTER — Telehealth: Payer: Self-pay | Admitting: Internal Medicine

## 2015-05-05 NOTE — Telephone Encounter (Signed)
Spoke to pt, said she got a bill from [facility] for $300.00 and that is said [employer] will not pay. Told pt to call [facility] to see why bill was not paid because [facility]. Pt verbalized understanding.

## 2015-05-05 NOTE — Telephone Encounter (Signed)
Pt would like you to call her about going to the kidney dr. Pt received a bill for $300 and will not go back.

## 2015-05-08 ENCOUNTER — Telehealth: Payer: Self-pay | Admitting: Family Medicine

## 2015-05-08 ENCOUNTER — Other Ambulatory Visit: Payer: Self-pay | Admitting: Family Medicine

## 2015-05-08 DIAGNOSIS — Z1239 Encounter for other screening for malignant neoplasm of breast: Secondary | ICD-10-CM

## 2015-05-08 NOTE — Telephone Encounter (Signed)
Spoke to the pt.  She has not had a recent mammogram.  Order placed in the system. 

## 2015-05-17 ENCOUNTER — Other Ambulatory Visit: Payer: Self-pay

## 2015-05-17 ENCOUNTER — Ambulatory Visit (HOSPITAL_COMMUNITY): Payer: Commercial Managed Care - HMO | Attending: Internal Medicine

## 2015-05-17 DIAGNOSIS — I359 Nonrheumatic aortic valve disorder, unspecified: Secondary | ICD-10-CM | POA: Diagnosis not present

## 2015-05-17 DIAGNOSIS — I350 Nonrheumatic aortic (valve) stenosis: Secondary | ICD-10-CM | POA: Insufficient documentation

## 2015-05-17 DIAGNOSIS — I059 Rheumatic mitral valve disease, unspecified: Secondary | ICD-10-CM | POA: Diagnosis not present

## 2015-05-17 DIAGNOSIS — I351 Nonrheumatic aortic (valve) insufficiency: Secondary | ICD-10-CM | POA: Diagnosis not present

## 2015-05-29 ENCOUNTER — Ambulatory Visit: Payer: Commercial Managed Care - HMO | Admitting: Internal Medicine

## 2015-05-29 DIAGNOSIS — E11359 Type 2 diabetes mellitus with proliferative diabetic retinopathy without macular edema: Secondary | ICD-10-CM | POA: Diagnosis not present

## 2015-05-30 ENCOUNTER — Encounter: Payer: Self-pay | Admitting: Internal Medicine

## 2015-05-30 ENCOUNTER — Ambulatory Visit (INDEPENDENT_AMBULATORY_CARE_PROVIDER_SITE_OTHER): Payer: Commercial Managed Care - HMO | Admitting: Internal Medicine

## 2015-05-30 VITALS — BP 130/80 | HR 61 | Temp 97.9°F | Resp 20 | Ht 63.25 in | Wt 172.0 lb

## 2015-05-30 DIAGNOSIS — I10 Essential (primary) hypertension: Secondary | ICD-10-CM | POA: Diagnosis not present

## 2015-05-30 DIAGNOSIS — E0821 Diabetes mellitus due to underlying condition with diabetic nephropathy: Secondary | ICD-10-CM

## 2015-05-30 DIAGNOSIS — E7800 Pure hypercholesterolemia, unspecified: Secondary | ICD-10-CM

## 2015-05-30 MED ORDER — HYDROCODONE-ACETAMINOPHEN 10-325 MG PO TABS: 0.5000 | ORAL_TABLET | Freq: Three times a day (TID) | ORAL | Status: DC | PRN

## 2015-05-30 NOTE — Progress Notes (Signed)
Pre visit review using our clinic review tool, if applicable. No additional management support is needed unless otherwise documented below in the visit note. 

## 2015-05-30 NOTE — Progress Notes (Signed)
Subjective:    Patient ID: Sarah [name], female    DOB: 1951/01/08, 64 y.o.   MRN: [id number]  HPI  64 year old patient who is seen today for follow-up of diabetes.  She has secondary chronic kidney disease.  She also has a history of essential hypertension as well as dyslipidemia.  Remains on statin therapy.  She feels well today.  Lab Results  Component Value Date   HGBA1C 7.5* 04/17/2015    Last hemoglobin A1c 1 month ago was not well controlled.  She states that she has not been using Lantus for some time due to cost considerations.  She is using mealtime insulin 3 times daily, but is often decrease in the dose when blood sugars are near normal.  No documented hypoglycemia.  Denies any cardiopulmonary complaints She has seen ophthalmology recently and was given a 9 month interval prior to her next retinal exam  Past Medical History  Diagnosis Date  . Diabetes mellitus   . Hypertension   . Hyperlipidemia   . Anxiety   . Chronic kidney disease   . Peripheral neuropathy   . Allergy   . Arthritis   . Cataract   . Heart murmur   . Stroke     History   Social History  . Marital Status: Single    Spouse Name: N/A  . Number of Children: N/A  . Years of Education: N/A   Occupational History  . Not on file.   Social History Main Topics  . Smoking status: Never Smoker   . Smokeless tobacco: Never Used  . Alcohol Use: No  . Drug Use: No  . Sexual Activity: Not on file   Other Topics Concern  . Not on file   Social History Narrative    Past Surgical History  Procedure Laterality Date  . Tubal ligation    . Hemorrhoid surgery    . Neck surgery    . Cataract extraction, bilateral    . Appendectomy    . Eye surgery    . Fracture surgery      L foot  . Spine surgery      cervical spine    Family History  Problem Relation Age of Onset  . Cancer Mother   . Hypertension Mother   . Diabetes Mother   . Heart disease Father     Allergies  Allergen  Reactions  . Metoprolol Swelling  . Cefaclor Hives  . Tetanus Toxoid Adsorbed Swelling    Current Outpatient Prescriptions on File Prior to Visit  Medication Sig Dispense Refill  . ACCU-CHEK FASTCLIX LANCETS MISC Use daily. 102 each 5  . Alcohol Swabs (B-D SINGLE USE SWABS REGULAR) PADS Use as directed 100 each 4  . allopurinol (ZYLOPRIM) 100 MG tablet Take 1 tablet (100 mg total) by mouth daily. 90 tablet 1  . amLODipine (NORVASC) 10 MG tablet TAKE 1 TABLET EVERY DAY 90 tablet 1  . aspirin EC 81 MG tablet TAKE 1 TABLET (81 MG TOTAL) BY MOUTH DAILY. SWALLOW WHOLE. 90 tablet 1  . atorvastatin (LIPITOR) 80 MG tablet Take 1 tablet (80 mg total) by mouth daily. 90 tablet 1  . Blood Glucose Monitoring Suppl (ACCU-CHEK NANO SMARTVIEW) W/DEVICE KIT USE AS DIRECTED 1 kit 0  . colchicine (COLCRYS) 0.6 MG tablet Take 1 tablet (0.6 mg total) by mouth daily. 30 tablet 2  . cyclobenzaprine (FLEXERIL) 5 MG tablet Take 1 tablet (5 mg total) by mouth 3 (three) times daily as needed for  muscle spasms. 90 tablet 2  . ezetimibe (ZETIA) 10 MG tablet Take 1 tablet (10 mg total) by mouth daily. 90 tablet 1  . furosemide (LASIX) 20 MG tablet Take 1 tablet (20 mg total) by mouth daily. 30 tablet 3  . glucose blood (ACCU-CHEK SMARTVIEW) test strip USE TO CHECK BLOOD SUGAR BEFORE MEALS AND AT BEDTIME 400 each 3  . HUMALOG KWIKPEN 100 UNIT/ML KiwkPen INJECT 10-16 UNITS INTO THE SKIN 3 (THREE) TIMES DAILY BEFORE MEALS. 15 pen 2  . insulin glargine (LANTUS) 100 UNIT/ML injection 10 units at bedtime 15 mL 6  . insulin lispro (HUMALOG KWIKPEN) 100 UNIT/ML KiwkPen INJECT 10-16 UNITS INTO THE SKIN 3 (THREE) TIMES DAILY BEFORE MEALS. 15 pen 5  . Insulin Pen Needle (NOVOFINE) 30G X 8 MM MISC Inject 1 packet into the skin as needed.      Marland Kitchen KLOR-CON/EF 25 MEQ disintegrating tablet TAKE ONE TABLET BY MOUTH IN 6 OUNCES OF WATER DAILY 90 tablet 1  . Lancet Devices (PRODIGY LANCING DEVICE) MISC 1 each by Does not apply route once.  1 each 1  . lidocaine (LIDODERM) 5 % Place 1 patch onto the skin daily. APPLY 1 PATCH TO THE AFFECTED AREA AND LEAVE IN PLACE FOR 12 HOURS, THEN REMOVE AND LEAVE OFF FOR 12 HOURS    . LORazepam (ATIVAN) 0.5 MG tablet Take 1 tablet (0.5 mg total) by mouth 2 (two) times daily as needed for anxiety. 180 tablet 1   No current facility-administered medications on file prior to visit.    BP 130/80 mmHg  Pulse 61  Temp(Src) 97.9 F (36.6 C) (Oral)  Resp 20  Ht 5' 3.25" (1.607 m)  Wt 172 lb (78.019 kg)  BMI 30.21 kg/m2  SpO2 99%      Review of Systems  Constitutional: Negative.   HENT: Negative for congestion, dental problem, hearing loss, rhinorrhea, sinus pressure, sore throat and tinnitus.   Eyes: Negative for pain, discharge and visual disturbance.  Respiratory: Negative for cough and shortness of breath.   Cardiovascular: Negative for chest pain, palpitations and leg swelling.  Gastrointestinal: Negative for nausea, vomiting, abdominal pain, diarrhea, constipation, blood in stool and abdominal distention.  Genitourinary: Negative for dysuria, urgency, frequency, hematuria, flank pain, vaginal bleeding, vaginal discharge, difficulty urinating, vaginal pain and pelvic pain.  Musculoskeletal: Negative for joint swelling, arthralgias and gait problem.  Skin: Negative for rash.  Neurological: Negative for dizziness, syncope, speech difficulty, weakness, numbness and headaches.  Hematological: Negative for adenopathy.  Psychiatric/Behavioral: Negative for behavioral problems, dysphoric mood and agitation. The patient is not nervous/anxious.        Objective:   Physical Exam  Constitutional: She is oriented to person, place, and time. She appears well-developed and well-nourished.  HENT:  Head: Normocephalic.  Right Ear: External ear normal.  Left Ear: External ear normal.  Mouth/Throat: Oropharynx is clear and moist.  Eyes: Conjunctivae and EOM are normal. Pupils are equal, round,  and reactive to light.  Neck: Normal range of motion. Neck supple. No thyromegaly present.  Cardiovascular: Normal rate, regular rhythm and intact distal pulses.   Murmur heard. Pulmonary/Chest: Effort normal and breath sounds normal.  Abdominal: Soft. Bowel sounds are normal. She exhibits no mass. There is no tenderness.  Musculoskeletal: Normal range of motion.  Lymphadenopathy:    She has no cervical adenopathy.  Neurological: She is alert and oriented to person, place, and time.  Skin: Skin is warm and dry. No rash noted.  Psychiatric: She has a  normal mood and affect. Her behavior is normal.          Assessment & Plan:   Diabetes mellitus.  Suboptimal control.  Will intensify mealtime insulin attempt to control diabetes with short acting insulin only.  Will recheck hemoglobin A1c in 2 months and resume basal insulin if not under optimal control Hypertension, stable Dyslipidemia.  Continue statin therapy Osteoarthritis with left knee pain  Recheck 2-3 months

## 2015-05-30 NOTE — Patient Instructions (Signed)
Please check your hemoglobin A1c every 3 months    It is important that you exercise regularly, at least 20 minutes 3 to 4 times per week.  If you develop chest pain or shortness of breath seek  medical attention.  Return in 3 months for follow-up    

## 2015-07-03 ENCOUNTER — Telehealth: Payer: Self-pay | Admitting: Internal Medicine

## 2015-07-03 MED ORDER — HYDROCODONE-ACETAMINOPHEN 10-325 MG PO TABS: 0.5000 | ORAL_TABLET | Freq: Three times a day (TID) | ORAL | Status: DC | PRN

## 2015-07-03 NOTE — Telephone Encounter (Signed)
Pt request refill of the following: HYDROcodone-acetaminophen (NORCO) 10-325 MG per tablet ° ° °Phamacy: ° °

## 2015-07-03 NOTE — Telephone Encounter (Signed)
Pt notified Rx ready for pickup. Rx printed and signed.  

## 2015-07-18 ENCOUNTER — Ambulatory Visit: Payer: Commercial Managed Care - HMO | Admitting: Internal Medicine

## 2015-08-10 ENCOUNTER — Telehealth: Payer: Self-pay | Admitting: Internal Medicine

## 2015-08-10 MED ORDER — HYDROCODONE-ACETAMINOPHEN 10-325 MG PO TABS: 0.5000 | ORAL_TABLET | Freq: Three times a day (TID) | ORAL | Status: DC | PRN

## 2015-08-10 NOTE — Telephone Encounter (Signed)
Pt notified Rx ready for pickup. Rx printed and signed.  

## 2015-08-10 NOTE — Telephone Encounter (Signed)
Pt needs new rx hydrocodone °

## 2015-08-11 ENCOUNTER — Other Ambulatory Visit: Payer: Self-pay | Admitting: Internal Medicine

## 2015-08-14 ENCOUNTER — Other Ambulatory Visit: Payer: Self-pay | Admitting: Internal Medicine

## 2015-08-16 ENCOUNTER — Other Ambulatory Visit: Payer: Self-pay | Admitting: Internal Medicine

## 2015-08-22 ENCOUNTER — Other Ambulatory Visit: Payer: Self-pay | Admitting: Internal Medicine

## 2015-08-30 ENCOUNTER — Ambulatory Visit (INDEPENDENT_AMBULATORY_CARE_PROVIDER_SITE_OTHER): Payer: Commercial Managed Care - HMO | Admitting: Internal Medicine

## 2015-08-30 ENCOUNTER — Encounter: Payer: Self-pay | Admitting: Internal Medicine

## 2015-08-30 VITALS — BP 140/80 | HR 62 | Temp 98.0°F | Resp 20 | Ht 63.25 in | Wt 176.0 lb

## 2015-08-30 DIAGNOSIS — Z23 Encounter for immunization: Secondary | ICD-10-CM | POA: Diagnosis not present

## 2015-08-30 DIAGNOSIS — Z8679 Personal history of other diseases of the circulatory system: Secondary | ICD-10-CM

## 2015-08-30 DIAGNOSIS — N189 Chronic kidney disease, unspecified: Secondary | ICD-10-CM | POA: Diagnosis not present

## 2015-08-30 DIAGNOSIS — I10 Essential (primary) hypertension: Secondary | ICD-10-CM | POA: Diagnosis not present

## 2015-08-30 DIAGNOSIS — E0822 Diabetes mellitus due to underlying condition with diabetic chronic kidney disease: Secondary | ICD-10-CM

## 2015-08-30 LAB — HEMOGLOBIN A1C: Hgb A1c MFr Bld: 7.8 % — ABNORMAL HIGH (ref 4.6–6.5)

## 2015-08-30 NOTE — Patient Instructions (Signed)
Limit your sodium (Salt) intake   Please check your hemoglobin A1c every 3 months  Return in 3 months for follow-up   

## 2015-08-30 NOTE — Progress Notes (Signed)
Pre visit review using our clinic review tool, if applicable. No additional management support is needed unless otherwise documented below in the visit note. 

## 2015-08-30 NOTE — Progress Notes (Signed)
Subjective:    Patient ID: Sarah [name], female    DOB: 1951-01-10, 64 y.o.   MRN: [id number]  HPI  Lab Results  Component Value Date   HGBA1C 7.5* 04/17/2015   64 year old patient who is seen today for follow-up of diabetes.  Last hemoglobin A1c was 7.5 She has discontinued Lantus due to nocturnal hypoglycemia.  She remains on Humalog 3 times daily prior to meals and takes 10-16 units.  However, she states that she omits insulin therapy when her blood sugar is fairly normal She has one or 2 hypoglycemic episodes per month.  No further nocturnal hypoglycemia since Lantus on hold  She has treated hypertension.  Since her last visit here, she had an episode of gout following eating at a steakhouse.  She has a history of single vascular disease and knee osteoarthritis.  She has dyslipidemia and hypertension  Past Medical History  Diagnosis Date  . Diabetes mellitus   . Hypertension   . Hyperlipidemia   . Anxiety   . Chronic kidney disease   . Peripheral neuropathy   . Allergy   . Arthritis   . Cataract   . Heart murmur   . Stroke     Social History   Social History  . Marital Status: Single    Spouse Name: N/A  . Number of Children: N/A  . Years of Education: N/A   Occupational History  . Not on file.   Social History Main Topics  . Smoking status: Never Smoker   . Smokeless tobacco: Never Used  . Alcohol Use: No  . Drug Use: No  . Sexual Activity: Not on file   Other Topics Concern  . Not on file   Social History Narrative    Past Surgical History  Procedure Laterality Date  . Tubal ligation    . Hemorrhoid surgery    . Neck surgery    . Cataract extraction, bilateral    . Appendectomy    . Eye surgery    . Fracture surgery      L foot  . Spine surgery      cervical spine    Family History  Problem Relation Age of Onset  . Cancer Mother   . Hypertension Mother   . Diabetes Mother   . Heart disease Father     Allergies  Allergen Reactions    . Metoprolol Swelling  . Cefaclor Hives  . Tetanus Toxoid Adsorbed Swelling    Current Outpatient Prescriptions on File Prior to Visit  Medication Sig Dispense Refill  . ACCU-CHEK FASTCLIX LANCETS MISC Use daily. 102 each 5  . Alcohol Swabs (B-D SINGLE USE SWABS REGULAR) PADS Use as directed 100 each 4  . allopurinol (ZYLOPRIM) 100 MG tablet Take 1 tablet (100 mg total) by mouth daily. 90 tablet 1  . amLODipine (NORVASC) 10 MG tablet TAKE 1 TABLET EVERY DAY 90 tablet 3  . aspirin EC 81 MG tablet TAKE 1 TABLET (81 MG TOTAL) BY MOUTH DAILY. SWALLOW WHOLE. 90 tablet 1  . atorvastatin (LIPITOR) 80 MG tablet TAKE 1 TABLET (80 MG TOTAL) BY MOUTH DAILY. 90 tablet 1  . Blood Glucose Monitoring Suppl (ACCU-CHEK NANO SMARTVIEW) W/DEVICE KIT USE AS DIRECTED 1 kit 0  . colchicine 0.6 MG tablet TAKE 1 TABLET BY MOUTH DAILY. 30 tablet 0  . cyclobenzaprine (FLEXERIL) 5 MG tablet Take 1 tablet (5 mg total) by mouth 3 (three) times daily as needed for muscle spasms. 90 tablet 2  .  ezetimibe (ZETIA) 10 MG tablet Take 1 tablet (10 mg total) by mouth daily. 90 tablet 1  . furosemide (LASIX) 20 MG tablet TAKE 1 TABLET (20 MG TOTAL) BY MOUTH DAILY. 30 tablet 5  . glucose blood (ACCU-CHEK SMARTVIEW) test strip USE TO CHECK BLOOD SUGAR BEFORE MEALS AND AT BEDTIME 400 each 3  . HUMALOG KWIKPEN 100 UNIT/ML KiwkPen INJECT 10-16 UNITS INTO THE SKIN 3 (THREE) TIMES DAILY BEFORE MEALS. 15 pen 2  . HYDROcodone-acetaminophen (NORCO) 10-325 MG per tablet Take 0.5-1 tablets by mouth every 8 (eight) hours as needed. 90 tablet 0  . insulin glargine (LANTUS) 100 UNIT/ML injection 10 units at bedtime 15 mL 6  . insulin lispro (HUMALOG KWIKPEN) 100 UNIT/ML KiwkPen INJECT 10-16 UNITS INTO THE SKIN 3 (THREE) TIMES DAILY BEFORE MEALS. 15 pen 5  . Insulin Pen Needle (NOVOFINE) 30G X 8 MM MISC Inject 1 packet into the skin as needed.      Marland Kitchen KLOR-CON/EF 25 MEQ disintegrating tablet TAKE ONE TABLET BY MOUTH IN 6 OUNCES OF WATER DAILY  90 tablet 1  . Lancet Devices (PRODIGY LANCING DEVICE) MISC 1 each by Does not apply route once. 1 each 1  . lidocaine (LIDODERM) 5 % Place 1 patch onto the skin daily. APPLY 1 PATCH TO THE AFFECTED AREA AND LEAVE IN PLACE FOR 12 HOURS, THEN REMOVE AND LEAVE OFF FOR 12 HOURS    . LORazepam (ATIVAN) 0.5 MG tablet Take 1 tablet (0.5 mg total) by mouth 2 (two) times daily as needed for anxiety. 180 tablet 1   No current facility-administered medications on file prior to visit.    BP 140/80 mmHg  Pulse 62  Temp(Src) 98 F (36.7 C) (Oral)  Resp 20  Ht 5' 3.25" (1.607 m)  Wt 176 lb (79.833 kg)  BMI 30.91 kg/m2  SpO2 98%    Review of Systems  Constitutional: Negative.   HENT: Negative for congestion, dental problem, hearing loss, rhinorrhea, sinus pressure, sore throat and tinnitus.   Eyes: Negative for pain, discharge and visual disturbance.  Respiratory: Negative for cough and shortness of breath.   Cardiovascular: Negative for chest pain, palpitations and leg swelling.  Gastrointestinal: Negative for nausea, vomiting, abdominal pain, diarrhea, constipation, blood in stool and abdominal distention.  Genitourinary: Negative for dysuria, urgency, frequency, hematuria, flank pain, vaginal bleeding, vaginal discharge, difficulty urinating, vaginal pain and pelvic pain.  Musculoskeletal: Positive for arthralgias and gait problem. Negative for joint swelling.  Skin: Negative for rash.  Neurological: Negative for dizziness, syncope, speech difficulty, weakness, numbness and headaches.  Hematological: Negative for adenopathy.  Psychiatric/Behavioral: Negative for behavioral problems, dysphoric mood and agitation. The patient is not nervous/anxious.        Objective:   Physical Exam  Constitutional: She is oriented to person, place, and time. She appears well-developed and well-nourished.  Blood pressure 128/80  HENT:  Head: Normocephalic.  Right Ear: External ear normal.  Left Ear:  External ear normal.  Mouth/Throat: Oropharynx is clear and moist.  Eyes: Conjunctivae and EOM are normal. Pupils are equal, round, and reactive to light.  Neck: Normal range of motion. Neck supple. No thyromegaly present.  Cardiovascular: Normal rate, regular rhythm, normal heart sounds and intact distal pulses.   Pulmonary/Chest: Effort normal and breath sounds normal.  Abdominal: Soft. Bowel sounds are normal. She exhibits no mass. There is no tenderness.  Musculoskeletal: Normal range of motion.  Lymphadenopathy:    She has no cervical adenopathy.  Neurological: She is alert and oriented  to person, place, and time.  Skin: Skin is warm and dry. No rash noted. diabetes mellitus. Psychiatric: She has a normal mood and affect. Her behavior is normal.          Assessment & Plan:    Diabetes mellitus.  Will check a hemoglobin A1c.  We'll continue to hold basal insulin at this time Hypertension, stable Dyslipidemia.  Continue statin therapy Cerebrovascular disease  CPX 3 months

## 2015-09-18 ENCOUNTER — Telehealth: Payer: Self-pay | Admitting: Internal Medicine

## 2015-09-18 NOTE — Telephone Encounter (Signed)
Pt request refill of the following: HYDROcodone-acetaminophen (NORCO) 10-325 MG per tablet ° ° °Phamacy: ° °

## 2015-09-19 MED ORDER — HYDROCODONE-ACETAMINOPHEN 10-325 MG PO TABS: 0.5000 | ORAL_TABLET | Freq: Three times a day (TID) | ORAL | Status: DC | PRN

## 2015-09-19 NOTE — Telephone Encounter (Signed)
Pt notified Rx ready for pickup. Rx printed and signed.  

## 2015-10-04 ENCOUNTER — Ambulatory Visit (INDEPENDENT_AMBULATORY_CARE_PROVIDER_SITE_OTHER): Payer: Commercial Managed Care - HMO | Admitting: Family Medicine

## 2015-10-04 VITALS — BP 160/80 | HR 66 | Temp 97.6°F | Wt 178.2 lb

## 2015-10-04 DIAGNOSIS — R21 Rash and other nonspecific skin eruption: Secondary | ICD-10-CM | POA: Diagnosis not present

## 2015-10-04 MED ORDER — TRIAMCINOLONE ACETONIDE 0.1 % EX CREA: 1.0000 "application " | TOPICAL_CREAM | Freq: Two times a day (BID) | CUTANEOUS | Status: DC

## 2015-10-04 NOTE — Progress Notes (Signed)
   Subjective:    Patient ID: Sarah [name], female    DOB: 08/14/1951, 64 y.o.   MRN: [id number]  HPI Patient seen for evaluation of skin rash. Onset about a week ago. She has involvement of the left forearm and left knee. She was concerned this may be shingles as she had this once in the past. She has more itching and only minimal burning. She has had no progression of rash since onset. Moderate pruritus. She's not tried anything topically. No fevers or any other concerns. She has 1 indoor pet-dog that never goes outside.    Past Medical History  Diagnosis Date  . Diabetes mellitus   . Hypertension   . Hyperlipidemia   . Anxiety   . Chronic kidney disease   . Peripheral neuropathy   . Allergy   . Arthritis   . Cataract   . Heart murmur   . Stroke    Past Surgical History  Procedure Laterality Date  . Tubal ligation    . Hemorrhoid surgery    . Neck surgery    . Cataract extraction, bilateral    . Appendectomy    . Eye surgery    . Fracture surgery      L foot  . Spine surgery      cervical spine    reports that she has never smoked. She has never used smokeless tobacco. She reports that she does not drink alcohol or use illicit drugs. family history includes Cancer in her mother; Diabetes in her mother; Heart disease in her father; Hypertension in her mother. Allergies  Allergen Reactions  . Metoprolol Swelling  . Cefaclor Hives  . Tetanus Toxoid Adsorbed Swelling      Review of Systems  Constitutional: Negative for fever and chills.  Skin: Positive for rash.       Objective:   Physical Exam  Constitutional: She appears well-developed and well-nourished.  Cardiovascular: Normal rate and regular rhythm.   Pulmonary/Chest: Effort normal and breath sounds normal. No respiratory distress. She has no wheezes. She has no rales.  Skin: Rash noted.  She has a couple of small 2 mm papules dorsum left forearm distally over the wrist and very similar skin lesion left  anterior knee. No pustules. Nontender.          Assessment & Plan:  Skin rash. No evidence for shingles. Differential is some type of bite versus possible mild contact dermatitis. Triamcinolone 0.1% cream twice daily as needed for itching. Follow-up as needed

## 2015-10-04 NOTE — Progress Notes (Signed)
Pre visit review using our clinic review tool, if applicable. No additional management support is needed unless otherwise documented below in the visit note. 

## 2015-10-23 ENCOUNTER — Telehealth: Payer: Self-pay | Admitting: Internal Medicine

## 2015-10-23 MED ORDER — HYDROCODONE-ACETAMINOPHEN 10-325 MG PO TABS: 0.5000 | ORAL_TABLET | Freq: Three times a day (TID) | ORAL | Status: DC | PRN

## 2015-10-23 NOTE — Telephone Encounter (Signed)
Patient needs his hydrocodone prescription refilled.

## 2015-10-23 NOTE — Telephone Encounter (Signed)
Tried to contact pt phone not working will try again later. Rx printed and signed by Dr. Caryl NeverBurchette.

## 2015-10-24 NOTE — Telephone Encounter (Signed)
Pt notified Rx ready for pickup. Rx printed and signed by Dr. Burchette.  

## 2015-10-30 ENCOUNTER — Other Ambulatory Visit: Payer: Self-pay | Admitting: Internal Medicine

## 2015-11-29 ENCOUNTER — Ambulatory Visit (INDEPENDENT_AMBULATORY_CARE_PROVIDER_SITE_OTHER): Payer: Commercial Managed Care - HMO | Admitting: Internal Medicine

## 2015-11-29 ENCOUNTER — Encounter: Payer: Self-pay | Admitting: Internal Medicine

## 2015-11-29 VITALS — BP 158/90 | HR 73 | Temp 97.8°F | Ht 63.25 in | Wt 178.0 lb

## 2015-11-29 DIAGNOSIS — E0821 Diabetes mellitus due to underlying condition with diabetic nephropathy: Secondary | ICD-10-CM

## 2015-11-29 DIAGNOSIS — E7800 Pure hypercholesterolemia, unspecified: Secondary | ICD-10-CM | POA: Diagnosis not present

## 2015-11-29 DIAGNOSIS — I10 Essential (primary) hypertension: Secondary | ICD-10-CM | POA: Diagnosis not present

## 2015-11-29 DIAGNOSIS — M1000 Idiopathic gout, unspecified site: Secondary | ICD-10-CM

## 2015-11-29 DIAGNOSIS — N289 Disorder of kidney and ureter, unspecified: Secondary | ICD-10-CM

## 2015-11-29 LAB — BASIC METABOLIC PANEL
BUN: 19 mg/dL (ref 6–23)
CALCIUM: 9.4 mg/dL (ref 8.4–10.5)
CO2: 25 mEq/L (ref 19–32)
Chloride: 104 mEq/L (ref 96–112)
Creatinine, Ser: 2.06 mg/dL — ABNORMAL HIGH (ref 0.40–1.20)
GFR: 31.05 mL/min — AB (ref >60.00)
GLUCOSE: 185 mg/dL — AB (ref 70–99)
Potassium: 4.1 mEq/L (ref 3.5–5.1)
SODIUM: 140 meq/L (ref 135–145)

## 2015-11-29 LAB — HEMOGLOBIN A1C: HEMOGLOBIN A1C: 8.3 % — AB (ref 4.6–6.5)

## 2015-11-29 LAB — URIC ACID: URIC ACID, SERUM: 9.2 mg/dL — AB (ref 2.4–7.0)

## 2015-11-29 MED ORDER — HYDROCODONE-ACETAMINOPHEN 10-325 MG PO TABS: 0.5000 | ORAL_TABLET | Freq: Three times a day (TID) | ORAL | Status: DC | PRN

## 2015-11-29 MED ORDER — COLCHICINE 0.6 MG PO TABS: 0.6000 mg | ORAL_TABLET | Freq: Two times a day (BID) | ORAL | Status: DC

## 2015-11-29 MED ORDER — INSULIN ASPART 100 UNIT/ML ~~LOC~~ SOLN: 15.0000 [IU] | Freq: Three times a day (TID) | SUBCUTANEOUS | Status: DC

## 2015-11-29 NOTE — Patient Instructions (Addendum)
Increased colchicine to 3 times daily with meals  Take analgesic as prescribed  Report any new or worsening symptoms  Return in one month for follow-upGout Gout is when your joints become red, sore, and swell (inflamed). This is caused by the buildup of uric acid crystals in the joints. Uric acid is a chemical that is normally in the blood. If the level of uric acid gets too high in the blood, these crystals form in your joints and tissues. Over time, these crystals can form into masses near the joints and tissues. These masses can destroy bone and cause the bone to look misshapen (deformed). HOME CARE   Do not take aspirin for pain.  Only take medicine as told by your doctor.  Rest the joint as much as you can. When in bed, keep sheets and blankets off painful areas.  Keep the sore joints raised (elevated).  Put warm or cold packs on painful joints. Use of warm or cold packs depends on which works best for you.  Use crutches if the painful joint is in your leg.  Drink enough fluids to keep your pee (urine) clear or pale yellow. Limit alcohol, sugary drinks, and drinks with fructose in them.  Follow your diet instructions. Pay careful attention to how much protein you eat. Include fruits, vegetables, whole grains, and fat-free or low-fat milk products in your daily diet. Talk to your doctor or dietitian about the use of coffee, vitamin C, and cherries. These may help lower uric acid levels.  Keep a healthy body weight. GET HELP RIGHT AWAY IF:   You have watery poop (diarrhea), throw up (vomit), or have any side effects from medicines.  You do not feel better in 24 hours, or you are getting worse.  Your joint becomes suddenly more tender, and you have chills or a fever. MAKE SURE YOU:   Understand these instructions.  Will watch your condition.  Will get help right away if you are not doing well or get worse.   This information is not intended to replace advice given to you  by your health care provider. Make sure you discuss any questions you have with your health care provider.   Document Released: 09/10/2008 Document Revised: 12/23/2014 Document Reviewed: 07/15/2012 Elsevier Interactive Patient Education [employer]2016 Elsevier Inc.

## 2015-11-29 NOTE — Progress Notes (Signed)
Subjective:    Patient ID: Sarah [name], female    DOB: 1951-05-29, 64 y.o.   MRN: [id number]  HPI  64 year old patient who has a history of gout who presents with a four-day history of pain and swelling involving the left elbow.  No history of trauma.  She has been using Colchicine once daily.  She has no analgesics She has diabetes.  Lab Results  Component Value Date   HGBA1C 7.8* 08/30/2015   At the present time.  She is on mealtime insulin only 15 units 3 times daily.  She states fasting blood sugars range from 100-160 and blood sugars before lunch and dinner 80- 90.  She states blood sugars are occasionally in the sixties when she skips a meal  No blood pressure medications today  Past Medical History  Diagnosis Date  . Diabetes mellitus   . Hypertension   . Hyperlipidemia   . Anxiety   . Chronic kidney disease   . Peripheral neuropathy ([place])   . Allergy   . Arthritis   . Cataract   . Heart murmur   . Stroke [facility])     Social History   Social History  . Marital Status: Single    Spouse Name: N/A  . Number of Children: N/A  . Years of Education: N/A   Occupational History  . Not on file.   Social History Main Topics  . Smoking status: Never Smoker   . Smokeless tobacco: Never Used  . Alcohol Use: No  . Drug Use: No  . Sexual Activity: Not on file   Other Topics Concern  . Not on file   Social History Narrative    Past Surgical History  Procedure Laterality Date  . Tubal ligation    . Hemorrhoid surgery    . Neck surgery    . Cataract extraction, bilateral    . Appendectomy    . Eye surgery    . Fracture surgery      L foot  . Spine surgery      cervical spine    Family History  Problem Relation Age of Onset  . Cancer Mother   . Hypertension Mother   . Diabetes Mother   . Heart disease Father     Allergies  Allergen Reactions  . Metoprolol Swelling  . Cefaclor Hives  . Tetanus Toxoid Adsorbed Swelling    Current Outpatient  Prescriptions on File Prior to Visit  Medication Sig Dispense Refill  . ACCU-CHEK FASTCLIX LANCETS MISC Use daily. 102 each 5  . Alcohol Swabs (B-D SINGLE USE SWABS REGULAR) PADS Use as directed 100 each 4  . allopurinol (ZYLOPRIM) 100 MG tablet Take 1 tablet (100 mg total) by mouth daily. 90 tablet 1  . amLODipine (NORVASC) 10 MG tablet TAKE 1 TABLET EVERY DAY 90 tablet 3  . aspirin 81 MG EC tablet TAKE 1 TABLET BY MOUTH DAILY. SWALLOW WHOLE. 90 tablet 3  . atorvastatin (LIPITOR) 80 MG tablet TAKE 1 TABLET (80 MG TOTAL) BY MOUTH DAILY. 90 tablet 1  . Blood Glucose Monitoring Suppl (ACCU-CHEK NANO SMARTVIEW) W/DEVICE KIT USE AS DIRECTED 1 kit 0  . colchicine 0.6 MG tablet TAKE 1 TABLET BY MOUTH DAILY. 30 tablet 5  . cyclobenzaprine (FLEXERIL) 5 MG tablet Take 1 tablet (5 mg total) by mouth 3 (three) times daily as needed for muscle spasms. 90 tablet 2  . ezetimibe (ZETIA) 10 MG tablet Take 1 tablet (10 mg total) by mouth daily. 90 tablet  1  . furosemide (LASIX) 20 MG tablet TAKE 1 TABLET (20 MG TOTAL) BY MOUTH DAILY. 30 tablet 5  . glucose blood (ACCU-CHEK SMARTVIEW) test strip USE TO CHECK BLOOD SUGAR BEFORE MEALS AND AT BEDTIME 400 each 3  . HUMALOG KWIKPEN 100 UNIT/ML KiwkPen INJECT 10-16 UNITS INTO THE SKIN 3 (THREE) TIMES DAILY BEFORE MEALS. 15 pen 2  . HYDROcodone-acetaminophen (NORCO) 10-325 MG tablet Take 0.5-1 tablets by mouth every 8 (eight) hours as needed. 90 tablet 0  . insulin glargine (LANTUS) 100 UNIT/ML injection 10 units at bedtime 15 mL 6  . insulin lispro (HUMALOG KWIKPEN) 100 UNIT/ML KiwkPen INJECT 10-16 UNITS INTO THE SKIN 3 (THREE) TIMES DAILY BEFORE MEALS. 15 pen 5  . Insulin Pen Needle (NOVOFINE) 30G X 8 MM MISC Inject 1 packet into the skin as needed.      Marland Kitchen KLOR-CON/EF 25 MEQ disintegrating tablet TAKE ONE TABLET BY MOUTH IN 6 OUNCES OF WATER DAILY 90 tablet 1  . Lancet Devices (PRODIGY LANCING DEVICE) MISC 1 each by Does not apply route once. 1 each 1  . lidocaine  (LIDODERM) 5 % Place 1 patch onto the skin daily. APPLY 1 PATCH TO THE AFFECTED AREA AND LEAVE IN PLACE FOR 12 HOURS, THEN REMOVE AND LEAVE OFF FOR 12 HOURS    . LORazepam (ATIVAN) 0.5 MG tablet Take 1 tablet (0.5 mg total) by mouth 2 (two) times daily as needed for anxiety. 180 tablet 1  . triamcinolone cream (KENALOG) 0.1 % Apply 1 application topically 2 (two) times daily. 30 g 0   No current facility-administered medications on file prior to visit.    BP 158/90 mmHg  Pulse 73  Temp(Src) 97.8 F (36.6 C) (Oral)  Ht 5' 3.25" (1.607 m)  Wt 178 lb (80.74 kg)  BMI 31.26 kg/m2  SpO2 96%       Review of Systems  Constitutional: Negative.   HENT: Negative for congestion, dental problem, hearing loss, rhinorrhea, sinus pressure, sore throat and tinnitus.   Eyes: Negative for pain, discharge and visual disturbance.  Respiratory: Negative for cough and shortness of breath.   Cardiovascular: Negative for chest pain, palpitations and leg swelling.  Gastrointestinal: Negative for nausea, vomiting, abdominal pain, diarrhea, constipation, blood in stool and abdominal distention.  Genitourinary: Negative for dysuria, urgency, frequency, hematuria, flank pain, vaginal bleeding, vaginal discharge, difficulty urinating, vaginal pain and pelvic pain.  Musculoskeletal: Positive for joint swelling. Negative for arthralgias and gait problem.  Skin: Negative for rash.  Neurological: Negative for dizziness, syncope, speech difficulty, weakness, numbness and headaches.  Hematological: Negative for adenopathy.  Psychiatric/Behavioral: Negative for behavioral problems, dysphoric mood and agitation. The patient is not nervous/anxious.        Objective:   Physical Exam  Constitutional: She appears well-developed and well-nourished. No distress.  Repeat blood pressure 140/80  Musculoskeletal:  Soft tissue swelling, excessive warmth and tenderness left elbow          Assessment & Plan:   Acute  gout, left elbow.  Will increase Colchicine to 3 times a day with meals.  Hydrocodone refill Diabetes mellitus.  Will check hemoglobin A1c.  Switch to NovoLog for insurance purposes.  Unclear compliance due to cost issues.  Patient has discontinued Lantus due to cost Hypertension.  Compliance encouraged

## 2015-11-29 NOTE — Progress Notes (Signed)
Pre visit review using our clinic review tool, if applicable. No additional management support is needed unless otherwise documented below in the visit note. 

## 2015-11-30 NOTE — Addendum Note (Signed)
Addended by: Griselda MinerJIMENEZ, Jalah Warmuth E on: 11/30/2015 11:17 AM   Modules accepted: Orders

## 2015-12-21 ENCOUNTER — Telehealth: Payer: Self-pay | Admitting: Internal Medicine

## 2015-12-21 NOTE — Telephone Encounter (Signed)
[facility] calling b/c they saw this patient in March and she was due for follow up in July. She no showed the appointment in July 22. They have tried numerous times (4)  to contact the patient and she has not returned any calls to reschedule. Pt will need to contact then if she would like to be seen.

## 2015-12-21 NOTE — Telephone Encounter (Signed)
Please schedule follow-up with renal medicine and stress   the importance of this follow-up evaluation

## 2015-12-21 NOTE — Telephone Encounter (Signed)
FYI

## 2015-12-26 NOTE — Telephone Encounter (Signed)
Spoke to pt, told her Dr. Kirtland BouchardK would like you to follow up with [facility] they have been trying to get in touch with you. Pt said she told dr. Kirtland BouchardK that she did not like them and does not want to go back there. Pt also said she was told everything was good and does not understanding why she has to go back. Told her need to discuss with Dr. Kirtland BouchardK. Pt said she has an appt tomorrow. Told her okay can discuss it then. Pt verbalized understanding.

## 2015-12-27 ENCOUNTER — Encounter: Payer: Self-pay | Admitting: Internal Medicine

## 2015-12-27 ENCOUNTER — Ambulatory Visit (INDEPENDENT_AMBULATORY_CARE_PROVIDER_SITE_OTHER): Payer: Commercial Managed Care - HMO | Admitting: Internal Medicine

## 2015-12-27 VITALS — BP 166/90 | HR 70 | Temp 97.7°F | Resp 20 | Ht 63.25 in | Wt 175.0 lb

## 2015-12-27 DIAGNOSIS — E0821 Diabetes mellitus due to underlying condition with diabetic nephropathy: Secondary | ICD-10-CM

## 2015-12-27 DIAGNOSIS — I10 Essential (primary) hypertension: Secondary | ICD-10-CM

## 2015-12-27 DIAGNOSIS — Z794 Long term (current) use of insulin: Secondary | ICD-10-CM | POA: Diagnosis not present

## 2015-12-27 DIAGNOSIS — E1121 Type 2 diabetes mellitus with diabetic nephropathy: Secondary | ICD-10-CM

## 2015-12-27 DIAGNOSIS — E7800 Pure hypercholesterolemia, unspecified: Secondary | ICD-10-CM | POA: Diagnosis not present

## 2015-12-27 MED ORDER — HYDROCODONE-ACETAMINOPHEN 10-325 MG PO TABS: 0.5000 | ORAL_TABLET | Freq: Three times a day (TID) | ORAL | Status: DC | PRN

## 2015-12-27 NOTE — Progress Notes (Signed)
Subjective:    Patient ID: Sarah [name], female    DOB: 09/03/1951, 65 y.o.   MRN: [id number]  HPI  65 year old patient who has a history of insulin requiring diabetes with nephropathy.  She has been seen by nephrology in the past, but refuses referral at this time.  She has been contacted by endocrine and seems agreeable to follow-up for further diabetic management.   At the present time.  She is not taking basal therapy, although apparently she has Lantus refrigerated at home.  She states that she is using mealtime insulin 3 times daily Fasting blood sugar at home earlier today 190 Random blood sugar here today 114  She complains of being extremely nervous  Lab Results  Component Value Date   HGBA1C 8.3* 11/29/2015   Past Medical History  Diagnosis Date  . Diabetes mellitus   . Hypertension   . Hyperlipidemia   . Anxiety   . Chronic kidney disease   . Peripheral neuropathy ([place])   . Allergy   . Arthritis   . Cataract   . Heart murmur   . Stroke [facility])     Social History   Social History  . Marital Status: Single    Spouse Name: N/A  . Number of Children: N/A  . Years of Education: N/A   Occupational History  . Not on file.   Social History Main Topics  . Smoking status: Never Smoker   . Smokeless tobacco: Never Used  . Alcohol Use: No  . Drug Use: No  . Sexual Activity: Not on file   Other Topics Concern  . Not on file   Social History Narrative    Past Surgical History  Procedure Laterality Date  . Tubal ligation    . Hemorrhoid surgery    . Neck surgery    . Cataract extraction, bilateral    . Appendectomy    . Eye surgery    . Fracture surgery      L foot  . Spine surgery      cervical spine    Family History  Problem Relation Age of Onset  . Cancer Mother   . Hypertension Mother   . Diabetes Mother   . Heart disease Father     Allergies  Allergen Reactions  . Metoprolol Swelling  . Cefaclor Hives  . Tetanus Toxoid Adsorbed  Swelling    Current Outpatient Prescriptions on File Prior to Visit  Medication Sig Dispense Refill  . ACCU-CHEK FASTCLIX LANCETS MISC Use daily. 102 each 5  . Alcohol Swabs (B-D SINGLE USE SWABS REGULAR) PADS Use as directed 100 each 4  . allopurinol (ZYLOPRIM) 100 MG tablet Take 1 tablet (100 mg total) by mouth daily. 90 tablet 1  . amLODipine (NORVASC) 10 MG tablet TAKE 1 TABLET EVERY DAY 90 tablet 3  . aspirin 81 MG EC tablet TAKE 1 TABLET BY MOUTH DAILY. SWALLOW WHOLE. 90 tablet 3  . atorvastatin (LIPITOR) 80 MG tablet TAKE 1 TABLET (80 MG TOTAL) BY MOUTH DAILY. 90 tablet 1  . Blood Glucose Monitoring Suppl (ACCU-CHEK NANO SMARTVIEW) W/DEVICE KIT USE AS DIRECTED 1 kit 0  . colchicine 0.6 MG tablet Take 1 tablet (0.6 mg total) by mouth 2 (two) times daily. 60 tablet 5  . cyclobenzaprine (FLEXERIL) 5 MG tablet Take 1 tablet (5 mg total) by mouth 3 (three) times daily as needed for muscle spasms. 90 tablet 2  . ezetimibe (ZETIA) 10 MG tablet Take 1 tablet (10 mg total)  by mouth daily. 90 tablet 1  . furosemide (LASIX) 20 MG tablet TAKE 1 TABLET (20 MG TOTAL) BY MOUTH DAILY. 30 tablet 5  . glucose blood (ACCU-CHEK SMARTVIEW) test strip USE TO CHECK BLOOD SUGAR BEFORE MEALS AND AT BEDTIME 400 each 3  . HUMALOG KWIKPEN 100 UNIT/ML KiwkPen INJECT 10-16 UNITS INTO THE SKIN 3 (THREE) TIMES DAILY BEFORE MEALS. 15 pen 2  . insulin aspart (NOVOLOG) 100 UNIT/ML injection Inject 15 Units into the skin 3 (three) times daily before meals. 3 vial PRN  . insulin glargine (LANTUS) 100 UNIT/ML injection 10 units at bedtime 15 mL 6  . Insulin Pen Needle (NOVOFINE) 30G X 8 MM MISC Inject 1 packet into the skin as needed.      Marland Kitchen KLOR-CON/EF 25 MEQ disintegrating tablet TAKE ONE TABLET BY MOUTH IN 6 OUNCES OF WATER DAILY 90 tablet 1  . Lancet Devices (PRODIGY LANCING DEVICE) MISC 1 each by Does not apply route once. 1 each 1  . lidocaine (LIDODERM) 5 % Place 1 patch onto the skin daily. APPLY 1 PATCH TO THE  AFFECTED AREA AND LEAVE IN PLACE FOR 12 HOURS, THEN REMOVE AND LEAVE OFF FOR 12 HOURS    . LORazepam (ATIVAN) 0.5 MG tablet Take 1 tablet (0.5 mg total) by mouth 2 (two) times daily as needed for anxiety. 180 tablet 1  . triamcinolone cream (KENALOG) 0.1 % Apply 1 application topically 2 (two) times daily. 30 g 0   No current facility-administered medications on file prior to visit.    BP 166/90 mmHg  Pulse 70  Temp(Src) 97.7 F (36.5 C) (Oral)  Resp 20  Ht 5' 3.25" (1.607 m)  Wt 175 lb (79.379 kg)  BMI 30.74 kg/m2  SpO2 99%      Review of Systems  Constitutional: Negative.   HENT: Negative for congestion, dental problem, hearing loss, rhinorrhea, sinus pressure, sore throat and tinnitus.   Eyes: Negative for pain, discharge and visual disturbance.  Respiratory: Negative for cough and shortness of breath.   Cardiovascular: Negative for chest pain, palpitations and leg swelling.  Gastrointestinal: Negative for nausea, vomiting, abdominal pain, diarrhea, constipation, blood in stool and abdominal distention.  Genitourinary: Negative for dysuria, urgency, frequency, hematuria, flank pain, vaginal bleeding, vaginal discharge, difficulty urinating, vaginal pain and pelvic pain.  Musculoskeletal: Negative for joint swelling, arthralgias and gait problem.  Skin: Negative for rash.  Neurological: Negative for dizziness, syncope, speech difficulty, weakness, numbness and headaches.  Hematological: Negative for adenopathy.  Psychiatric/Behavioral: Negative for behavioral problems, dysphoric mood and agitation. The patient is nervous/anxious.        Objective:   Physical Exam  Constitutional: She is oriented to person, place, and time. She appears well-developed and well-nourished.  Blood pressure on arrival 166 over 90 Follow-up blood pressure 140/82  HENT:  Head: Normocephalic.  Right Ear: External ear normal.  Left Ear: External ear normal.  Mouth/Throat: Oropharynx is clear and  moist.  Eyes: Conjunctivae and EOM are normal. Pupils are equal, round, and reactive to light.  Neck: Normal range of motion. Neck supple. No thyromegaly present.  Cardiovascular: Normal rate, regular rhythm, normal heart sounds and intact distal pulses.   Pulmonary/Chest: Effort normal and breath sounds normal.  Abdominal: Soft. Bowel sounds are normal. She exhibits no mass. There is no tenderness.  Musculoskeletal: Normal range of motion.  Lymphadenopathy:    She has no cervical adenopathy.  Neurological: She is alert and oriented to person, place, and time.  Skin: Skin is  warm and dry. No rash noted.  Psychiatric: She has a normal mood and affect. Her behavior is normal.          Assessment & Plan:  Diabetes mellitus.  Suboptimal control.  Complicated by chronic kidney disease. Periodic eye examinations recommended The patient is apparently out of the "doughnut hole"and hopefully can afford her medicines and take more faithfully.  She was urged to resume Lantus at 10 units at bedtime.  Encouraged to follow-up with endocrinology  Hypertension.  No change in medical regimen at this time.  Blood pressure has improved and she is quite anxious today  Recheck 3 months

## 2015-12-27 NOTE — Progress Notes (Signed)
Pre visit review using our clinic review tool, if applicable. No additional management support is needed unless otherwise documented below in the visit note. 

## 2015-12-27 NOTE — Patient Instructions (Signed)
Follow-up diabetic specialist as discussed   Please check your hemoglobin A1c every 3 months  Limit your sodium (Salt) intake  Please check your blood pressure on a regular basis.  If it is consistently greater than 150/90, please make an office appointment.  Please see your eye doctor regularly to check for diabetic eye damage

## 2016-01-22 ENCOUNTER — Telehealth: Payer: Self-pay | Admitting: Internal Medicine

## 2016-01-22 NOTE — Telephone Encounter (Signed)
Tried to contact pt no answer. Need to clarify if using Novolog or Humalog insulin.

## 2016-01-22 NOTE — Telephone Encounter (Signed)
Pt needs a new rx novolog flex pens.[facility] [address]. Pt also needs a samples

## 2016-01-23 MED ORDER — INSULIN ASPART 100 UNIT/ML FLEXPEN: 15.0000 [IU] | PEN_INJECTOR | Freq: Three times a day (TID) | SUBCUTANEOUS | Status: DC

## 2016-01-23 NOTE — Telephone Encounter (Signed)
Left message on voicemail to call office.  

## 2016-01-23 NOTE — Telephone Encounter (Signed)
Pt states the pharm told her she can get the Novolog flexplen is a less amount for her to pay.  Humalog was almost $200. Would like Novolg flexpen. ($47)

## 2016-01-23 NOTE — Telephone Encounter (Signed)
Spoke to pt, told her Rx for Novolog Flex pens were sent to pharmacy. Pt verbalized understanding.

## 2016-01-26 ENCOUNTER — Telehealth: Payer: Self-pay | Admitting: Internal Medicine

## 2016-01-26 NOTE — Telephone Encounter (Signed)
[facility] called to advise they have tried multiple times to contact pt for a follow up visit.  Pt will not return their calls. Pt only saw them one time Jan 2016 and was a no show for her July 2016 follow up appt. A new referral was placed, and they have tried multiple times to contact pt, but pt will not return calls.  Michelle at [facility] states at this point the pt will need a new referral if pt needs to be seen. This one has expired.

## 2016-01-27 NOTE — Telephone Encounter (Signed)
FYI, I have talked to the pt before about this and she refuses to go.

## 2016-02-02 ENCOUNTER — Telehealth: Payer: Self-pay | Admitting: Internal Medicine

## 2016-02-02 MED ORDER — HYDROCODONE-ACETAMINOPHEN 10-325 MG PO TABS: 0.5000 | ORAL_TABLET | Freq: Three times a day (TID) | ORAL | Status: DC | PRN

## 2016-02-02 NOTE — Telephone Encounter (Signed)
° ° °  Pt request refill of the following: ° ° °HYDROcodone-acetaminophen (NORCO) 10-325 MG tablet ° ° °Phamacy: °

## 2016-02-02 NOTE — Telephone Encounter (Signed)
Pt notified Rx ready for pickup. Rx printed and signed.  

## 2016-02-08 ENCOUNTER — Other Ambulatory Visit: Payer: Self-pay | Admitting: Internal Medicine

## 2016-02-28 ENCOUNTER — Emergency Department (HOSPITAL_BASED_OUTPATIENT_CLINIC_OR_DEPARTMENT_OTHER)
Admission: EM | Admit: 2016-02-28 | Discharge: 2016-02-28 | Disposition: A | Discharge status: Home / Self Care | Payer: Commercial Managed Care - HMO | Attending: Emergency Medicine | Admitting: Emergency Medicine

## 2016-02-28 ENCOUNTER — Encounter (HOSPITAL_BASED_OUTPATIENT_CLINIC_OR_DEPARTMENT_OTHER): Payer: Self-pay

## 2016-02-28 DIAGNOSIS — Z794 Long term (current) use of insulin: Secondary | ICD-10-CM | POA: Insufficient documentation

## 2016-02-28 DIAGNOSIS — Z7982 Long term (current) use of aspirin: Secondary | ICD-10-CM | POA: Diagnosis not present

## 2016-02-28 DIAGNOSIS — I129 Hypertensive chronic kidney disease with stage 1 through stage 4 chronic kidney disease, or unspecified chronic kidney disease: Secondary | ICD-10-CM | POA: Insufficient documentation

## 2016-02-28 DIAGNOSIS — M1990 Unspecified osteoarthritis, unspecified site: Secondary | ICD-10-CM | POA: Diagnosis not present

## 2016-02-28 DIAGNOSIS — R011 Cardiac murmur, unspecified: Secondary | ICD-10-CM | POA: Insufficient documentation

## 2016-02-28 DIAGNOSIS — M25521 Pain in right elbow: Secondary | ICD-10-CM | POA: Diagnosis present

## 2016-02-28 DIAGNOSIS — R443 Hallucinations, unspecified: Secondary | ICD-10-CM | POA: Insufficient documentation

## 2016-02-28 DIAGNOSIS — F419 Anxiety disorder, unspecified: Secondary | ICD-10-CM | POA: Insufficient documentation

## 2016-02-28 DIAGNOSIS — Z79899 Other long term (current) drug therapy: Secondary | ICD-10-CM | POA: Diagnosis not present

## 2016-02-28 DIAGNOSIS — M109 Gout, unspecified: Secondary | ICD-10-CM | POA: Diagnosis not present

## 2016-02-28 DIAGNOSIS — Z7952 Long term (current) use of systemic steroids: Secondary | ICD-10-CM | POA: Diagnosis not present

## 2016-02-28 DIAGNOSIS — N189 Chronic kidney disease, unspecified: Secondary | ICD-10-CM | POA: Diagnosis not present

## 2016-02-28 DIAGNOSIS — Z8669 Personal history of other diseases of the nervous system and sense organs: Secondary | ICD-10-CM | POA: Insufficient documentation

## 2016-02-28 DIAGNOSIS — M10021 Idiopathic gout, right elbow: Secondary | ICD-10-CM | POA: Insufficient documentation

## 2016-02-28 DIAGNOSIS — Z8673 Personal history of transient ischemic attack (TIA), and cerebral infarction without residual deficits: Secondary | ICD-10-CM | POA: Diagnosis not present

## 2016-02-28 DIAGNOSIS — E785 Hyperlipidemia, unspecified: Secondary | ICD-10-CM | POA: Diagnosis not present

## 2016-02-28 HISTORY — DX: Gout, unspecified: M10.9

## 2016-02-28 MED ORDER — MORPHINE SULFATE (PF) 4 MG/ML IV SOLN
8.0000 mg | Freq: Once | INTRAVENOUS | Status: AC
  Administered 2016-02-28: 8 mg via INTRAMUSCULAR
  Filled 2016-02-28: qty 2

## 2016-02-28 MED ORDER — HYDROCODONE-ACETAMINOPHEN 5-325 MG PO TABS: 1.0000 | ORAL_TABLET | ORAL | Status: DC | PRN

## 2016-02-28 MED ORDER — DEXAMETHASONE 4 MG PO TABS
4.0000 mg | ORAL_TABLET | Freq: Once | ORAL | Status: AC
  Administered 2016-02-28: 4 mg via ORAL
  Filled 2016-02-28: qty 1

## 2016-02-28 MED ORDER — COLCHICINE 0.6 MG PO TABS: 0.6000 mg | ORAL_TABLET | Freq: Every day | ORAL | Status: DC

## 2016-02-28 MED ORDER — METHYLPREDNISOLONE 4 MG PO TBPK: ORAL_TABLET | ORAL | Status: DC

## 2016-02-28 MED ORDER — ONDANSETRON 4 MG PO TBDP
4.0000 mg | ORAL_TABLET | Freq: Once | ORAL | Status: AC
  Administered 2016-02-28: 4 mg via ORAL
  Filled 2016-02-28: qty 1

## 2016-02-28 NOTE — ED Provider Notes (Signed)
CSN: [id number]     Arrival date & time 02/28/16  2130 History  By signing my name below, I, Rowan Blase, attest that this documentation has been prepared under the direction and in the presence of Tanna Furry, MD . Electronically Signed: Rowan Blase, Scribe. 02/28/2016. 10:09 PM.   Chief Complaint  Patient presents with  . Elbow Pain   The history is provided by the patient. No language interpreter was used.   HPI Comments:  Sarah [name] is a 65 y.o. female with PMHx of HTN, IDDM, HLD, Gout, and CKD who presents to the Emergency Department complaining of moderate-severe right elbow pain radiating down to right fingertips onset two days ago. She has a h/o gout in left hand and left toe. She takes allopurinol regularly for gout and colchicine as needed for flare-ups; she reports taking 2 colchicine yesterday and 2 today. Pt reports her baseline blood sugar is low and she states she rarely has high readings. Pt denies any trauma, pain radiating up her right arm, or h/o of pain in right elbow.  Past Medical History  Diagnosis Date  . Diabetes mellitus   . Hypertension   . Hyperlipidemia   . Anxiety   . Chronic kidney disease   . Peripheral neuropathy ([place])   . Allergy   . Arthritis   . Cataract   . Heart murmur   . Stroke ([place])   . Gout    Past Surgical History  Procedure Laterality Date  . Tubal ligation    . Hemorrhoid surgery    . Neck surgery    . Cataract extraction, bilateral    . Appendectomy    . Eye surgery    . Fracture surgery      L foot  . Spine surgery      cervical spine   Family History  Problem Relation Age of Onset  . Cancer Mother   . Hypertension Mother   . Diabetes Mother   . Heart disease Father    Social History  Substance Use Topics  . Smoking status: Never Smoker   . Smokeless tobacco: Never Used  . Alcohol Use: No   OB History    No data available     Review of Systems  Constitutional: Negative for fever, chills, diaphoresis,  appetite change and fatigue.  HENT: Negative for mouth sores, sore throat and trouble swallowing.   Eyes: Negative for visual disturbance.  Respiratory: Negative for cough, chest tightness, shortness of breath and wheezing.   Cardiovascular: Negative for chest pain.  Gastrointestinal: Negative for nausea, vomiting, abdominal pain, diarrhea and abdominal distention.  Endocrine: Negative for polydipsia, polyphagia and polyuria.  Genitourinary: Negative for dysuria, frequency and hematuria.  Musculoskeletal: Positive for arthralgias (right elbow). Negative for gait problem.  Skin: Negative for color change, pallor and rash.  Neurological: Negative for dizziness, syncope, light-headedness and headaches.  Hematological: Does not bruise/bleed easily.  Psychiatric/Behavioral: Negative for behavioral problems and confusion.    Allergies  Metoprolol; Cefaclor; and Tetanus toxoid adsorbed  Home Medications   Prior to Admission medications   Medication Sig Start Date End Date Taking? Authorizing Provider  ACCU-CHEK FASTCLIX LANCETS MISC USE TO CHECK BLOOD SUGAR THREE TIMES A DAY BEFORE MEALS AND AT BEDTIME 02/09/16   Marletta Lor, MD  Alcohol Swabs (B-D SINGLE USE SWABS REGULAR) PADS Use as directed 06/29/14   Marletta Lor, MD  allopurinol (ZYLOPRIM) 100 MG tablet Take 1 tablet (100 mg total) by mouth daily. 09/30/14  Marletta Lor, MD  amLODipine (NORVASC) 10 MG tablet TAKE 1 TABLET EVERY DAY 08/16/15   Marletta Lor, MD  aspirin 81 MG EC tablet TAKE 1 TABLET BY MOUTH DAILY. SWALLOW WHOLE. 10/31/15   Marletta Lor, MD  atorvastatin (LIPITOR) 80 MG tablet TAKE 1 TABLET (80 MG TOTAL) BY MOUTH DAILY. 08/11/15   Marletta Lor, MD  Blood Glucose Monitoring Suppl (ACCU-CHEK NANO SMARTVIEW) W/DEVICE KIT USE AS DIRECTED 12/22/14   Marletta Lor, MD  colchicine 0.6 MG tablet Take 1 tablet (0.6 mg total) by mouth daily. 02/28/16 03/01/16  Tanna Furry, MD  cyclobenzaprine  (FLEXERIL) 5 MG tablet Take 1 tablet (5 mg total) by mouth 3 (three) times daily as needed for muscle spasms. 04/07/15   Marletta Lor, MD  ezetimibe (ZETIA) 10 MG tablet Take 1 tablet (10 mg total) by mouth daily. 07/11/14   Marletta Lor, MD  furosemide (LASIX) 20 MG tablet TAKE 1 TABLET (20 MG TOTAL) BY MOUTH DAILY. 08/22/15   Marletta Lor, MD  glucose blood (ACCU-CHEK SMARTVIEW) test strip USE TO CHECK BLOOD SUGAR THREE TIMES A DAY BEFORE MEALS AND AT BEDTIME 02/09/16   Marletta Lor, MD  HUMALOG KWIKPEN 100 UNIT/ML KiwkPen INJECT 10-16 UNITS INTO THE SKIN 3 (THREE) TIMES DAILY BEFORE MEALS. 04/04/15   Marletta Lor, MD  HYDROcodone-acetaminophen (NORCO/VICODIN) 5-325 MG tablet Take 1 tablet by mouth every 4 (four) hours as needed. 02/28/16   Tanna Furry, MD  insulin aspart (NOVOLOG) 100 UNIT/ML FlexPen Inject 15 Units into the skin 3 (three) times daily with meals. 01/23/16   Marletta Lor, MD  insulin glargine (LANTUS) 100 UNIT/ML injection 10 units at bedtime 01/11/15   Marletta Lor, MD  Insulin Pen Needle (NOVOFINE) 30G X 8 MM MISC Inject 1 packet into the skin as needed.      Historical Provider, MD  KLOR-CON/EF 25 MEQ disintegrating tablet TAKE ONE TABLET BY MOUTH IN 6 OUNCES OF WATER DAILY 03/06/15   Marletta Lor, MD  Lancet Devices (PRODIGY LANCING DEVICE) MISC 1 each by Does not apply route once. 06/29/14   Marletta Lor, MD  lidocaine (LIDODERM) 5 % Place 1 patch onto the skin daily. APPLY 1 PATCH TO THE AFFECTED AREA AND LEAVE IN PLACE FOR 12 HOURS, THEN REMOVE AND LEAVE OFF FOR 12 HOURS 10/04/14   Historical Provider, MD  LORazepam (ATIVAN) 0.5 MG tablet Take 1 tablet (0.5 mg total) by mouth 2 (two) times daily as needed for anxiety. 01/07/14   Marletta Lor, MD  methylPREDNISolone (MEDROL DOSEPAK) 4 MG TBPK tablet 6 po on day 1, then decrease by 1 per day 02/28/16   Tanna Furry, MD  triamcinolone cream (KENALOG) 0.1 % Apply 1 application  topically 2 (two) times daily. 10/04/15   Eulas Post, MD   BP 154/71 mmHg  Pulse 74  Temp(Src) 97.8 F (36.6 C) (Oral)  Resp 18  Ht _0  (1.651 m)  Wt 172 lb (78.019 kg)  BMI 28.62 kg/m2  SpO2 98% Physical Exam  Constitutional: She is oriented to person, place, and time. She appears well-developed and well-nourished.  Tearful and sobbing  HENT:  Head: Normocephalic and atraumatic.  Right Ear: Hearing normal.  Left Ear: Hearing normal.  Nose: Nose normal.  Mouth/Throat: Oropharynx is clear and moist and mucous membranes are normal.  Eyes: Conjunctivae and EOM are normal. Pupils are equal, round, and reactive to light.  Neck: Normal range of motion.  Neck supple.  Cardiovascular: Regular rhythm, S1 normal and S2 normal.  Exam reveals no gallop and no friction rub.   No murmur heard. Pulmonary/Chest: Effort normal and breath sounds normal. No respiratory distress. She exhibits no tenderness.  Abdominal: Soft. Normal appearance and bowel sounds are normal. There is no hepatosplenomegaly. There is no tenderness. There is no rebound, no guarding, no tenderness at McBurney's point and negative Murphy's sign. No hernia.  Musculoskeletal:  Holds right elbow at 90 degree flexion; erythema and warmth circumferential at elbow joint; no bursal distention; limited ROM  Neurological: She is alert and oriented to person, place, and time. She has normal strength. No cranial nerve deficit or sensory deficit. Coordination normal. GCS eye subscore is 4. GCS verbal subscore is 5. GCS motor subscore is 6.  Skin: Skin is warm, dry and intact. No rash noted. No cyanosis.  Psychiatric: She has a normal mood and affect. Her speech is normal and behavior is normal. Thought content normal.  Nursing note and vitals reviewed.   ED Course  Procedures  DIAGNOSTIC STUDIES:  Oxygen Saturation is 98% on RA, normal by my interpretation.    COORDINATION OF CARE:  10:03 PM Will administer pain medication  and steroid. Discussed treatment plan with pt at bedside and pt agreed to plan.  Labs Review Labs Reviewed - No data to display  Imaging Review No results found. I have personally reviewed and evaluated these images and lab results as part of my medical decision-making.   EKG Interpretation None      MDM   Final diagnoses:  Acute gout of right elbow, unspecified cause     Patient states that she gets "hallucinations" with prednisone. Will try Medrol. Renal insufficiency, thus no NSAID. Plan coroner steroid, pain control, colchicine with caution about discontinuing if diarrhea develops.  I personally performed the services described in this documentation, which was scribed in my presence. The recorded information has been reviewed and is accurate.     Tanna Furry, MD 02/28/16 2238

## 2016-02-28 NOTE — Discharge Instructions (Signed)

## 2016-02-28 NOTE — ED Notes (Addendum)
Pt c/o pain to right elbow since monday denies injury-hx of gout to elbow per pt's sister-presents to triage in w/c in tears-denies use of cane or walker

## 2016-02-29 ENCOUNTER — Ambulatory Visit (INDEPENDENT_AMBULATORY_CARE_PROVIDER_SITE_OTHER): Payer: Commercial Managed Care - HMO | Admitting: Family Medicine

## 2016-02-29 ENCOUNTER — Encounter: Payer: Self-pay | Admitting: Family Medicine

## 2016-02-29 VITALS — BP 130/80 | HR 80 | Temp 97.8°F | Wt 177.0 lb

## 2016-02-29 DIAGNOSIS — M10021 Idiopathic gout, right elbow: Secondary | ICD-10-CM

## 2016-02-29 DIAGNOSIS — M109 Gout, unspecified: Secondary | ICD-10-CM

## 2016-02-29 NOTE — Patient Instructions (Signed)
Please fill the prescriptions that were provided to you in the emergency room. Also, continue with preventive medicine allopurinol as prescribed by your primary care physician.  Your physician has referred you for care of your diabetes and kidneys.  Please contact office if you need information regarding how to make an appointment. Also if symptoms do not improve in 3-5 days or worsen, please follow up with your primary care physician.  To prevent gout the minimal uric acid goal is < 7; preferred is < 6, ideal or protective value is < 5 .  The most common cause of elevated uric acid is the ingestion of sugar from high fructose corn syrup sources in processed foods & drinks.  Gout Gout is an inflammatory arthritis caused by a buildup of uric acid crystals in the joints. Uric acid is a chemical that is normally present in the blood. When the level of uric acid in the blood is too high it can form crystals that deposit in your joints and tissues. This causes joint redness, soreness, and swelling (inflammation). Repeat attacks are common. Over time, uric acid crystals can form into masses (tophi) near a joint, destroying bone and causing disfigurement. Gout is treatable and often preventable. CAUSES  The disease begins with elevated levels of uric acid in the blood. Uric acid is produced by your body when it breaks down a naturally found substance called purines. Certain foods you eat, such as meats and fish, contain high amounts of purines. Causes of an elevated uric acid level include:  Being passed down from parent to child (heredity).  Diseases that cause increased uric acid production (such as obesity, psoriasis, and certain cancers).  Excessive alcohol use.  Diet, especially diets rich in meat and seafood.  Medicines, including certain cancer-fighting medicines (chemotherapy), water pills (diuretics), and aspirin.  Chronic kidney disease. The kidneys are no longer able to remove uric acid  well.  Problems with metabolism. Conditions strongly associated with gout include:  Obesity.  High blood pressure.  High cholesterol.  Diabetes. Not everyone with elevated uric acid levels gets gout. It is not understood why some people get gout and others do not. Surgery, joint injury, and eating too much of certain foods are some of the factors that can lead to gout attacks. SYMPTOMS   An attack of gout comes on quickly. It causes intense pain with redness, swelling, and warmth in a joint.  Fever can occur.  Often, only one joint is involved. Certain joints are more commonly involved:  Base of the big toe.  Knee.  Ankle.  Wrist.  Finger. Without treatment, an attack usually goes away in a few days to weeks. Between attacks, you usually will not have symptoms, which is different from many other forms of arthritis. DIAGNOSIS  Your caregiver will suspect gout based on your symptoms and exam. In some cases, tests may be recommended. The tests may include:  Blood tests.  Urine tests.  X-rays.  Joint fluid exam. This exam requires a needle to remove fluid from the joint (arthrocentesis). Using a microscope, gout is confirmed when uric acid crystals are seen in the joint fluid. TREATMENT  There are two phases to gout treatment: treating the sudden onset (acute) attack and preventing attacks (prophylaxis).  Treatment of an Acute Attack.  Medicines are used. These include anti-inflammatory medicines or steroid medicines.  An injection of steroid medicine into the affected joint is sometimes necessary.  The painful joint is rested. Movement can worsen the arthritis.  You may use warm or cold treatments on painful joints, depending which works best for you.  Treatment to Prevent Attacks.  If you suffer from frequent gout attacks, your caregiver may advise preventive medicine. These medicines are started after the acute attack subsides. These medicines either help your  kidneys eliminate uric acid from your body or decrease your uric acid production. You may need to stay on these medicines for a very long time.  The early phase of treatment with preventive medicine can be associated with an increase in acute gout attacks. For this reason, during the first few months of treatment, your caregiver may also advise you to take medicines usually used for acute gout treatment. Be sure you understand your caregiver's directions. Your caregiver may make several adjustments to your medicine dose before these medicines are effective.  Discuss dietary treatment with your caregiver or dietitian. Alcohol and drinks high in sugar and fructose and foods such as meat, poultry, and seafood can increase uric acid levels. Your caregiver or dietitian can advise you on drinks and foods that should be limited. HOME CARE INSTRUCTIONS   Do not take aspirin to relieve pain. This raises uric acid levels.  Only take over-the-counter or prescription medicines for pain, discomfort, or fever as directed by your caregiver.  Rest the joint as much as possible. When in bed, keep sheets and blankets off painful areas.  Keep the affected joint raised (elevated).  Apply warm or cold treatments to painful joints. Use of warm or cold treatments depends on which works best for you.  Use crutches if the painful joint is in your leg.  Drink enough fluids to keep your urine clear or pale yellow. This helps your body get rid of uric acid. Limit alcohol, sugary drinks, and fructose drinks.  Follow your dietary instructions. Pay careful attention to the amount of protein you eat. Your daily diet should emphasize fruits, vegetables, whole grains, and fat-free or low-fat milk products. Discuss the use of coffee, vitamin C, and cherries with your caregiver or dietitian. These may be helpful in lowering uric acid levels.  Maintain a healthy body weight. SEEK MEDICAL CARE IF:   You develop diarrhea,  vomiting, or any side effects from medicines.  You do not feel better in 24 hours, or you are getting worse. SEEK IMMEDIATE MEDICAL CARE IF:   Your joint becomes suddenly more tender, and you have chills or a fever. MAKE SURE YOU:   Understand these instructions.  Will watch your condition.  Will get help right away if you are not doing well or get worse.   This information is not intended to replace advice given to you by your health care provider. Make sure you discuss any questions you have with your health care provider.   Document Released: 11/29/2000 Document Revised: 12/23/2014 Document Reviewed: 07/15/2012 [employer] Interactive Patient Education [employer]2016 [employer]

## 2016-02-29 NOTE — Progress Notes (Signed)
7 f Subjective:    Patient ID: Sarah [name], female    DOB: 16-Jul-1951, 65 y.o.   MRN: [id number]  HPI  Sarah [name] is a 65 year old female who presents today after a visit to the ED visit yesterday, 02/28/16 for right elbow pain due to gout. The pain has been present now for 4 days and is noted as a sharp pain, rated as a 6 and radiates to hand. This pain is unchanged per patient report at the time she left the ED. She denies history of trauma or injury to the area.  She denies numbness and tingling in fingers today.  Pertinent history includes HTN, IDDM, HLD, Gout, and CKD. Treatment includes allopurinol regularly and cochicine for flare ups, however patient reports that she has not been taking her allopurinol regularly. At the ED she was treated with morphine 47m/ml, ondansetron, and dexamethasone and discharged with a medrol dosepak, norco, and colchicine. Of note, she states that she has not filled her prescriptions for the medrol dosepak and norco, or taken anything for pain relief today. She does not provide a clear answer regarding why she has not filled her prescriptions. She only states that these are different medications than she has taken in the past.   Fasting Blood sugar per patient report  today is noted as 179.  Lab Results  Component Value Date   LABURIC 9.2* 11/29/2015   PCP has placed referrals 11/2015 for endocrinology and nephrology due to uncontrolled diabetes and deterioration in kidney function. Patient reports that she has not made an appointment with either specialist at this time.    Review of Systems  Constitutional: Negative for fever, chills and fatigue.  Eyes: Negative for visual disturbance.  Respiratory: Negative for cough, chest tightness and shortness of breath.   Cardiovascular: Negative for chest pain and palpitations.  Gastrointestinal: Negative for nausea, vomiting, abdominal pain and diarrhea.  Endocrine: Negative for polydipsia, polyphagia and  polyuria.  Genitourinary: Negative for dysuria, urgency and hematuria.  Musculoskeletal: Positive for joint swelling and arthralgias.  Skin: Negative for rash.  Neurological: Negative for dizziness, light-headedness and headaches.       Past Medical History  Diagnosis Date  . Diabetes mellitus   . Hypertension   . Hyperlipidemia   . Anxiety   . Chronic kidney disease   . [place]ipheral neuropathy ([place]   . Allergy   . Arthritis   . Cataract   . Heart murmur   . [place]   . Gout     Social History   Social History  . Marital Status: Single    Spouse Name: N/A  . Number of Children: N/A  . Years of Education: N/A   Occupational History  . Not on file.   Social History Main Topics  . Smoking status: Never Smoker   . Smokeless tobacco: Never Used  . Alcohol Use: No  . Drug Use: No  . Sexual Activity: Not on file   Other Topics Concern  . Not on file   Social History Narrative    Past Surgical History  Procedure Laterality Date  . Tubal ligation    . Hemorrhoid surgery    . Neck surgery    . Cataract extraction, bilateral    . Appendectomy    . Eye surgery    . Fracture surgery      L foot  . Spine surgery      cervical spine    Family History  Problem Relation  Age of Onset  . Cancer Mother   . Hypertension Mother   . Diabetes Mother   . Heart disease Father     Allergies  Allergen Reactions  . Metoprolol Swelling  . Cefaclor Hives  . Tetanus Toxoid Adsorbed Swelling    Current Outpatient Prescriptions on File Prior to Visit  Medication Sig Dispense Refill  . ACCU-CHEK FASTCLIX LANCETS MISC USE TO CHECK BLOOD SUGAR THREE TIMES A DAY BEFORE MEALS AND AT BEDTIME 102 each 5  . Alcohol Swabs (B-D SINGLE USE SWABS REGULAR) PADS Use as directed 100 each 4  . allopurinol (ZYLOPRIM) 100 MG tablet Take 1 tablet (100 mg total) by mouth daily. 90 tablet 1  . amLODipine (NORVASC) 10 MG tablet TAKE 1 TABLET EVERY DAY 90 tablet 3  . aspirin 81 MG EC  tablet TAKE 1 TABLET BY MOUTH DAILY. SWALLOW WHOLE. 90 tablet 3  . atorvastatin (LIPITOR) 80 MG tablet TAKE 1 TABLET (80 MG TOTAL) BY MOUTH DAILY. 90 tablet 1  . Blood Glucose Monitoring Suppl (ACCU-CHEK NANO SMARTVIEW) W/DEVICE KIT USE AS DIRECTED 1 kit 0  . colchicine 0.6 MG tablet Take 1 tablet (0.6 mg total) by mouth daily. 10 tablet 0  . cyclobenzaprine (FLEXERIL) 5 MG tablet Take 1 tablet (5 mg total) by mouth 3 (three) times daily as needed for muscle spasms. 90 tablet 2  . ezetimibe (ZETIA) 10 MG tablet Take 1 tablet (10 mg total) by mouth daily. 90 tablet 1  . furosemide (LASIX) 20 MG tablet TAKE 1 TABLET (20 MG TOTAL) BY MOUTH DAILY. 30 tablet 5  . glucose blood (ACCU-CHEK SMARTVIEW) test strip USE TO CHECK BLOOD SUGAR THREE TIMES A DAY BEFORE MEALS AND AT BEDTIME 400 each 3  . HUMALOG KWIKPEN 100 UNIT/ML KiwkPen INJECT 10-16 UNITS INTO THE SKIN 3 (THREE) TIMES DAILY BEFORE MEALS. 15 pen 2  . HYDROcodone-acetaminophen (NORCO/VICODIN) 5-325 MG tablet Take 1 tablet by mouth every 4 (four) hours as needed. 16 tablet 0  . insulin aspart (NOVOLOG) 100 UNIT/ML FlexPen Inject 15 Units into the skin 3 (three) times daily with meals. 15 pen 3  . insulin glargine (LANTUS) 100 UNIT/ML injection 10 units at bedtime 15 mL 6  . Insulin Pen Needle (NOVOFINE) 30G X 8 MM MISC Inject 1 packet into the skin as needed.      Marland Kitchen KLOR-CON/EF 25 MEQ disintegrating tablet TAKE ONE TABLET BY MOUTH IN 6 OUNCES OF WATER DAILY 90 tablet 1  . Lancet Devices (PRODIGY LANCING DEVICE) MISC 1 each by Does not apply route once. 1 each 1  . lidocaine (LIDODERM) 5 % Place 1 patch onto the skin daily. APPLY 1 PATCH TO THE AFFECTED AREA AND LEAVE IN PLACE FOR 12 HOURS, THEN REMOVE AND LEAVE OFF FOR 12 HOURS    . LORazepam (ATIVAN) 0.5 MG tablet Take 1 tablet (0.5 mg total) by mouth 2 (two) times daily as needed for anxiety. 180 tablet 1  . methylPREDNISolone (MEDROL DOSEPAK) 4 MG TBPK tablet 6 po on day 1, then decrease by 1 per  day 21 tablet 0  . triamcinolone cream (KENALOG) 0.1 % Apply 1 application topically 2 (two) times daily. 30 g 0   No current facility-administered medications on file prior to visit.    BP 130/80 mmHg  Pulse 80  Temp(Src) 97.8 F (36.6 C) (Oral)  Wt 177 lb (80.287 kg)    Objective:   Physical Exam  Constitutional: She is oriented to person, place, and time. She appears well-developed  and well-nourished.  Patient tearful at times during history and physical exam  Cardiovascular: Normal rate and regular rhythm.   Pulmonary/Chest: Effort normal and breath sounds normal. She has no wheezes. She has no rales.  Musculoskeletal:  Right arm in a sling held at 90 degree flexion. Upon examination, R elbow demonstrated erythema and warmth with limited ROM due to pain.  Lymphadenopathy:    She has no cervical adenopathy.  Neurological: She is alert and oriented to person, place, and time.  Skin: Skin is warm and dry. No rash noted.  Psychiatric: She has a normal mood and affect. Her behavior is normal.       Assessment & Plan:  1. Acute gout of right elbow, unspecified cause Advised patient to fill prescriptions provided at the ED yesterday. Also advised her to follow up with PCP if symptoms do not improve in 3-5 days, and to schedule a follow up appointment which is due this month for evaluation and management of her chronic conditions.  Discussed supportive measures of rest, applying ice or heat to the area, and avoiding foods that increase uric acid level. Advised her to make appointments with endocrinology and nephrology as directed by her PCP.  Reminded patient to avoid NSAID use due to renal insufficiency. Over 50% of the time with patient was directed toward counseling related to causes of gout and her medication regimen to treat this condition.

## 2016-02-29 NOTE — Progress Notes (Signed)
Pre visit review using our clinic review tool, if applicable. No additional management support is needed unless otherwise documented below in the visit note. 

## 2016-03-06 DIAGNOSIS — E113553 Type 2 diabetes mellitus with stable proliferative diabetic retinopathy, bilateral: Secondary | ICD-10-CM | POA: Diagnosis not present

## 2016-03-06 DIAGNOSIS — H31009 Unspecified chorioretinal scars, unspecified eye: Secondary | ICD-10-CM | POA: Diagnosis not present

## 2016-03-06 DIAGNOSIS — H43392 Other vitreous opacities, left eye: Secondary | ICD-10-CM | POA: Diagnosis not present

## 2016-03-08 ENCOUNTER — Telehealth: Payer: Self-pay | Admitting: Internal Medicine

## 2016-03-08 MED ORDER — HYDROCODONE-ACETAMINOPHEN 10-325 MG PO TABS: 0.5000 | ORAL_TABLET | Freq: Three times a day (TID) | ORAL | Status: DC | PRN

## 2016-03-08 NOTE — Telephone Encounter (Signed)
Pt notified Rx ready for pickup. Rx printed and signed.  

## 2016-03-08 NOTE — Telephone Encounter (Signed)
° ° ° ° ° °  Pt request refill of the following: ° °HYDROcodone-acetaminophen (NORCO/VICODIN) 5-325 MG tablet ° ° °Phamacy: °

## 2016-03-26 ENCOUNTER — Ambulatory Visit (INDEPENDENT_AMBULATORY_CARE_PROVIDER_SITE_OTHER): Payer: Commercial Managed Care - HMO | Admitting: Internal Medicine

## 2016-03-26 ENCOUNTER — Encounter: Payer: Self-pay | Admitting: Internal Medicine

## 2016-03-26 VITALS — BP 140/80 | HR 69 | Temp 97.9°F | Resp 20 | Ht 65.0 in | Wt 174.0 lb

## 2016-03-26 DIAGNOSIS — M1000 Idiopathic gout, unspecified site: Secondary | ICD-10-CM

## 2016-03-26 DIAGNOSIS — Z794 Long term (current) use of insulin: Secondary | ICD-10-CM | POA: Diagnosis not present

## 2016-03-26 DIAGNOSIS — I10 Essential (primary) hypertension: Secondary | ICD-10-CM | POA: Diagnosis not present

## 2016-03-26 DIAGNOSIS — E0821 Diabetes mellitus due to underlying condition with diabetic nephropathy: Secondary | ICD-10-CM | POA: Diagnosis not present

## 2016-03-26 DIAGNOSIS — E7800 Pure hypercholesterolemia, unspecified: Secondary | ICD-10-CM

## 2016-03-26 LAB — BASIC METABOLIC PANEL
BUN: 27 mg/dL — AB (ref 6–23)
CO2: 28 meq/L (ref 19–32)
Calcium: 9.3 mg/dL (ref 8.4–10.5)
Chloride: 103 mEq/L (ref 96–112)
Creatinine, Ser: 2.2 mg/dL — ABNORMAL HIGH (ref 0.40–1.20)
GFR: 28.75 mL/min — AB (ref >60.00)
GLUCOSE: 194 mg/dL — AB (ref 70–99)
POTASSIUM: 4 meq/L (ref 3.5–5.1)
Sodium: 141 mEq/L (ref 135–145)

## 2016-03-26 LAB — HEMOGLOBIN A1C: Hgb A1c MFr Bld: 8.4 % — ABNORMAL HIGH (ref 4.6–6.5)

## 2016-03-26 LAB — URIC ACID: Uric Acid, Serum: 9.4 mg/dL — ABNORMAL HIGH (ref 2.4–7.0)

## 2016-03-26 NOTE — Patient Instructions (Signed)
Resume Lantus 10 units at bedtime  Limit your sodium (Salt) intake    It is important that you exercise regularly, at least 20 minutes 3 to 4 times per week.  If you develop chest pain or shortness of breath seek  medical attention.   Please check your hemoglobin A1c every 3 months

## 2016-03-26 NOTE — Progress Notes (Signed)
Subjective:    Patient ID: Sarah [name], female    DOB: 19-Feb-1951, 65 y.o.   MRN: [id number]  HPI  Lab Results  Component Value Date   HGBA1C 8.3* 11/29/2015   Wt Readings from Last 3 Encounters:  03/26/16 174 lb (78.926 kg)  02/29/16 177 lb (80.287 kg)  02/28/16 172 lb (78.31 kg)    65 year old patient who is seen today for follow-up of diabetes.  This is, gated by chronic kidney disease. She is on basal bolus insulin therapy but states that she has not been taking her bedtime dose of Lantus.  She does have supplies available.  However, she is taking mealtime insulin 3 times daily.  15 units.  She does admit her 5 units if blood sugar is greater than 200 She has rare episodes of hypoglycemia with a low blood sugar over the past 2 months of 57 She is eating better and has started going back to the gym.  She feels quite well today. She has had a recent eye examination and was given a good report and a 9 month interval prior to her next visit  Past Medical History  Diagnosis Date  . Diabetes mellitus   . Hypertension   . Hyperlipidemia   . Anxiety   . Chronic kidney disease   . Peripheral neuropathy ([place])   . Allergy   . Arthritis   . Cataract   . Heart murmur   . Stroke ([place])   . Gout     Social History   Social History  . Marital Status: Single    Spouse Name: N/A  . Number of Children: N/A  . Years of Education: N/A   Occupational History  . Not on file.   Social History Main Topics  . Smoking status: Never Smoker   . Smokeless tobacco: Never Used  . Alcohol Use: No  . Drug Use: No  . Sexual Activity: Not on file   Other Topics Concern  . Not on file   Social History Narrative    Past Surgical History  Procedure Laterality Date  . Tubal ligation    . Hemorrhoid surgery    . Neck surgery    . Cataract extraction, bilateral    . Appendectomy    . Eye surgery    . Fracture surgery      L foot  . Spine surgery      cervical spine    Family  History  Problem Relation Age of Onset  . Cancer Mother   . Hypertension Mother   . Diabetes Mother   . Heart disease Father     Allergies  Allergen Reactions  . Metoprolol Swelling  . Cefaclor Hives  . Tetanus Toxoid Adsorbed Swelling    Current Outpatient Prescriptions on File Prior to Visit  Medication Sig Dispense Refill  . ACCU-CHEK FASTCLIX LANCETS MISC USE TO CHECK BLOOD SUGAR THREE TIMES A DAY BEFORE MEALS AND AT BEDTIME 102 each 5  . Alcohol Swabs (B-D SINGLE USE SWABS REGULAR) PADS Use as directed 100 each 4  . allopurinol (ZYLOPRIM) 100 MG tablet Take 1 tablet (100 mg total) by mouth daily. 90 tablet 1  . amLODipine (NORVASC) 10 MG tablet TAKE 1 TABLET EVERY DAY 90 tablet 3  . aspirin 81 MG EC tablet TAKE 1 TABLET BY MOUTH DAILY. SWALLOW WHOLE. 90 tablet 3  . atorvastatin (LIPITOR) 80 MG tablet TAKE 1 TABLET (80 MG TOTAL) BY MOUTH DAILY. 90 tablet 1  . Blood  Glucose Monitoring Suppl (ACCU-CHEK NANO SMARTVIEW) W/DEVICE KIT USE AS DIRECTED 1 kit 0  . cyclobenzaprine (FLEXERIL) 5 MG tablet Take 1 tablet (5 mg total) by mouth 3 (three) times daily as needed for muscle spasms. 90 tablet 2  . ezetimibe (ZETIA) 10 MG tablet Take 1 tablet (10 mg total) by mouth daily. 90 tablet 1  . furosemide (LASIX) 20 MG tablet TAKE 1 TABLET (20 MG TOTAL) BY MOUTH DAILY. 30 tablet 5  . glucose blood (ACCU-CHEK SMARTVIEW) test strip USE TO CHECK BLOOD SUGAR THREE TIMES A DAY BEFORE MEALS AND AT BEDTIME 400 each 3  . HUMALOG KWIKPEN 100 UNIT/ML KiwkPen INJECT 10-16 UNITS INTO THE SKIN 3 (THREE) TIMES DAILY BEFORE MEALS. 15 pen 2  . HYDROcodone-acetaminophen (NORCO) 10-325 MG tablet Take 0.5-1 tablets by mouth every 8 (eight) hours as needed. 90 tablet 0  . insulin aspart (NOVOLOG) 100 UNIT/ML FlexPen Inject 15 Units into the skin 3 (three) times daily with meals. 15 pen 3  . insulin glargine (LANTUS) 100 UNIT/ML injection 10 units at bedtime 15 mL 6  . Insulin Pen Needle (NOVOFINE) 30G X 8 MM MISC  Inject 1 packet into the skin as needed.      Marland Kitchen KLOR-CON/EF 25 MEQ disintegrating tablet TAKE ONE TABLET BY MOUTH IN 6 OUNCES OF WATER DAILY 90 tablet 1  . Lancet Devices (PRODIGY LANCING DEVICE) MISC 1 each by Does not apply route once. 1 each 1  . lidocaine (LIDODERM) 5 % Place 1 patch onto the skin daily. APPLY 1 PATCH TO THE AFFECTED AREA AND LEAVE IN PLACE FOR 12 HOURS, THEN REMOVE AND LEAVE OFF FOR 12 HOURS    . LORazepam (ATIVAN) 0.5 MG tablet Take 1 tablet (0.5 mg total) by mouth 2 (two) times daily as needed for anxiety. 180 tablet 1  . triamcinolone cream (KENALOG) 0.1 % Apply 1 application topically 2 (two) times daily. 30 g 0  . colchicine 0.6 MG tablet Take 1 tablet (0.6 mg total) by mouth daily. 10 tablet 0   No current facility-administered medications on file prior to visit.    BP 140/80 mmHg  Pulse 69  Temp(Src) 97.9 F (36.6 C) (Oral)  Resp 20  Ht '5\' 5"'  (1.651 m)  Wt 174 lb (78.926 kg)  BMI 28.96 kg/m2  SpO2 97%     Review of Systems  Constitutional: Negative.   HENT: Negative for congestion, dental problem, hearing loss, rhinorrhea, sinus pressure, sore throat and tinnitus.   Eyes: Negative for pain, discharge and visual disturbance.  Respiratory: Negative for cough and shortness of breath.   Cardiovascular: Negative for chest pain, palpitations and leg swelling.  Gastrointestinal: Negative for nausea, vomiting, abdominal pain, diarrhea, constipation, blood in stool and abdominal distention.  Genitourinary: Negative for dysuria, urgency, frequency, hematuria, flank pain, vaginal bleeding, vaginal discharge, difficulty urinating, vaginal pain and pelvic pain.  Musculoskeletal: Negative for joint swelling, arthralgias and gait problem.  Skin: Negative for rash.  Neurological: Negative for dizziness, syncope, speech difficulty, weakness, numbness and headaches.  Hematological: Negative for adenopathy.  Psychiatric/Behavioral: Negative for behavioral problems,  dysphoric mood and agitation. The patient is not nervous/anxious.        Objective:   Physical Exam  Constitutional: She is oriented to person, place, and time. She appears well-developed and well-nourished.  Blood pressure 130/80  HENT:  Head: Normocephalic.  Right Ear: External ear normal.  Left Ear: External ear normal.  Mouth/Throat: Oropharynx is clear and moist.  Eyes: Conjunctivae and EOM are  normal. Pupils are equal, round, and reactive to light.  Neck: Normal range of motion. Neck supple. No thyromegaly present.  Cardiovascular: Normal rate, regular rhythm, normal heart sounds and intact distal pulses.   Pulmonary/Chest: Effort normal and breath sounds normal.  Abdominal: Soft. Bowel sounds are normal. She exhibits no mass. There is no tenderness.  Musculoskeletal: Normal range of motion.  Lymphadenopathy:    She has no cervical adenopathy.  Neurological: She is alert and oriented to person, place, and time.  Skin: Skin is warm and dry. No rash noted.  Psychiatric: She has a normal mood and affect. Her behavior is normal.          Assessment & Plan:   Diabetes mellitus.  Compliance stressed.  Will resume 10 units of Lantus at bedtime.  We'll continue present mealtime regimen with sliding scale coverage.  Check hemoglobin A1c Chronic kidney disease.  We'll recheck renal indices  Patient has been very reluctant to consider nephrology or endocrine referral  Recheck 3 months

## 2016-03-26 NOTE — Progress Notes (Signed)
Pre visit review using our clinic review tool, if applicable. No additional management support is needed unless otherwise documented below in the visit note. 

## 2016-03-27 ENCOUNTER — Other Ambulatory Visit: Payer: Self-pay | Admitting: *Deleted

## 2016-03-27 MED ORDER — ALLOPURINOL 300 MG PO TABS: 300.0000 mg | ORAL_TABLET | Freq: Every day | ORAL | Status: DC

## 2016-04-01 ENCOUNTER — Other Ambulatory Visit: Payer: Self-pay | Admitting: Internal Medicine

## 2016-04-03 ENCOUNTER — Telehealth: Payer: Self-pay | Admitting: Internal Medicine

## 2016-04-03 MED ORDER — COLCRYS 0.6 MG PO TABS: 0.6000 mg | ORAL_TABLET | Freq: Every day | ORAL | Status: DC

## 2016-04-03 MED ORDER — COLCHICINE 0.6 MG PO TABS: 0.6000 mg | ORAL_TABLET | Freq: Every day | ORAL | Status: DC

## 2016-04-03 NOTE — Telephone Encounter (Signed)
Rx sent to pharmacy   

## 2016-04-03 NOTE — Telephone Encounter (Signed)
Pharmacy states pt's insurance requires her to get the brand name for colchicine 0.6 MG tablet  Would like you to resend as  COLCRYS  [facility]

## 2016-04-03 NOTE — Telephone Encounter (Signed)
Pt said her insurance will not pay for the generic of the following med colchicine 0.6 MG tablet it cost her 56.00 the regular will cost 15.00    [facility] [address]

## 2016-04-08 ENCOUNTER — Telehealth: Payer: Self-pay | Admitting: Internal Medicine

## 2016-04-08 MED ORDER — HYDROCODONE-ACETAMINOPHEN 10-325 MG PO TABS: 0.5000 | ORAL_TABLET | Freq: Three times a day (TID) | ORAL | Status: DC | PRN

## 2016-04-08 NOTE — Telephone Encounter (Signed)
Pt notified Rx ready for pickup. Rx printed and signed.  

## 2016-04-08 NOTE — Telephone Encounter (Signed)
Pt needs new rx hydrocodone °

## 2016-05-14 ENCOUNTER — Telehealth: Payer: Self-pay | Admitting: Internal Medicine

## 2016-05-14 MED ORDER — HYDROCODONE-ACETAMINOPHEN 10-325 MG PO TABS: 0.5000 | ORAL_TABLET | Freq: Three times a day (TID) | ORAL | Status: DC | PRN

## 2016-05-14 NOTE — Telephone Encounter (Signed)
Pt needs new rx hydrocodone °

## 2016-05-14 NOTE — Telephone Encounter (Signed)
Pt notified Rx ready for pickup. Rx printed and signed.  

## 2016-05-19 ENCOUNTER — Emergency Department (HOSPITAL_BASED_OUTPATIENT_CLINIC_OR_DEPARTMENT_OTHER)
Admission: EM | Admit: 2016-05-19 | Discharge: 2016-05-19 | Disposition: A | Discharge status: Home / Self Care | Payer: Commercial Managed Care - HMO | Attending: Emergency Medicine | Admitting: Emergency Medicine

## 2016-05-19 ENCOUNTER — Emergency Department (HOSPITAL_BASED_OUTPATIENT_CLINIC_OR_DEPARTMENT_OTHER): Payer: Commercial Managed Care - HMO

## 2016-05-19 ENCOUNTER — Encounter (HOSPITAL_BASED_OUTPATIENT_CLINIC_OR_DEPARTMENT_OTHER): Payer: Self-pay

## 2016-05-19 DIAGNOSIS — I10 Essential (primary) hypertension: Secondary | ICD-10-CM | POA: Diagnosis not present

## 2016-05-19 DIAGNOSIS — M25521 Pain in right elbow: Secondary | ICD-10-CM | POA: Insufficient documentation

## 2016-05-19 DIAGNOSIS — M79601 Pain in right arm: Secondary | ICD-10-CM | POA: Diagnosis present

## 2016-05-19 DIAGNOSIS — E785 Hyperlipidemia, unspecified: Secondary | ICD-10-CM | POA: Diagnosis not present

## 2016-05-19 DIAGNOSIS — Z8673 Personal history of transient ischemic attack (TIA), and cerebral infarction without residual deficits: Secondary | ICD-10-CM | POA: Insufficient documentation

## 2016-05-19 DIAGNOSIS — M7711 Lateral epicondylitis, right elbow: Secondary | ICD-10-CM

## 2016-05-19 DIAGNOSIS — E119 Type 2 diabetes mellitus without complications: Secondary | ICD-10-CM | POA: Diagnosis not present

## 2016-05-19 DIAGNOSIS — Z7982 Long term (current) use of aspirin: Secondary | ICD-10-CM | POA: Insufficient documentation

## 2016-05-19 DIAGNOSIS — M25531 Pain in right wrist: Secondary | ICD-10-CM | POA: Diagnosis not present

## 2016-05-19 DIAGNOSIS — M7989 Other specified soft tissue disorders: Secondary | ICD-10-CM | POA: Diagnosis not present

## 2016-05-19 DIAGNOSIS — Z7984 Long term (current) use of oral hypoglycemic drugs: Secondary | ICD-10-CM | POA: Insufficient documentation

## 2016-05-19 DIAGNOSIS — Z794 Long term (current) use of insulin: Secondary | ICD-10-CM | POA: Diagnosis not present

## 2016-05-19 LAB — CBC WITH DIFFERENTIAL/PLATELET
Basophils Absolute: 0 10*3/uL (ref 0.0–0.1)
Basophils Relative: 0 %
EOS ABS: 0.1 10*3/uL (ref 0.0–0.7)
EOS PCT: 1 %
HCT: 35.6 % — ABNORMAL LOW (ref 36.0–46.0)
Hemoglobin: 12.1 g/dL (ref 12.0–15.0)
LYMPHS ABS: 2.1 10*3/uL (ref 0.7–4.0)
LYMPHS PCT: 20 %
MCH: 26.9 pg (ref 26.0–34.0)
MCHC: 34 g/dL (ref 30.0–36.0)
MCV: 79.1 fL (ref 78.0–100.0)
MONO ABS: 1.1 10*3/uL — AB (ref 0.1–1.0)
MONOS PCT: 10 %
Neutro Abs: 7.1 10*3/uL (ref 1.7–7.7)
Neutrophils Relative %: 69 %
PLATELETS: 283 10*3/uL (ref 150–400)
RBC: 4.5 MIL/uL (ref 3.87–5.11)
RDW: 14.3 % (ref 11.5–15.5)
WBC: 10.3 10*3/uL (ref 4.0–10.5)

## 2016-05-19 LAB — BASIC METABOLIC PANEL
Anion gap: 10 (ref 5–15)
BUN: 27 mg/dL — AB (ref 6–20)
CALCIUM: 9.2 mg/dL (ref 8.9–10.3)
CO2: 23 mmol/L (ref 22–32)
CREATININE: 2 mg/dL — AB (ref 0.44–1.00)
Chloride: 105 mmol/L (ref 101–111)
GFR calc Af Amer: 29 mL/min — ABNORMAL LOW (ref >60)
GFR, EST NON AFRICAN AMERICAN: 25 mL/min — AB (ref >60)
GLUCOSE: 123 mg/dL — AB (ref 65–99)
POTASSIUM: 3.3 mmol/L — AB (ref 3.5–5.1)
Sodium: 138 mmol/L (ref 135–145)

## 2016-05-19 LAB — SEDIMENTATION RATE: Sed Rate: 47 mm/hr — ABNORMAL HIGH (ref 0–22)

## 2016-05-19 MED ORDER — OXYCODONE-ACETAMINOPHEN 5-325 MG PO TABS
1.0000 | ORAL_TABLET | Freq: Once | ORAL | Status: AC
  Administered 2016-05-19: 1 via ORAL
  Filled 2016-05-19: qty 1

## 2016-05-19 MED ORDER — ONDANSETRON 4 MG PO TBDP
4.0000 mg | ORAL_TABLET | Freq: Once | ORAL | Status: AC
  Administered 2016-05-19: 4 mg via ORAL
  Filled 2016-05-19: qty 1

## 2016-05-19 NOTE — ED Provider Notes (Signed)
CSN: [id number]     Arrival date & time 05/19/16  1117 History   First MD Initiated Contact with Patient 05/19/16 1147     Chief Complaint  Patient presents with  . Arm Pain     (Consider location/radiation/quality/duration/timing/severity/associated sxs/prior Treatment) HPI 65 year old female who presents with right arm pain. She has a history of diabetes, hyperlipidemia, hypertension, chronic kidney disease, and gout. States that for the past 3 days she has had progressively worsening right elbow and wrist pain and swelling. Has not had fevers or chills. Denies any associating trauma. Difficult to move her hands due to pain, but denies any numbness or weakness. Thinks that this is more severe than prior history of gout.    Past Medical History  Diagnosis Date  . Diabetes mellitus   . Hypertension   . Hyperlipidemia   . Anxiety   . Chronic kidney disease   . Peripheral neuropathy ([place])   . Allergy   . Arthritis   . Cataract   . Heart murmur   . Stroke ([place])   . Gout    Past Surgical History  Procedure Laterality Date  . Tubal ligation    . Hemorrhoid surgery    . Neck surgery    . Cataract extraction, bilateral    . Appendectomy    . Eye surgery    . Fracture surgery      L foot  . Spine surgery      cervical spine   Family History  Problem Relation Age of Onset  . Cancer Mother   . Hypertension Mother   . Diabetes Mother   . Heart disease Father    Social History  Substance Use Topics  . Smoking status: Never Smoker   . Smokeless tobacco: Never Used  . Alcohol Use: No   OB History    No data available     Review of Systems 10/14 systems reviewed and are negative other than those stated in the HPI    Allergies  Metoprolol; Cefaclor; and Tetanus toxoid adsorbed  Home Medications   Prior to Admission medications   Medication Sig Start Date End Date Taking? Authorizing Provider  ACCU-CHEK FASTCLIX LANCETS MISC USE TO CHECK BLOOD SUGAR THREE TIMES A  DAY BEFORE MEALS AND AT BEDTIME 02/09/16  Yes Marletta Lor, MD  Alcohol Swabs (B-D SINGLE USE SWABS REGULAR) PADS Use as directed 06/29/14  Yes Marletta Lor, MD  allopurinol (ZYLOPRIM) 300 MG tablet Take 1 tablet (300 mg total) by mouth daily. 03/27/16  Yes Marletta Lor, MD  amLODipine (NORVASC) 10 MG tablet TAKE 1 TABLET EVERY DAY 08/16/15  Yes Marletta Lor, MD  atorvastatin (LIPITOR) 80 MG tablet TAKE 1 TABLET (80 MG TOTAL) BY MOUTH DAILY. 08/11/15  Yes Marletta Lor, MD  Blood Glucose Monitoring Suppl (ACCU-CHEK NANO SMARTVIEW) W/DEVICE KIT USE AS DIRECTED 12/22/14  Yes Marletta Lor, MD  COLCRYS 0.6 MG tablet Take 1 tablet (0.6 mg total) by mouth daily. 04/03/16  Yes Marletta Lor, MD  furosemide (LASIX) 20 MG tablet TAKE 1 TABLET (20 MG TOTAL) BY MOUTH DAILY. 08/22/15  Yes Marletta Lor, MD  glucose blood (ACCU-CHEK SMARTVIEW) test strip USE TO CHECK BLOOD SUGAR THREE TIMES A DAY BEFORE MEALS AND AT BEDTIME 02/09/16  Yes Marletta Lor, MD  HUMALOG KWIKPEN 100 UNIT/ML KiwkPen INJECT 10-16 UNITS INTO THE SKIN 3 (THREE) TIMES DAILY BEFORE MEALS. 04/04/15  Yes Marletta Lor, MD  HYDROcodone-acetaminophen [facility]) 10-325 MG  tablet Take 0.5-1 tablets by mouth every 8 (eight) hours as needed. 05/14/16  Yes Marletta Lor, MD  insulin aspart (NOVOLOG) 100 UNIT/ML FlexPen Inject 15 Units into the skin 3 (three) times daily with meals. 01/23/16  Yes Marletta Lor, MD  insulin glargine (LANTUS) 100 UNIT/ML injection 10 units at bedtime 01/11/15  Yes Marletta Lor, MD  Insulin Pen Needle (NOVOFINE) 30G X 8 MM MISC Inject 1 packet into the skin as needed.     Yes Historical Provider, MD  KLOR-CON/EF 25 MEQ disintegrating tablet TAKE ONE TABLET BY MOUTH IN 6 OUNCES OF WATER DAILY 03/06/15  Yes Marletta Lor, MD  Lancet Devices (PRODIGY LANCING DEVICE) MISC 1 each by Does not apply route once. 06/29/14  Yes Marletta Lor, MD  lidocaine  (LIDODERM) 5 % Place 1 patch onto the skin daily. APPLY 1 PATCH TO THE AFFECTED AREA AND LEAVE IN PLACE FOR 12 HOURS, THEN REMOVE AND LEAVE OFF FOR 12 HOURS 10/04/14  Yes Historical Provider, MD  aspirin 81 MG EC tablet TAKE 1 TABLET BY MOUTH DAILY. SWALLOW WHOLE. 10/31/15   Marletta Lor, MD  LORazepam (ATIVAN) 0.5 MG tablet Take 1 tablet (0.5 mg total) by mouth 2 (two) times daily as needed for anxiety. 01/07/14   Marletta Lor, MD   BP 145/66 mmHg  Pulse 62  Temp(Src) 98 F (36.7 C) (Oral)  Resp 20  Ht _0  (1.651 m)  Wt 168 lb (76.204 kg)  BMI 27.96 kg/m2  SpO2 100% Physical Exam Physical Exam  Nursing note and vitals reviewed. Constitutional: Well developed, well nourished, non-toxic, and in no acute distress Head: Normocephalic and atraumatic.  Mouth/Throat: Oropharynx is clear and moist.  Neck: Normal range of motion. Neck supple.  Cardiovascular: Normal rate and regular rhythm.   Pulmonary/Chest: Effort normal and breath sounds normal.  Abdominal: Soft. There is no tenderness. There is no rebound and no guarding.  Musculoskeletal: There is swelling to the right elbow along the ridge of the lateral epicondyle with reproducible tenderness. Painful extension of elbow active and passively. Pain extension against resistance and passive flexion of the right wrist.  Neurological: Alert, no facial droop, fluent speech, in tact sensation and motor involving unlar, median, axillary, and radial nerves of the RUQ Skin: Skin is warm and dry.  Psychiatric: Cooperative  ED Course  Procedures (including critical care time) Labs Review Labs Reviewed  CBC WITH DIFFERENTIAL/PLATELET - Abnormal; Notable for the following:    HCT 35.6 (*)    Monocytes Absolute 1.1 (*)    All other components within normal limits  BASIC METABOLIC PANEL - Abnormal; Notable for the following:    Potassium 3.3 (*)    Glucose, Bld 123 (*)    BUN 27 (*)    Creatinine, Ser 2.00 (*)    GFR calc non Af  Amer 25 (*)    GFR calc Af Amer 29 (*)    All other components within normal limits  SEDIMENTATION RATE - Abnormal; Notable for the following:    Sed Rate 47 (*)    All other components within normal limits  C-REACTIVE PROTEIN    Imaging Review Dg Elbow Complete Right  05/19/2016  CLINICAL DATA:  Acute right elbow pain for 3 days. No known injury. Initial encounter. EXAM: RIGHT ELBOW - COMPLETE 3+ VIEW COMPARISON:  04/01/2010 FINDINGS: There is no evidence of acute fracture, subluxation or dislocation. Mild soft tissue swelling is present. No joint effusion noted. Degenerative/enthesopathic changes  are again identified. IMPRESSION: Soft tissue swelling without acute bony abnormality or joint effusion. Mild degenerative changes. Electronically Signed   By: Margarette Canada M.D.   On: 05/19/2016 13:04   Dg Wrist Complete Right  05/19/2016  CLINICAL DATA:  Right wrist pain for 3 days. No known injury. Initial encounter. EXAM: RIGHT WRIST - COMPLETE 3+ VIEW COMPARISON:  None. FINDINGS: There is no evidence of fracture or dislocation. There is no evidence of arthropathy or other focal bone abnormality. Soft tissues are unremarkable. IMPRESSION: Negative. Electronically Signed   By: Margarette Canada M.D.   On: 05/19/2016 13:06   I have personally reviewed and evaluated these images and lab results as part of my medical decision-making.   EKG Interpretation None      MDM   Final diagnoses:  Elbow pain, right  Lateral epicondylitis, right    65 year old female with history of gout, diabetes, hypertension and hyperlipidemia who presents with 3 days of right elbow and wrist pain. On presentation she is afebrile, hemodynamically stable and in no acute distress. Does appear uncomfortable with any range of motion of her elbow and wrist. Minimal soft tissue swelling noted of the right elbow, and seems to be localized to the groove of the lateral epicondyles. This area is tender to palpation. There is also pain  primarily with extension and held at baseline and flexion. Xr of elbow without joint effusion but mild soft tissue swelling. Xr wrist without acute issues. State that she was vacuuming heavily prior to onset of symptoms. ? Likely lateral epicondylitis as exam seems consistent with that. No joint effusion  Or joint swelling as swelling seem isolated to lateral epicondyle. No suspecting inflammatory arthritis. She has no leukocytosis but does have some   mildly elevated inflammatory markers , which is nonspecific. At this time discussed some supportive care measures. she is given follow-up with Dr. Barbaraann Barthel for reevaluation and further management. Strict return instructions are reviewed. She expressed understanding of all discharge instructions, and felt comfortable to plan of care.    Forde Dandy, MD 05/19/16 [phone]

## 2016-05-19 NOTE — ED Notes (Signed)
Ace Wrap applied to rt forearm. Instructed pt to use arm sling that she already has at the house. Pt verbalized understanding.

## 2016-05-19 NOTE — Discharge Instructions (Signed)
I suspect that you have inflammation of the tendons of the outer elbow from vacuuming. Take home vicoden for pain and ice and keep elevated at rest. You are given sports medicine/orthopedic surgery follow-up for re-evaluation.  Return for worsening symptoms, including fever, night sweats, worsening swelling and pain or any other symptoms concerning to you.   Tennis Elbow Tennis elbow is puffiness (inflammation) of the outer tendons of your forearm close to your elbow. Your tendons attach your muscles to your bones. Tennis elbow can happen in any sport or job in which you use your elbow too much. It is caused by doing the same motion over and over. Tennis elbow can cause:  Pain and tenderness in your forearm and the outer part of your elbow.  A burning feeling. This runs from your elbow through your arm.  Weak grip in your hands. HOME CARE Activity  Rest your elbow and wrist as told by your doctor. Try to avoid any activities that caused the problem until your doctor says that you can do them again.  If a physical therapist teaches you exercises, do all of them as told.  If you lift an object, lift it with your palm facing up. This is easier on your elbow. Lifestyle  If your tennis elbow is caused by sports, check your equipment and make sure that:  You are using it correctly.  It fits you well.  If your tennis elbow is caused by work, take breaks often, if you are able. Talk with your manager about doing your work in a way that is safe for you.  If your tennis elbow is caused by computer use, talk with your manager about any changes that can be made to your work setup. General Instructions  If told, apply ice to the painful area:  Put ice in a plastic bag.  Place a towel between your skin and the bag.  Leave the ice on for 20 minutes, 2-3 times per day.  Take medicines only as told by your doctor.  If you were given a brace, wear it as told by your doctor.  Keep all  follow-up visits as told by your doctor. This is important. GET HELP IF:  Your pain does not get better with treatment.  Your pain gets worse.  You have weakness in your forearm, hand, or fingers.  You cannot feel your forearm, hand, or fingers.   This information is not intended to replace advice given to you by your health care provider. Make sure you discuss any questions you have with your health care provider.   Document Released: 05/22/2010 Document Revised: 04/18/2015 Document Reviewed: 11/28/2014 Elsevier Interactive Patient Education [employer]2016 [employer] Inc.

## 2016-05-19 NOTE — ED Notes (Addendum)
Pt reports 2-3 day history of right arm pain from elbow down to wrist, area swollen, pt denies history of DVT or recent IV insertions. Pt reports history of gout, has PRN hydrocodone at home but "just didn't take it, I don't like medicine."

## 2016-05-20 LAB — C-REACTIVE PROTEIN: CRP: 4.5 mg/dL — AB (ref <1.0)

## 2016-06-13 DIAGNOSIS — M25562 Pain in left knee: Secondary | ICD-10-CM | POA: Diagnosis not present

## 2016-06-13 DIAGNOSIS — M1712 Unilateral primary osteoarthritis, left knee: Secondary | ICD-10-CM | POA: Diagnosis not present

## 2016-06-14 ENCOUNTER — Other Ambulatory Visit: Payer: Self-pay | Admitting: Internal Medicine

## 2016-06-14 NOTE — Telephone Encounter (Signed)
Last filled 05/14/16., please advise

## 2016-06-14 NOTE — Telephone Encounter (Signed)
Okay to refill? 

## 2016-06-14 NOTE — Telephone Encounter (Signed)
Pt need new Rx for Hydrocodone °

## 2016-06-17 ENCOUNTER — Telehealth: Payer: Self-pay | Admitting: *Deleted

## 2016-06-17 MED ORDER — HYDROCODONE-ACETAMINOPHEN 10-325 MG PO TABS: 0.5000 | ORAL_TABLET | Freq: Three times a day (TID) | ORAL | Status: DC | PRN

## 2016-06-17 NOTE — Telephone Encounter (Signed)
Please fill med. 

## 2016-06-17 NOTE — Telephone Encounter (Signed)
I called the pt and informed her the Rx for Hydrocodone was left at the front desk for her to pick up.

## 2016-06-25 ENCOUNTER — Encounter: Payer: Self-pay | Admitting: Internal Medicine

## 2016-06-25 ENCOUNTER — Ambulatory Visit (INDEPENDENT_AMBULATORY_CARE_PROVIDER_SITE_OTHER): Payer: Commercial Managed Care - HMO | Admitting: Internal Medicine

## 2016-06-25 VITALS — BP 148/76 | HR 64 | Temp 98.1°F | Resp 20 | Ht 65.0 in | Wt 178.0 lb

## 2016-06-25 DIAGNOSIS — I129 Hypertensive chronic kidney disease with stage 1 through stage 4 chronic kidney disease, or unspecified chronic kidney disease: Secondary | ICD-10-CM | POA: Diagnosis not present

## 2016-06-25 DIAGNOSIS — E1122 Type 2 diabetes mellitus with diabetic chronic kidney disease: Secondary | ICD-10-CM | POA: Diagnosis not present

## 2016-06-25 DIAGNOSIS — N189 Chronic kidney disease, unspecified: Secondary | ICD-10-CM | POA: Diagnosis not present

## 2016-06-25 DIAGNOSIS — Z794 Long term (current) use of insulin: Secondary | ICD-10-CM | POA: Diagnosis not present

## 2016-06-25 DIAGNOSIS — I10 Essential (primary) hypertension: Secondary | ICD-10-CM

## 2016-06-25 DIAGNOSIS — E0821 Diabetes mellitus due to underlying condition with diabetic nephropathy: Secondary | ICD-10-CM

## 2016-06-25 DIAGNOSIS — E7800 Pure hypercholesterolemia, unspecified: Secondary | ICD-10-CM

## 2016-06-25 LAB — HEMOGLOBIN A1C: Hgb A1c MFr Bld: 8.2 % — ABNORMAL HIGH (ref 4.6–6.5)

## 2016-06-25 NOTE — Progress Notes (Signed)
Pre visit review using our clinic review tool, if applicable. No additional management support is needed unless otherwise documented below in the visit note. 

## 2016-06-25 NOTE — Progress Notes (Signed)
Subjective:    Patient ID: Sarah [name], female    DOB: [birth year], 65 y.o.   MRN: [id number]  HPI  Lab Results  Component Value Date   HGBA1C 8.4* 03/26/2016    65 year old patient who is seen today for follow-up of diabetes.  This is complicated by renal disease.  She has been compliant with basal insulin and she does take Lantus tender 16 units at bedtime.  She is on mealtime insulin 10-12 units prior to each meal.  She states blood sugars are occasionally less than 70. She has had some left knee pain and did have a cortisone injection 2-3 weeks ago She has essential hypertension. She remains on atorvastatin. No cardiopulmonary complaints.  No focal neurological symptoms.  Past Medical History  Diagnosis Date  . Diabetes mellitus   . Hypertension   . Hyperlipidemia   . Anxiety   . Chronic kidney disease   . Peripheral neuropathy ([place])   . Allergy   . Arthritis   . Cataract   . Heart murmur   . Stroke ([place])   . Gout      Social History   Social History  . Marital Status: Single    Spouse Name: N/A  . Number of Children: N/A  . Years of Education: N/A   Occupational History  . Not on file.   Social History Main Topics  . Smoking status: Never Smoker   . Smokeless tobacco: Never Used  . Alcohol Use: No  . Drug Use: No  . Sexual Activity: Not on file   Other Topics Concern  . Not on file   Social History Narrative    Past Surgical History  Procedure Laterality Date  . Tubal ligation    . Hemorrhoid surgery    . Neck surgery    . Cataract extraction, bilateral    . Appendectomy    . Eye surgery    . Fracture surgery      L foot  . Spine surgery      cervical spine    Family History  Problem Relation Age of Onset  . Cancer Mother   . Hypertension Mother   . Diabetes Mother   . Heart disease Father     Allergies  Allergen Reactions  . Metoprolol Swelling  . Cefaclor Hives  . Tetanus Toxoid Adsorbed Swelling    Current Outpatient  Prescriptions on File Prior to Visit  Medication Sig Dispense Refill  . ACCU-CHEK FASTCLIX LANCETS MISC USE TO CHECK BLOOD SUGAR THREE TIMES A DAY BEFORE MEALS AND AT BEDTIME 102 each 5  . Alcohol Swabs (B-D SINGLE USE SWABS REGULAR) PADS Use as directed 100 each 4  . allopurinol (ZYLOPRIM) 300 MG tablet Take 1 tablet (300 mg total) by mouth daily. 90 tablet 3  . amLODipine (NORVASC) 10 MG tablet TAKE 1 TABLET EVERY DAY 90 tablet 3  . aspirin 81 MG EC tablet TAKE 1 TABLET BY MOUTH DAILY. SWALLOW WHOLE. 90 tablet 3  . atorvastatin (LIPITOR) 80 MG tablet TAKE 1 TABLET (80 MG TOTAL) BY MOUTH DAILY. 90 tablet 1  . Blood Glucose Monitoring Suppl (ACCU-CHEK NANO SMARTVIEW) W/DEVICE KIT USE AS DIRECTED 1 kit 0  . COLCRYS 0.6 MG tablet Take 1 tablet (0.6 mg total) by mouth daily. 10 tablet 5  . furosemide (LASIX) 20 MG tablet TAKE 1 TABLET (20 MG TOTAL) BY MOUTH DAILY. 30 tablet 5  . glucose blood (ACCU-CHEK SMARTVIEW) test strip USE TO CHECK BLOOD SUGAR THREE TIMES  A DAY BEFORE MEALS AND AT BEDTIME 400 each 3  . HUMALOG KWIKPEN 100 UNIT/ML KiwkPen INJECT 10-16 UNITS INTO THE SKIN 3 (THREE) TIMES DAILY BEFORE MEALS. 15 pen 2  . HYDROcodone-acetaminophen (NORCO) 10-325 MG tablet Take 0.5-1 tablets by mouth every 8 (eight) hours as needed. 90 tablet 0  . insulin aspart (NOVOLOG) 100 UNIT/ML FlexPen Inject 15 Units into the skin 3 (three) times daily with meals. 15 pen 3  . insulin glargine (LANTUS) 100 UNIT/ML injection 10 units at bedtime 15 mL 6  . Insulin Pen Needle (NOVOFINE) 30G X 8 MM MISC Inject 1 packet into the skin as needed.      Marland Kitchen KLOR-CON/EF 25 MEQ disintegrating tablet TAKE ONE TABLET BY MOUTH IN 6 OUNCES OF WATER DAILY 90 tablet 1  . Lancet Devices (PRODIGY LANCING DEVICE) MISC 1 each by Does not apply route once. 1 each 1  . lidocaine (LIDODERM) 5 % Place 1 patch onto the skin daily. APPLY 1 PATCH TO THE AFFECTED AREA AND LEAVE IN PLACE FOR 12 HOURS, THEN REMOVE AND LEAVE OFF FOR 12 HOURS      . LORazepam (ATIVAN) 0.5 MG tablet Take 1 tablet (0.5 mg total) by mouth 2 (two) times daily as needed for anxiety. 180 tablet 1   No current facility-administered medications on file prior to visit.    BP 148/76 mmHg  Pulse 64  Temp(Src) 98.1 F (36.7 C) (Oral)  Resp 20  Ht '5\' 5"'  (1.651 m)  Wt 178 lb (80.74 kg)  BMI 29.62 kg/m2     Review of Systems  Constitutional: Negative.   HENT: Negative for congestion, dental problem, hearing loss, rhinorrhea, sinus pressure, sore throat and tinnitus.   Eyes: Negative for pain, discharge and visual disturbance.  Respiratory: Negative for cough and shortness of breath.   Cardiovascular: Negative for chest pain, palpitations and leg swelling.  Gastrointestinal: Negative for nausea, vomiting, abdominal pain, diarrhea, constipation, blood in stool and abdominal distention.  Genitourinary: Negative for dysuria, urgency, frequency, hematuria, flank pain, vaginal bleeding, vaginal discharge, difficulty urinating, vaginal pain and pelvic pain.  Musculoskeletal: Negative for joint swelling, arthralgias and gait problem.       Left knee pain and swelling  Skin: Negative for rash.  Neurological: Negative for dizziness, syncope, speech difficulty, weakness, numbness and headaches.  Hematological: Negative for adenopathy.  Psychiatric/Behavioral: Negative for behavioral problems, dysphoric mood and agitation. The patient is not nervous/anxious.        Objective:   Physical Exam  Constitutional: She is oriented to person, place, and time. She appears well-developed and well-nourished.  blood pressure 134/80  HENT:  Head: Normocephalic.  Right Ear: External ear normal.  Left Ear: External ear normal.  Mouth/Throat: Oropharynx is clear and moist.  Eyes: Conjunctivae and EOM are normal. Pupils are equal, round, and reactive to light.  Neck: Normal range of motion. Neck supple. No thyromegaly present.  Cardiovascular: Normal rate, regular rhythm  and intact distal pulses.   Murmur heard. Grade 3/6 systolic murmur with radiation to the carotids Pedal pulses faint, except for a right full dorsalis pedis pulse  Pulmonary/Chest: Effort normal and breath sounds normal.  Abdominal: Soft. Bowel sounds are normal. She exhibits no mass. There is no tenderness.  Musculoskeletal: Normal range of motion.  Lymphadenopathy:    She has no cervical adenopathy.  Neurological: She is alert and oriented to person, place, and time.  Skin: Skin is warm and dry. No rash noted.  Psychiatric: She has a normal  mood and affect. Her behavior is normal.          Assessment & Plan:   Diabetes mellitus.  Patient has been more compliant with her insulin.  We'll check hemoglobin A1c Chronic kidney disease Essential hypertension, stable Dyslipidemia.  Continue statin therapy  Patient has had a recent eye examination  Follow-up 3 months  Nyoka Cowden, MD

## 2016-06-25 NOTE — Patient Instructions (Signed)
Limit your sodium (Salt) intake   Please check your hemoglobin A1c every 3 months    It is important that you exercise regularly, at least 20 minutes 3 to 4 times per week.  If you develop chest pain or shortness of breath seek  medical attention.  You need to lose weight.  Consider a lower calorie diet and regular exercise. 

## 2016-07-02 ENCOUNTER — Telehealth: Payer: Self-pay | Admitting: Internal Medicine

## 2016-07-02 NOTE — Telephone Encounter (Signed)
Notify patient of the furosemide is a medicine used to control the swelling.  Have patient increase to 2 tabs daily for 3 days. Stress low-salt diet  Office visit later this week.  If unimproved to rule out DVT, etc.

## 2016-07-02 NOTE — Telephone Encounter (Signed)
Called & informed pt to take 2 tablets for 3 days, and pt is scheduled to come in on Monday @ 10am.

## 2016-07-02 NOTE — Telephone Encounter (Signed)
Pt would like to know if her furosemide (LASIX) is what causing her L foot and ankle to swell.   Pt would like to see if you can change the Rx.

## 2016-07-08 ENCOUNTER — Ambulatory Visit (INDEPENDENT_AMBULATORY_CARE_PROVIDER_SITE_OTHER): Payer: Commercial Managed Care - HMO | Admitting: Internal Medicine

## 2016-07-08 ENCOUNTER — Encounter: Payer: Self-pay | Admitting: Internal Medicine

## 2016-07-08 VITALS — BP 140/70 | HR 65 | Temp 97.8°F | Ht 65.0 in | Wt 179.0 lb

## 2016-07-08 DIAGNOSIS — Z794 Long term (current) use of insulin: Secondary | ICD-10-CM

## 2016-07-08 DIAGNOSIS — E0821 Diabetes mellitus due to underlying condition with diabetic nephropathy: Secondary | ICD-10-CM | POA: Diagnosis not present

## 2016-07-08 DIAGNOSIS — I10 Essential (primary) hypertension: Secondary | ICD-10-CM

## 2016-07-08 MED ORDER — INSULIN LISPRO 100 UNIT/ML (KWIKPEN): 18.0000 [IU] | PEN_INJECTOR | Freq: Three times a day (TID) | SUBCUTANEOUS | 11 refills | Status: DC

## 2016-07-08 MED ORDER — BUMETANIDE 0.5 MG PO TABS: 0.5000 mg | ORAL_TABLET | Freq: Every day | ORAL | Status: DC

## 2016-07-08 NOTE — Patient Instructions (Signed)
Limit your sodium (Salt) intake   Please check your hemoglobin A1c every 3 months    It is important that you exercise regularly, at least 20 minutes 3 to 4 times per week.  If you develop chest pain or shortness of breath seek  medical attention.   

## 2016-07-08 NOTE — Progress Notes (Signed)
Pre visit review using our clinic review tool, if applicable. No additional management support is needed unless otherwise documented below in the visit note. 

## 2016-07-08 NOTE — Progress Notes (Signed)
Subjective:    Patient ID: Sarah [name], female    DOB: November 18, 1951, 65 y.o.   MRN: [id number]  HPI  65 year old patient who was seen 2 weeks ago.  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  Hemoglobin A1c was 8.2.  Mealtime insulin was increased to 18 units.  She states that presently she is only using 16 units prior to each meal.  Fasting blood sugars remain recently controlled Her chief complaint today is a pruritic rash.  She describes scattered erythematous papular lesions that she attributes to furosemide.  She has been on furosemide for some time but recently has increased the dose due to lower extremity edema  Past Medical History:  Diagnosis Date  . Allergy   . Anxiety   . Arthritis   . Cataract   . Chronic kidney disease   . Diabetes mellitus   . Gout   . Heart murmur   . Hyperlipidemia   . Hypertension   . Peripheral neuropathy ([place])   . Stroke [facility])      Social History   Social History  . Marital status: Single    Spouse name: N/A  . Number of children: N/A  . Years of education: N/A   Occupational History  . Not on file.   Social History Main Topics  . Smoking status: Never Smoker  . Smokeless tobacco: Never Used  . Alcohol use No  . Drug use: No  . Sexual activity: Not on file   Other Topics Concern  . Not on file   Social History Narrative  . No narrative on file    Past Surgical History:  Procedure Laterality Date  . APPENDECTOMY    . CATARACT EXTRACTION, BILATERAL    . EYE SURGERY    . FRACTURE SURGERY     L foot  . HEMORRHOID SURGERY    . NECK SURGERY    . SPINE SURGERY     cervical spine  . TUBAL LIGATION      Family History  Problem Relation Age of Onset  . Cancer Mother   . Hypertension Mother   . Diabetes Mother   . Heart disease Father     Allergies  Allergen Reactions  . Metoprolol Swelling  . Cefaclor Hives  . Tetanus Toxoid Adsorbed Swelling    Current Outpatient Prescriptions on File Prior to Visit    Medication Sig Dispense Refill  . ACCU-CHEK FASTCLIX LANCETS MISC USE TO CHECK BLOOD SUGAR THREE TIMES A DAY BEFORE MEALS AND AT BEDTIME 102 each 5  . Alcohol Swabs (B-D SINGLE USE SWABS REGULAR) PADS Use as directed 100 each 4  . allopurinol (ZYLOPRIM) 300 MG tablet Take 1 tablet (300 mg total) by mouth daily. 90 tablet 3  . amLODipine (NORVASC) 10 MG tablet TAKE 1 TABLET EVERY DAY 90 tablet 3  . aspirin 81 MG EC tablet TAKE 1 TABLET BY MOUTH DAILY. SWALLOW WHOLE. 90 tablet 3  . atorvastatin (LIPITOR) 80 MG tablet TAKE 1 TABLET (80 MG TOTAL) BY MOUTH DAILY. 90 tablet 1  . Blood Glucose Monitoring Suppl (ACCU-CHEK NANO SMARTVIEW) W/DEVICE KIT USE AS DIRECTED 1 kit 0  . COLCRYS 0.6 MG tablet Take 1 tablet (0.6 mg total) by mouth daily. 10 tablet 5  . furosemide (LASIX) 20 MG tablet TAKE 1 TABLET (20 MG TOTAL) BY MOUTH DAILY. 30 tablet 5  . glucose blood (ACCU-CHEK SMARTVIEW) test strip USE TO CHECK BLOOD SUGAR THREE TIMES A DAY BEFORE MEALS AND AT BEDTIME  400 each 3  . HUMALOG KWIKPEN 100 UNIT/ML KiwkPen INJECT 10-16 UNITS INTO THE SKIN 3 (THREE) TIMES DAILY BEFORE MEALS. 15 pen 2  . HYDROcodone-acetaminophen (NORCO) 10-325 MG tablet Take 0.5-1 tablets by mouth every 8 (eight) hours as needed. 90 tablet 0  . insulin aspart (NOVOLOG) 100 UNIT/ML FlexPen Inject 15 Units into the skin 3 (three) times daily with meals. 15 pen 3  . insulin glargine (LANTUS) 100 UNIT/ML injection 10 units at bedtime 15 mL 6  . Insulin Pen Needle (NOVOFINE) 30G X 8 MM MISC Inject 1 packet into the skin as needed.      Marland Kitchen KLOR-CON/EF 25 MEQ disintegrating tablet TAKE ONE TABLET BY MOUTH IN 6 OUNCES OF WATER DAILY 90 tablet 1  . Lancet Devices (PRODIGY LANCING DEVICE) MISC 1 each by Does not apply route once. 1 each 1  . lidocaine (LIDODERM) 5 % Place 1 patch onto the skin daily. APPLY 1 PATCH TO THE AFFECTED AREA AND LEAVE IN PLACE FOR 12 HOURS, THEN REMOVE AND LEAVE OFF FOR 12 HOURS    . LORazepam (ATIVAN) 0.5 MG tablet  Take 1 tablet (0.5 mg total) by mouth 2 (two) times daily as needed for anxiety. 180 tablet 1   No current facility-administered medications on file prior to visit.     BP 140/70   Pulse 65   Temp 97.8 F (36.6 C) (Oral)   Ht _0  (1.651 m)   Wt 179 lb (81.2 kg)   SpO2 98%   BMI 29.79 kg/m     Review of Systems  Constitutional: Negative.   HENT: Negative for congestion, dental problem, hearing loss, rhinorrhea, sinus pressure, sore throat and tinnitus.   Eyes: Negative for pain, discharge and visual disturbance.  Respiratory: Negative for cough and shortness of breath.   Cardiovascular: Positive for leg swelling. Negative for chest pain and palpitations.  Gastrointestinal: Negative for abdominal distention, abdominal pain, blood in stool, constipation, diarrhea, nausea and vomiting.  Genitourinary: Negative for difficulty urinating, dysuria, flank pain, frequency, hematuria, pelvic pain, urgency, vaginal bleeding, vaginal discharge and vaginal pain.  Musculoskeletal: Positive for arthralgias and joint swelling. Negative for gait problem.       Left knee pain  Skin: Negative for rash.  Neurological: Negative for dizziness, syncope, speech difficulty, weakness, numbness and headaches.  Hematological: Negative for adenopathy.  Psychiatric/Behavioral: Negative for agitation, behavioral problems and dysphoric mood. The patient is not nervous/anxious.        Objective:   Physical Exam  Constitutional: She is oriented to person, place, and time. She appears well-developed and well-nourished.  HENT:  Head: Normocephalic.  Right Ear: External ear normal.  Left Ear: External ear normal.  Mouth/Throat: Oropharynx is clear and moist.  Eyes: Conjunctivae and EOM are normal. Pupils are equal, round, and reactive to light.  Neck: Normal range of motion. Neck supple. No thyromegaly present.  Cardiovascular: Normal rate, regular rhythm and intact distal pulses.   Murmur  heard. Pulmonary/Chest: Effort normal and breath sounds normal. She has no rales.  Abdominal: Soft. Bowel sounds are normal. She exhibits no mass. There is no tenderness.  Musculoskeletal: Normal range of motion. She exhibits edema.  Plus 1 ankle edema  Lymphadenopathy:    She has no cervical adenopathy.  Neurological: She is alert and oriented to person, place, and time.  Skin: Skin is warm and dry. No rash noted.  Psychiatric: She has a normal mood and affect. Her behavior is normal.  Assessment & Plan:   Pedal edema.  Will discontinue furosemide.  Patient feels this has caused some allergic issues.  Will substitute Bumex Essential hypertension Chronic kidney disease Diabetes.  Compliance with her insulin again discussed.  She was encouraged to take 18 units prior to each meal  Nyoka Cowden, MD

## 2016-07-18 ENCOUNTER — Other Ambulatory Visit: Payer: Self-pay | Admitting: Internal Medicine

## 2016-07-23 ENCOUNTER — Telehealth: Payer: Self-pay | Admitting: Internal Medicine

## 2016-07-23 ENCOUNTER — Other Ambulatory Visit: Payer: Self-pay | Admitting: *Deleted

## 2016-07-23 DIAGNOSIS — M25561 Pain in right knee: Secondary | ICD-10-CM | POA: Diagnosis not present

## 2016-07-23 MED ORDER — HYDROCODONE-ACETAMINOPHEN 10-325 MG PO TABS: 0.5000 | ORAL_TABLET | Freq: Three times a day (TID) | ORAL | 0 refills | Status: DC | PRN

## 2016-07-23 NOTE — Telephone Encounter (Signed)
Rx printed and in patient pick up. Patient notified.

## 2016-07-23 NOTE — Telephone Encounter (Signed)
Pt need new Rx for Hydrocodone °

## 2016-07-23 NOTE — Telephone Encounter (Signed)
Okay to refill if on time

## 2016-08-04 ENCOUNTER — Other Ambulatory Visit: Payer: Self-pay | Admitting: Internal Medicine

## 2016-08-06 ENCOUNTER — Telehealth: Payer: Self-pay | Admitting: Internal Medicine

## 2016-08-06 MED ORDER — POTASSIUM CHLORIDE 20 MEQ PO PACK: 20.0000 meq | PACK | Freq: Every day | ORAL | 0 refills | Status: DC

## 2016-08-06 NOTE — Telephone Encounter (Signed)
[id number] [place] - [address] [phone] (Phone) [phone] (Fax)    KLOR-CON/EF 25 MEQ disintegrating tablet    EFFERVESENT TABLET  They need a new Rx for this prescription because the  insurance won't pay for this  She can't take the tablets because she can't swallow and if she chews them she vomits.  He ran a test claim for the liquid form and even with insurance it is $249   The pharmacist is stomped on what to do next.

## 2016-08-06 NOTE — Telephone Encounter (Signed)
Do these packets work? Please call pharmacy and check on cost

## 2016-08-06 NOTE — Telephone Encounter (Signed)
This is the message I was referring to.

## 2016-08-06 NOTE — Telephone Encounter (Signed)
Please Advise

## 2016-08-07 MED ORDER — POTASSIUM CHLORIDE CRYS ER 20 MEQ PO TBCR: 20.0000 meq | EXTENDED_RELEASE_TABLET | Freq: Every day | ORAL | 3 refills | Status: DC

## 2016-08-07 NOTE — Telephone Encounter (Signed)
Spoke to pt, told her pharmacy will have her Rx for Potassium ready in a little bit. Rx was changed to K-Dur so you can put the tablet in a glass of water and it will dissolve and then drink it. Pt verbalized understanding.

## 2016-08-07 NOTE — Telephone Encounter (Signed)
Called pharmacy and spoke to LeboPhil, asked him if Potassium packets went through for pt? Phil said no, but can try K-Dur which pt can put in glass of water and let it dissolve so she is not chewing them. Told him I will check with provider to see if I can change to K-Dur and get back to him. Phil verbalized understanding.   Discussed pt's situation with Shirline Freesory Nafziger NP and what pharmacist recommended. Kandee KeenCory said okay to order K-Dur 20 meq ER one tablet daily for pt. Rx sent to the pharmacy. Called pharmacy and verified with Phil that correct Rx was sent. Phil said yes.

## 2016-08-07 NOTE — Telephone Encounter (Signed)
[facility] and spoke with Ebony Cargolayton (pharmacist). Ebony CargoClayton states that he believes his assistant ran the packets and insurance will cover it, If there are any more questions or concerns he will contact the office.

## 2016-08-23 ENCOUNTER — Telehealth: Payer: Self-pay | Admitting: Internal Medicine

## 2016-08-23 NOTE — Telephone Encounter (Signed)
° ° °  Pt request refill of the following: ° ° °HYDROcodone-acetaminophen (NORCO) 10-325 MG tablet ° ° °Phamacy: °

## 2016-08-24 ENCOUNTER — Other Ambulatory Visit: Payer: Self-pay | Admitting: Internal Medicine

## 2016-08-26 MED ORDER — HYDROCODONE-ACETAMINOPHEN 10-325 MG PO TABS: 0.5000 | ORAL_TABLET | Freq: Three times a day (TID) | ORAL | 0 refills | Status: DC | PRN

## 2016-08-26 NOTE — Telephone Encounter (Signed)
Pt notified Rx ready for pickup. Rx printed and signed.  

## 2016-09-05 ENCOUNTER — Other Ambulatory Visit: Payer: Self-pay | Admitting: Internal Medicine

## 2016-09-20 ENCOUNTER — Other Ambulatory Visit: Payer: Self-pay | Admitting: Internal Medicine

## 2016-09-20 NOTE — Telephone Encounter (Signed)
Rx amlodipine sent to pharmacy. Last ov was 06/25/2016

## 2016-09-25 ENCOUNTER — Other Ambulatory Visit: Payer: Self-pay | Admitting: Internal Medicine

## 2016-09-30 ENCOUNTER — Ambulatory Visit (INDEPENDENT_AMBULATORY_CARE_PROVIDER_SITE_OTHER): Payer: Commercial Managed Care - HMO | Admitting: Internal Medicine

## 2016-09-30 ENCOUNTER — Encounter: Payer: Self-pay | Admitting: Internal Medicine

## 2016-09-30 ENCOUNTER — Telehealth: Payer: Self-pay | Admitting: *Deleted

## 2016-09-30 VITALS — BP 170/88 | HR 74 | Temp 97.9°F | Ht 65.0 in | Wt 181.0 lb

## 2016-09-30 DIAGNOSIS — Z23 Encounter for immunization: Secondary | ICD-10-CM | POA: Diagnosis not present

## 2016-09-30 DIAGNOSIS — N189 Chronic kidney disease, unspecified: Secondary | ICD-10-CM

## 2016-09-30 DIAGNOSIS — Z794 Long term (current) use of insulin: Secondary | ICD-10-CM

## 2016-09-30 DIAGNOSIS — I129 Hypertensive chronic kidney disease with stage 1 through stage 4 chronic kidney disease, or unspecified chronic kidney disease: Secondary | ICD-10-CM | POA: Diagnosis not present

## 2016-09-30 DIAGNOSIS — E1122 Type 2 diabetes mellitus with diabetic chronic kidney disease: Secondary | ICD-10-CM

## 2016-09-30 DIAGNOSIS — E0821 Diabetes mellitus due to underlying condition with diabetic nephropathy: Secondary | ICD-10-CM

## 2016-09-30 DIAGNOSIS — I10 Essential (primary) hypertension: Secondary | ICD-10-CM | POA: Diagnosis not present

## 2016-09-30 LAB — BASIC METABOLIC PANEL
BUN: 31 mg/dL — ABNORMAL HIGH (ref 6–23)
CALCIUM: 9.6 mg/dL (ref 8.4–10.5)
CO2: 27 meq/L (ref 19–32)
CREATININE: 2.39 mg/dL — AB (ref 0.40–1.20)
Chloride: 105 mEq/L (ref 96–112)
GFR: 26.09 mL/min — ABNORMAL LOW (ref >60.00)
GLUCOSE: 73 mg/dL (ref 70–99)
Potassium: 4.1 mEq/L (ref 3.5–5.1)
SODIUM: 142 meq/L (ref 135–145)

## 2016-09-30 LAB — HEMOGLOBIN A1C: Hgb A1c MFr Bld: 8 % — ABNORMAL HIGH (ref 4.6–6.5)

## 2016-09-30 MED ORDER — INSULIN GLARGINE 100 UNIT/ML ~~LOC~~ SOLN: SUBCUTANEOUS | 6 refills | Status: DC

## 2016-09-30 MED ORDER — HYDROCODONE-ACETAMINOPHEN 10-325 MG PO TABS: 0.5000 | ORAL_TABLET | Freq: Three times a day (TID) | ORAL | 0 refills | Status: DC | PRN

## 2016-09-30 NOTE — Telephone Encounter (Signed)
Verbal order received for patient to receive sample medication. Patient teaching completed by provider.   Medication (including dosage): Humalog Kwikpen 100 u/ml, give 18 units three times a day.  Lot #: [id number] Expiration: 12/2018  Qty: 1 pen

## 2016-09-30 NOTE — Patient Instructions (Addendum)
Limit your sodium (Salt) intake    It is important that you exercise regularly, at least 20 minutes 3 to 4 times per week.  If you develop chest pain or shortness of breath seek  medical attention.   Please check your hemoglobin A1c every 3 months  Do not use meloxicam or other anti-inflammatory medications

## 2016-09-30 NOTE — Progress Notes (Signed)
Pre visit review using our clinic review tool, if applicable. No additional management support is needed unless otherwise documented below in the visit note. 

## 2016-09-30 NOTE — Progress Notes (Signed)
Subjective:    Patient ID: Sarah [name], female    DOB: 1951-05-25, 65 y.o.   MRN: [id number]  HPI   65 year old patient who is seen today for follow-up of diabetes,  complicated by chronic kidney disease.   She has been given a prescription for meloxicam by orthopedics.    The patient is aware not to take anti-inflammatory medications.    Presently she is on 15 units of basal insulin and 18 units prior to each meal.    She states fasting blood sugar this morning was 101 and later in the morning dropped to 80.  Her lowest blood sugar over the past few months 56.  She states that she has had 4-5 episodes where she feels mild jitteriness, but no severe hypoglycemic symptoms  .  On August 8.  She did receive a cortisone injection for knee pain.  Past Medical History:  Diagnosis Date  . Allergy   . Anxiety   . Arthritis   . Cataract   . Chronic kidney disease   . Diabetes mellitus   . Gout   . Heart murmur   . Hyperlipidemia   . Hypertension   . Peripheral neuropathy ([place])   . Stroke [facility])      Social History   Social History  . Marital status: Single    Spouse name: N/A  . Number of children: N/A  . Years of education: N/A   Occupational History  . Not on file.   Social History Main Topics  . Smoking status: Never Smoker  . Smokeless tobacco: Never Used  . Alcohol use No  . Drug use: No  . Sexual activity: Not on file   Other Topics Concern  . Not on file   Social History Narrative  . No narrative on file    Past Surgical History:  Procedure Laterality Date  . APPENDECTOMY    . CATARACT EXTRACTION, BILATERAL    . EYE SURGERY    . FRACTURE SURGERY     L foot  . HEMORRHOID SURGERY    . NECK SURGERY    . SPINE SURGERY     cervical spine  . TUBAL LIGATION      Family History  Problem Relation Age of Onset  . Cancer Mother   . Hypertension Mother   . Diabetes Mother   . Heart disease Father     Allergies  Allergen Reactions  . Metoprolol  Swelling  . Cefaclor Hives  . Tetanus Toxoid Adsorbed Swelling    Current Outpatient Prescriptions on File Prior to Visit  Medication Sig Dispense Refill  . ACCU-CHEK FASTCLIX LANCETS MISC USE TO CHECK BLOOD SUGAR THREE TIMES A DAY BEFORE MEALS AND AT BEDTIME 306 each 0  . Alcohol Swabs (B-D SINGLE USE SWABS REGULAR) PADS Use as directed 100 each 4  . allopurinol (ZYLOPRIM) 300 MG tablet Take 1 tablet (300 mg total) by mouth daily. 90 tablet 3  . amLODipine (NORVASC) 10 MG tablet TAKE 1 TABLET EVERY DAY 90 tablet 3  . aspirin 81 MG EC tablet TAKE 1 TABLET BY MOUTH DAILY. SWALLOW WHOLE. 90 tablet 3  . atorvastatin (LIPITOR) 80 MG tablet TAKE 1 TABLET (80 MG TOTAL) BY MOUTH DAILY. 90 tablet 1  . Blood Glucose Monitoring Suppl (ACCU-CHEK NANO SMARTVIEW) W/DEVICE KIT USE AS DIRECTED 1 kit 0  . COLCRYS 0.6 MG tablet Take 1 tablet (0.6 mg total) by mouth daily. 10 tablet 5  . cyclobenzaprine (FLEXERIL) 5 MG tablet TAKE  1 TABLET (5 MG TOTAL) BY MOUTH 3 (THREE) TIMES DAILY AS NEEDED FOR MUSCLE SPASMS. 90 tablet 1  . glucose blood (ACCU-CHEK SMARTVIEW) test strip USE TO CHECK BLOOD SUGAR THREE TIMES A DAY BEFORE MEALS AND AT BEDTIME 400 each 3  . insulin glargine (LANTUS) 100 UNIT/ML injection 10 units at bedtime 15 mL 6  . insulin lispro (HUMALOG) 100 UNIT/ML KiwkPen Inject 0.18 mLs (18 Units total) into the skin 3 (three) times daily. 15 mL 11  . Insulin Pen Needle (NOVOFINE) 30G X 8 MM MISC Inject 1 packet into the skin as needed.      Elmore Guise Devices (PRODIGY LANCING DEVICE) MISC 1 each by Does not apply route once. 1 each 1  . lidocaine (LIDODERM) 5 % Place 1 patch onto the skin daily. APPLY 1 PATCH TO THE AFFECTED AREA AND LEAVE IN PLACE FOR 12 HOURS, THEN REMOVE AND LEAVE OFF FOR 12 HOURS    . LORazepam (ATIVAN) 0.5 MG tablet Take 1 tablet (0.5 mg total) by mouth 2 (two) times daily as needed for anxiety. 180 tablet 1  . NOVOLOG FLEXPEN 100 UNIT/ML FlexPen INJECT 15 UNITS INTO THE SKIN 3  (THREE) TIMES DAILY WITH MEALS. 15 pen 1  . potassium chloride SA (K-DUR,KLOR-CON) 20 MEQ tablet Take 1 tablet (20 mEq total) by mouth daily. 90 tablet 3   Current Facility-Administered Medications on File Prior to Visit  Medication Dose Route Frequency Provider Last Rate Last Dose  . bumetanide (BUMEX) tablet 0.5 mg  0.5 mg Oral Daily Marletta Lor, MD        BP (!) 170/88 (BP Location: Left Arm, Patient Position: Sitting, Cuff Size: Normal)   Pulse 74   Temp 97.9 F (36.6 C) (Oral)   Ht 5' 5" (1.651 m)   Wt 181 lb (82.1 kg)   SpO2 99%   BMI 30.12 kg/m      Review of Systems  Constitutional: Negative.   HENT: Negative for congestion, dental problem, hearing loss, rhinorrhea, sinus pressure, sore throat and tinnitus.   Eyes: Negative for pain, discharge and visual disturbance.  Respiratory: Negative for cough and shortness of breath.   Cardiovascular: Negative for chest pain, palpitations and leg swelling.  Gastrointestinal: Negative for abdominal distention, abdominal pain, blood in stool, constipation, diarrhea, nausea and vomiting.  Genitourinary: Negative for difficulty urinating, dysuria, flank pain, frequency, hematuria, pelvic pain, urgency, vaginal bleeding, vaginal discharge and vaginal pain.  Musculoskeletal: Positive for arthralgias and back pain. Negative for gait problem and joint swelling.  Skin: Negative for rash.  Neurological: Negative for dizziness, syncope, speech difficulty, weakness, numbness and headaches.  Hematological: Negative for adenopathy.  Psychiatric/Behavioral: Negative for agitation, behavioral problems and dysphoric mood. The patient is nervous/anxious.        Objective:   Physical Exam  Constitutional: She appears well-developed and well-nourished. No distress.  Repeat blood pressure 160/72  Musculoskeletal:  Left knee and a Ace bandage    BP Readings from Last 3 Encounters:  09/30/16 (!) 170/88  07/08/16 140/70  06/25/16 (!)  148/76        Assessment & Plan:    .  Diabetes mellitus.  Will check a hemoglobin A1c.  The patient has been resistant to attempts for endocrine referral .  Chronic kidney disease.  Again reinforced that anti-inflammatory medications need to be avoided .  Hypertension.  Suboptimal control.  Will stress.  Low-salt diet and continued to observe.  Patient has had issues with metoprolol will consider rechallenge  next visit  .  Recheck 3 months .  Vaccines administered  Nyoka Cowden

## 2016-10-03 ENCOUNTER — Other Ambulatory Visit: Payer: Self-pay | Admitting: Internal Medicine

## 2016-10-30 ENCOUNTER — Telehealth: Payer: Self-pay | Admitting: Internal Medicine

## 2016-10-30 NOTE — Telephone Encounter (Signed)
° ° °  Pt request refill of the following: ° ° °HYDROcodone-acetaminophen (NORCO) 10-325 MG tablet ° ° °Phamacy: °

## 2016-10-31 MED ORDER — HYDROCODONE-ACETAMINOPHEN 10-325 MG PO TABS: 0.5000 | ORAL_TABLET | Freq: Three times a day (TID) | ORAL | 0 refills | Status: DC | PRN

## 2016-10-31 NOTE — Telephone Encounter (Signed)
Pt notified Rx ready for pickup. Rx printed and signed by Dr. Fry.  

## 2016-11-04 ENCOUNTER — Telehealth: Payer: Self-pay | Admitting: Internal Medicine

## 2016-11-04 MED ORDER — INSULIN LISPRO 100 UNIT/ML (KWIKPEN): 15.0000 [IU] | PEN_INJECTOR | Freq: Three times a day (TID) | SUBCUTANEOUS | 0 refills | Status: DC

## 2016-11-04 NOTE — Telephone Encounter (Signed)
Called pt and told her we have her Rx ready to pick up with coupon for Humalog insulin which is interchangable with Novalog. Pt verbalized understanding.

## 2016-11-04 NOTE — Telephone Encounter (Signed)
° ° °  Pt call to ask if you have a sample of the following   NOVOLOG FLEXPEN 100 UNIT/ML FlexPen   Pt said she is in the doughnut hole and it will cost  400.00

## 2016-11-26 DIAGNOSIS — M1712 Unilateral primary osteoarthritis, left knee: Secondary | ICD-10-CM | POA: Diagnosis not present

## 2016-11-27 ENCOUNTER — Telehealth: Payer: Self-pay | Admitting: Internal Medicine

## 2016-11-27 NOTE — Telephone Encounter (Signed)
°  Pt would like to pick this RX up on Friday 11/29/16   Pt request refill of the following:  HYDROcodone-acetaminophen (NORCO) 10-325 MG tablet   Phamacy:

## 2016-11-29 ENCOUNTER — Other Ambulatory Visit: Payer: Self-pay

## 2016-11-29 MED ORDER — HYDROCODONE-ACETAMINOPHEN 10-325 MG PO TABS: 0.5000 | ORAL_TABLET | Freq: Three times a day (TID) | ORAL | 0 refills | Status: DC | PRN

## 2016-11-29 NOTE — Telephone Encounter (Signed)
Done

## 2016-11-29 NOTE — Telephone Encounter (Signed)
Okay to refill? 

## 2016-11-29 NOTE — Telephone Encounter (Signed)
Pt following up on refill request

## 2016-12-02 ENCOUNTER — Other Ambulatory Visit: Payer: Self-pay | Admitting: Internal Medicine

## 2016-12-04 DIAGNOSIS — E113553 Type 2 diabetes mellitus with stable proliferative diabetic retinopathy, bilateral: Secondary | ICD-10-CM | POA: Diagnosis not present

## 2016-12-04 DIAGNOSIS — H31009 Unspecified chorioretinal scars, unspecified eye: Secondary | ICD-10-CM | POA: Diagnosis not present

## 2016-12-08 ENCOUNTER — Other Ambulatory Visit: Payer: Self-pay | Admitting: Family Medicine

## 2016-12-17 DIAGNOSIS — M1712 Unilateral primary osteoarthritis, left knee: Secondary | ICD-10-CM | POA: Diagnosis not present

## 2016-12-20 ENCOUNTER — Other Ambulatory Visit: Payer: Self-pay | Admitting: Internal Medicine

## 2016-12-20 ENCOUNTER — Telehealth: Payer: Self-pay | Admitting: Internal Medicine

## 2016-12-20 NOTE — Telephone Encounter (Signed)
Preferred pharmacy was changed to [facility] off [facility] [address] [place]

## 2016-12-20 NOTE — Telephone Encounter (Signed)
Pt's insurance company has changed where she can get her Rx, now [facility]  [facility] [address]  Pt also states she is almost out of her HUMALOG KWIKPEN 100 UNIT/ML KiwkPen Pt has a $200 deductible, and they are going to let her pay $100 /mo for 2 months. However.....  Insurance wants her to switch humalog to [employer]. 1. Pt wants to know if we have a sample to get her through til she can pick up and afford this med?  2. Pt needs all meds, (preferred pharmacy) to be [facility] [address]

## 2016-12-20 NOTE — Telephone Encounter (Signed)
Please refill all medications at the drugstore Okay for patient to drop by next week for samples of short acting insulin

## 2016-12-23 ENCOUNTER — Other Ambulatory Visit: Payer: Self-pay | Admitting: Internal Medicine

## 2016-12-23 MED ORDER — ASPIRIN 81 MG PO TBEC: DELAYED_RELEASE_TABLET | ORAL | 3 refills | Status: DC

## 2016-12-23 MED ORDER — INSULIN ASPART 100 UNIT/ML FLEXPEN: 15.0000 [IU] | PEN_INJECTOR | Freq: Three times a day (TID) | SUBCUTANEOUS | 1 refills | Status: DC

## 2016-12-23 MED ORDER — COLCRYS 0.6 MG PO TABS: 0.6000 mg | ORAL_TABLET | Freq: Every day | ORAL | 5 refills | Status: DC

## 2016-12-23 MED ORDER — PRODIGY LANCING DEVICE MISC: 1.0000 | Freq: Once | 1 refills | Status: AC

## 2016-12-23 MED ORDER — POTASSIUM CHLORIDE CRYS ER 20 MEQ PO TBCR
20.0000 meq | EXTENDED_RELEASE_TABLET | Freq: Every day | ORAL | 3 refills | Status: DC
Start: 2016-12-23 — End: 2017-06-10

## 2016-12-23 MED ORDER — AMLODIPINE BESYLATE 10 MG PO TABS: 10.0000 mg | ORAL_TABLET | Freq: Every day | ORAL | 3 refills | Status: AC

## 2016-12-23 MED ORDER — INSULIN PEN NEEDLE 30G X 8 MM MISC: 1.0000 | 3 refills | Status: AC | PRN

## 2016-12-23 MED ORDER — INSULIN GLARGINE 100 UNIT/ML ~~LOC~~ SOLN: SUBCUTANEOUS | 6 refills | Status: DC

## 2016-12-23 MED ORDER — ATORVASTATIN CALCIUM 80 MG PO TABS: ORAL_TABLET | ORAL | 3 refills | Status: AC

## 2016-12-23 MED ORDER — BD SWAB SINGLE USE REGULAR PADS: MEDICATED_PAD | 4 refills | Status: DC

## 2016-12-23 MED ORDER — CYCLOBENZAPRINE HCL 5 MG PO TABS: 5.0000 mg | ORAL_TABLET | Freq: Three times a day (TID) | ORAL | 1 refills | Status: DC | PRN

## 2016-12-23 MED ORDER — ALLOPURINOL 300 MG PO TABS: 300.0000 mg | ORAL_TABLET | Freq: Every day | ORAL | 3 refills | Status: DC

## 2016-12-23 MED ORDER — LIDOCAINE 5 % EX PTCH: 1.0000 | MEDICATED_PATCH | CUTANEOUS | 3 refills | Status: DC

## 2016-12-23 MED ORDER — LORAZEPAM 0.5 MG PO TABS: 0.5000 mg | ORAL_TABLET | Freq: Two times a day (BID) | ORAL | 1 refills | Status: DC | PRN

## 2016-12-23 MED ORDER — ACCU-CHEK FASTCLIX LANCETS MISC: 0 refills | Status: DC

## 2016-12-23 MED ORDER — INSULIN LISPRO 100 UNIT/ML (KWIKPEN): PEN_INJECTOR | SUBCUTANEOUS | 1 refills | Status: DC

## 2016-12-23 MED ORDER — ACCU-CHEK NANO SMARTVIEW W/DEVICE KIT: PACK | 0 refills | Status: AC

## 2016-12-23 MED ORDER — GLUCOSE BLOOD VI STRP: ORAL_STRIP | 3 refills | Status: DC

## 2016-12-23 MED ORDER — HYDROCODONE-ACETAMINOPHEN 10-325 MG PO TABS: 0.5000 | ORAL_TABLET | Freq: Three times a day (TID) | ORAL | 0 refills | Status: DC | PRN

## 2016-12-23 NOTE — Telephone Encounter (Signed)
All medications were refill tp pt's new pharmacy and also pt will stop by today to pick up sample of insulin.

## 2016-12-31 ENCOUNTER — Ambulatory Visit (INDEPENDENT_AMBULATORY_CARE_PROVIDER_SITE_OTHER): Payer: Medicare HMO | Admitting: Internal Medicine

## 2016-12-31 ENCOUNTER — Other Ambulatory Visit: Payer: Self-pay | Admitting: Internal Medicine

## 2016-12-31 ENCOUNTER — Encounter: Payer: Self-pay | Admitting: Internal Medicine

## 2016-12-31 VITALS — BP 136/62 | HR 69 | Temp 97.7°F | Ht 65.0 in | Wt 186.2 lb

## 2016-12-31 DIAGNOSIS — I10 Essential (primary) hypertension: Secondary | ICD-10-CM | POA: Diagnosis not present

## 2016-12-31 DIAGNOSIS — Z9111 Patient's noncompliance with dietary regimen: Secondary | ICD-10-CM

## 2016-12-31 DIAGNOSIS — E1122 Type 2 diabetes mellitus with diabetic chronic kidney disease: Secondary | ICD-10-CM | POA: Diagnosis not present

## 2016-12-31 DIAGNOSIS — E1129 Type 2 diabetes mellitus with other diabetic kidney complication: Secondary | ICD-10-CM

## 2016-12-31 DIAGNOSIS — N189 Chronic kidney disease, unspecified: Secondary | ICD-10-CM

## 2016-12-31 DIAGNOSIS — Z9114 Patient's other noncompliance with medication regimen: Secondary | ICD-10-CM

## 2016-12-31 DIAGNOSIS — Z794 Long term (current) use of insulin: Secondary | ICD-10-CM

## 2016-12-31 DIAGNOSIS — E0821 Diabetes mellitus due to underlying condition with diabetic nephropathy: Secondary | ICD-10-CM

## 2016-12-31 LAB — HEMOGLOBIN A1C: HEMOGLOBIN A1C: 7.7 % — AB (ref 4.6–6.5)

## 2016-12-31 LAB — BASIC METABOLIC PANEL
BUN: 30 mg/dL — ABNORMAL HIGH (ref 6–23)
CHLORIDE: 107 meq/L (ref 96–112)
CO2: 24 meq/L (ref 19–32)
CREATININE: 2.44 mg/dL — AB (ref 0.40–1.20)
Calcium: 8.8 mg/dL (ref 8.4–10.5)
GFR: 25.45 mL/min — ABNORMAL LOW (ref >60.00)
Glucose, Bld: 62 mg/dL — ABNORMAL LOW (ref 70–99)
POTASSIUM: 3.5 meq/L (ref 3.5–5.1)
SODIUM: 140 meq/L (ref 135–145)

## 2016-12-31 MED ORDER — INSULIN GLARGINE 100 UNIT/ML ~~LOC~~ SOLN: SUBCUTANEOUS | 6 refills | Status: DC

## 2016-12-31 MED ORDER — INSULIN ASPART 100 UNIT/ML FLEXPEN: PEN_INJECTOR | SUBCUTANEOUS | 1 refills | Status: AC

## 2016-12-31 NOTE — Patient Instructions (Signed)
Limit your sodium (Salt) intake   Please check your hemoglobin A1c every 3 months  Please check your blood pressure on a regular basis.  If it is consistently greater than 150/90, please make an office appointment.  Return in 3 months for follow-up  

## 2016-12-31 NOTE — Progress Notes (Signed)
Pre visit review using our clinic review tool, if applicable. No additional management support is needed unless otherwise documented below in the visit note. 

## 2016-12-31 NOTE — Progress Notes (Signed)
 Subjective:    Patient ID: Sarah [name], female    DOB: 08/22/1951, 65 y.o.   MRN: [id number]  HPI  65-year-old patient who has a history of diabetes, gated by renal disease. She has been followed by orthopedics due to left knee pain and effusion.  Lab Results  Component Value Date   HGBA1C 8.0 (H) 09/30/2016    Her last hemoglobin A1c was poorly controlled and mealtime insulin was uptitrated to 20 units prior to each meal.  The patient states that she is only taking 15 units prior to her meals. For the past month she has been on Lantus insulin 30 units, although prior instructions have been for 15 units. She states that fasting blood sugars generally are in the 170-180 range, but she has had a fasting blood sugar as low as 54.  No hypoglycemic symptoms The patient has had a difficult time affording her medications and she has been out of her insulin therapy.  Periodically since her last visit here.  Samples have been provided when available. The patient does admit to drinking soft drinks frequently.  Past Medical History:  Diagnosis Date  . Allergy   . Anxiety   . Arthritis   . Cataract   . Chronic kidney disease   . Diabetes mellitus   . Gout   . Heart murmur   . Hyperlipidemia   . Hypertension   . Peripheral neuropathy (HCC)   . Stroke (HCC)      Social History   Social History  . Marital status: Single    Spouse name: N/A  . Number of children: N/A  . Years of education: N/A   Occupational History  . Not on file.   Social History Main Topics  . Smoking status: Never Smoker  . Smokeless tobacco: Never Used  . Alcohol use No  . Drug use: No  . Sexual activity: Not on file   Other Topics Concern  . Not on file   Social History Narrative  . No narrative on file    Past Surgical History:  Procedure Laterality Date  . APPENDECTOMY    . CATARACT EXTRACTION, BILATERAL    . EYE SURGERY    . FRACTURE SURGERY     L foot  . HEMORRHOID SURGERY    . NECK  SURGERY    . SPINE SURGERY     cervical spine  . TUBAL LIGATION      Family History  Problem Relation Age of Onset  . Cancer Mother   . Hypertension Mother   . Diabetes Mother   . Heart disease Father     Allergies  Allergen Reactions  . Metoprolol Swelling  . Cefaclor Hives  . Tetanus Toxoid Adsorbed Swelling    Current Outpatient Prescriptions on File Prior to Visit  Medication Sig Dispense Refill  . ACCU-CHEK FASTCLIX LANCETS MISC USE TO CHECK BLOOD SUGAR THREE TIMES A DAY BEFORE MEALS AND AT BEDTIME 306 each 0  . Alcohol Swabs (B-D SINGLE USE SWABS REGULAR) PADS Use as directed 100 each 4  . allopurinol (ZYLOPRIM) 300 MG tablet Take 1 tablet (300 mg total) by mouth daily. 90 tablet 3  . amLODipine (NORVASC) 10 MG tablet Take 1 tablet (10 mg total) by mouth daily. 90 tablet 3  . aspirin 81 MG EC tablet TAKE 1 TABLET BY MOUTH DAILY. SWALLOW WHOLE. 90 tablet 3  . atorvastatin (LIPITOR) 80 MG tablet TAKE 1 TABLET (80 MG TOTAL) BY MOUTH DAILY. 90 tablet 3  .   Blood Glucose Monitoring Suppl (ACCU-CHEK NANO SMARTVIEW) w/Device KIT USE AS DIRECTED 1 kit 0  . COLCRYS 0.6 MG tablet Take 1 tablet (0.6 mg total) by mouth daily. 10 tablet 5  . cyclobenzaprine (FLEXERIL) 5 MG tablet Take 1 tablet (5 mg total) by mouth 3 (three) times daily as needed for muscle spasms. 90 tablet 1  . glucose blood (ACCU-CHEK SMARTVIEW) test strip USE TO CHECK BLOOD SUGAR THREE TIMES A DAY BEFORE MEALS AND AT BEDTIME 400 each 3  . HYDROcodone-acetaminophen (NORCO) 10-325 MG tablet Take 0.5-1 tablets by mouth every 8 (eight) hours as needed. 90 tablet 0  . insulin lispro (HUMALOG KWIKPEN) 100 UNIT/ML KiwkPen INJECT 15 UNITS (0.15 ML 3 TIMES A DAY 15 pen 1  . Insulin Pen Needle (NOVOFINE) 30G X 8 MM MISC Inject 10 each into the skin as needed. 90 each 3  . lidocaine (LIDODERM) 5 % Place 1 patch onto the skin daily. APPLY 1 PATCH TO THE AFFECTED AREA AND LEAVE IN PLACE FOR 12 HOURS, THEN REMOVE AND LEAVE OFF FOR  12 HOURS 30 patch 3  . LORazepam (ATIVAN) 0.5 MG tablet Take 1 tablet (0.5 mg total) by mouth 2 (two) times daily as needed for anxiety. 180 tablet 1  . potassium chloride SA (K-DUR,KLOR-CON) 20 MEQ tablet Take 1 tablet (20 mEq total) by mouth daily. 90 tablet 3   Current Facility-Administered Medications on File Prior to Visit  Medication Dose Route Frequency Provider Last Rate Last Dose  . bumetanide (BUMEX) tablet 0.5 mg  0.5 mg Oral Daily Peter F Kwiatkowski, MD        BP 136/62 (BP Location: Left Arm, Patient Position: Sitting, Cuff Size: Normal)   Pulse 69   Temp 97.7 F (36.5 C) (Oral)   Ht 5' 5" (1.651 m)   Wt 186 lb 3.2 oz (84.5 kg)   SpO2 98%   BMI 30.99 kg/m     Review of Systems  Constitutional: Negative.   HENT: Negative for congestion, dental problem, hearing loss, rhinorrhea, sinus pressure, sore throat and tinnitus.   Eyes: Negative for pain, discharge and visual disturbance.  Respiratory: Negative for cough and shortness of breath.   Cardiovascular: Negative for chest pain, palpitations and leg swelling.  Gastrointestinal: Negative for abdominal distention, abdominal pain, blood in stool, constipation, diarrhea, nausea and vomiting.  Genitourinary: Negative for difficulty urinating, dysuria, flank pain, frequency, hematuria, pelvic pain, urgency, vaginal bleeding, vaginal discharge and vaginal pain.  Musculoskeletal: Positive for arthralgias, gait problem and joint swelling.       Left knee pain and swelling  Skin: Negative for rash.  Neurological: Negative for dizziness, syncope, speech difficulty, weakness, numbness and headaches.  Hematological: Negative for adenopathy.  Psychiatric/Behavioral: Negative for agitation, behavioral problems and dysphoric mood. The patient is not nervous/anxious.        Objective:   Physical Exam  Constitutional: She is oriented to person, place, and time. She appears well-developed and well-nourished.  HENT:  Head:  Normocephalic.  Right Ear: External ear normal.  Left Ear: External ear normal.  Mouth/Throat: Oropharynx is clear and moist.  Eyes: Conjunctivae and EOM are normal. Pupils are equal, round, and reactive to light.  Neck: Normal range of motion. Neck supple. No thyromegaly present.  Cardiovascular: Normal rate, regular rhythm, normal heart sounds and intact distal pulses.   Pulmonary/Chest: Effort normal and breath sounds normal.  Abdominal: Soft. Bowel sounds are normal. She exhibits no mass. There is no tenderness.  Musculoskeletal: Normal range of motion.    Effusion, left knee  Lymphadenopathy:    She has no cervical adenopathy.  Neurological: She is alert and oriented to person, place, and time.  Skin: Skin is warm and dry. No rash noted.  Psychiatric: She has a normal mood and affect. Her behavior is normal.          Assessment & Plan:   Diabetes mellitus.  Will check hemoglobin A1c.  Doubt this will be improved with compliance isues with both insulin administration and diet  Patient has had a difficult time affording her medications and does not use insulin continuously Essential hypertension, well-controlled Chronic kidney disease.  The patient has seen renal medicine in the past but has been very reluctant to return.  Will check renal indices and if any further deterioration.  Will set up consultation  Compliance issues addressed Follow-up 3 months Likely will be referred for renal and endocrine consultation  KWIATKOWSKI,PETER FRANK  

## 2017-01-16 DIAGNOSIS — M1712 Unilateral primary osteoarthritis, left knee: Secondary | ICD-10-CM | POA: Diagnosis not present

## 2017-01-21 ENCOUNTER — Encounter: Payer: Self-pay | Admitting: Endocrinology

## 2017-01-28 DIAGNOSIS — M1712 Unilateral primary osteoarthritis, left knee: Secondary | ICD-10-CM | POA: Diagnosis not present

## 2017-02-03 ENCOUNTER — Other Ambulatory Visit: Payer: Self-pay | Admitting: Internal Medicine

## 2017-02-03 ENCOUNTER — Telehealth: Payer: Self-pay | Admitting: Internal Medicine

## 2017-02-03 MED ORDER — HYDROCODONE-ACETAMINOPHEN 10-325 MG PO TABS: 0.5000 | ORAL_TABLET | Freq: Three times a day (TID) | ORAL | 0 refills | Status: DC | PRN

## 2017-02-03 NOTE — Telephone Encounter (Signed)
Pt notified Rx ready for pickup. Rx printed and signed.  

## 2017-02-03 NOTE — Telephone Encounter (Signed)
Pt need new Rx for Hydrocodone   Pt is aware of 3 business days for refill.

## 2017-02-04 DIAGNOSIS — M1712 Unilateral primary osteoarthritis, left knee: Secondary | ICD-10-CM | POA: Diagnosis not present

## 2017-02-11 DIAGNOSIS — M1712 Unilateral primary osteoarthritis, left knee: Secondary | ICD-10-CM | POA: Diagnosis not present

## 2017-02-17 ENCOUNTER — Telehealth: Payer: Self-pay | Admitting: Internal Medicine

## 2017-02-17 NOTE — Telephone Encounter (Signed)
Furosemide 20 mg daily #30. Low-salt diet  Follow-up 3-4 weeks or sooner if unimproved

## 2017-02-17 NOTE — Telephone Encounter (Signed)
° ° ° °  Pt said she has fluid in her left leg and is asking if she can get a fluid pill    Pharmacy;  [facility] [address]

## 2017-02-17 NOTE — Telephone Encounter (Signed)
See message below, please advise.

## 2017-02-18 DIAGNOSIS — M1712 Unilateral primary osteoarthritis, left knee: Secondary | ICD-10-CM | POA: Diagnosis not present

## 2017-02-18 MED ORDER — FUROSEMIDE 20 MG PO TABS: 20.0000 mg | ORAL_TABLET | Freq: Every day | ORAL | 1 refills | Status: DC

## 2017-03-04 ENCOUNTER — Telehealth: Payer: Self-pay | Admitting: Internal Medicine

## 2017-03-04 NOTE — Telephone Encounter (Signed)
Pt request refill  °HYDROcodone-acetaminophen (NORCO) 10-325 MG tablet °

## 2017-03-06 MED ORDER — HYDROCODONE-ACETAMINOPHEN 10-325 MG PO TABS: 0.5000 | ORAL_TABLET | Freq: Three times a day (TID) | ORAL | 0 refills | Status: DC | PRN

## 2017-03-06 NOTE — Telephone Encounter (Signed)
Detailed message was left to inform pt that her Rx is ready for pickup at the front desk

## 2017-03-06 NOTE — Telephone Encounter (Signed)
Rx printed and waiting to be signed. 

## 2017-03-10 DIAGNOSIS — E118 Type 2 diabetes mellitus with unspecified complications: Secondary | ICD-10-CM | POA: Diagnosis not present

## 2017-03-10 DIAGNOSIS — M109 Gout, unspecified: Secondary | ICD-10-CM | POA: Diagnosis not present

## 2017-03-10 DIAGNOSIS — I10 Essential (primary) hypertension: Secondary | ICD-10-CM | POA: Diagnosis not present

## 2017-03-10 DIAGNOSIS — Z6832 Body mass index (BMI) 32.0-32.9, adult: Secondary | ICD-10-CM | POA: Diagnosis not present

## 2017-03-10 DIAGNOSIS — E1122 Type 2 diabetes mellitus with diabetic chronic kidney disease: Secondary | ICD-10-CM | POA: Diagnosis not present

## 2017-03-10 DIAGNOSIS — N183 Chronic kidney disease, stage 3 (moderate): Secondary | ICD-10-CM | POA: Diagnosis not present

## 2017-03-11 ENCOUNTER — Other Ambulatory Visit: Payer: Self-pay | Admitting: Internal Medicine

## 2017-03-11 MED ORDER — LIDOCAINE 5 % EX PTCH: 1.0000 | MEDICATED_PATCH | CUTANEOUS | 3 refills | Status: DC

## 2017-03-17 DIAGNOSIS — M1712 Unilateral primary osteoarthritis, left knee: Secondary | ICD-10-CM | POA: Diagnosis not present

## 2017-03-31 ENCOUNTER — Ambulatory Visit (INDEPENDENT_AMBULATORY_CARE_PROVIDER_SITE_OTHER): Payer: Medicare HMO | Admitting: Internal Medicine

## 2017-03-31 ENCOUNTER — Encounter: Payer: Self-pay | Admitting: Internal Medicine

## 2017-03-31 VITALS — BP 150/68 | HR 68 | Temp 97.8°F | Ht 65.0 in | Wt 183.2 lb

## 2017-03-31 DIAGNOSIS — E7800 Pure hypercholesterolemia, unspecified: Secondary | ICD-10-CM | POA: Diagnosis not present

## 2017-03-31 DIAGNOSIS — I10 Essential (primary) hypertension: Secondary | ICD-10-CM

## 2017-03-31 DIAGNOSIS — Z794 Long term (current) use of insulin: Secondary | ICD-10-CM | POA: Diagnosis not present

## 2017-03-31 DIAGNOSIS — E0821 Diabetes mellitus due to underlying condition with diabetic nephropathy: Secondary | ICD-10-CM

## 2017-03-31 LAB — HEMOGLOBIN A1C: HEMOGLOBIN A1C: 7.5 % — AB (ref 4.6–6.5)

## 2017-03-31 NOTE — Progress Notes (Signed)
Subjective:    Patient ID: Sarah [name], female    DOB: 03-16-1951, 66 y.o.   MRN: [id number]  HPI  66 year old patient who is seen today in follow-up.  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  Since her last visit here, she has been seen by nephrology and is scheduled for follow-up at six-month intervals No recent eye exam Patient has not been seen by endocrine Remains on basal insulin 30 units and 20 units of prandial insulin 3 times daily  Lab Results  Component Value Date   HGBA1C 7.7 (H) 12/31/2016    Past Medical History:  Diagnosis Date  . Allergy   . Anxiety   . Arthritis   . Cataract   . Chronic kidney disease   . Diabetes mellitus   . Gout   . Heart murmur   . Hyperlipidemia   . Hypertension   . Peripheral neuropathy   . Stroke [facility])      Social History   Social History  . Marital status: Single    Spouse name: N/A  . Number of children: N/A  . Years of education: N/A   Occupational History  . Not on file.   Social History Main Topics  . Smoking status: Never Smoker  . Smokeless tobacco: Never Used  . Alcohol use No  . Drug use: No  . Sexual activity: Not on file   Other Topics Concern  . Not on file   Social History Narrative  . No narrative on file    Past Surgical History:  Procedure Laterality Date  . APPENDECTOMY    . CATARACT EXTRACTION, BILATERAL    . EYE SURGERY    . FRACTURE SURGERY     L foot  . HEMORRHOID SURGERY    . NECK SURGERY    . SPINE SURGERY     cervical spine  . TUBAL LIGATION      Family History  Problem Relation Age of Onset  . Cancer Mother   . Hypertension Mother   . Diabetes Mother   . Heart disease Father     Allergies  Allergen Reactions  . Metoprolol Swelling  . Cefaclor Hives  . Tetanus Toxoid Adsorbed Swelling    Current Outpatient Prescriptions on File Prior to Visit  Medication Sig Dispense Refill  . ACCU-CHEK FASTCLIX LANCETS MISC USE TO CHECK BLOOD SUGAR THREE TIMES A DAY  BEFORE MEALS AND AT BEDTIME 306 each 0  . ACCU-CHEK SMARTVIEW test strip USE TO CHECK BLOOD SUGAR THREE TIMES A DAY BEFORE MEALS AND AT BEDTIME 400 each 3  . Alcohol Swabs (B-D SINGLE USE SWABS REGULAR) PADS Use as directed 100 each 4  . allopurinol (ZYLOPRIM) 300 MG tablet Take 1 tablet (300 mg total) by mouth daily. 90 tablet 3  . amLODipine (NORVASC) 10 MG tablet Take 1 tablet (10 mg total) by mouth daily. 90 tablet 3  . aspirin 81 MG EC tablet TAKE 1 TABLET BY MOUTH DAILY. SWALLOW WHOLE. 90 tablet 3  . atorvastatin (LIPITOR) 80 MG tablet TAKE 1 TABLET (80 MG TOTAL) BY MOUTH DAILY. 90 tablet 3  . Blood Glucose Monitoring Suppl (ACCU-CHEK NANO SMARTVIEW) w/Device KIT USE AS DIRECTED 1 kit 0  . COLCRYS 0.6 MG tablet Take 1 tablet (0.6 mg total) by mouth daily. 10 tablet 5  . cyclobenzaprine (FLEXERIL) 5 MG tablet Take 1 tablet (5 mg total) by mouth 3 (three) times daily as needed for muscle spasms. 90 tablet 1  . furosemide (  LASIX) 20 MG tablet Take 1 tablet (20 mg total) by mouth daily. 30 tablet 1  . HYDROcodone-acetaminophen (NORCO) 10-325 MG tablet Take 0.5-1 tablets by mouth every 8 (eight) hours as needed. 90 tablet 0  . insulin aspart (NOVOLOG FLEXPEN) 100 UNIT/ML FlexPen 20 units prior to each meal 15 pen 1  . insulin glargine (LANTUS) 100 UNIT/ML injection 30 units at bedtime 15 mL 6  . insulin lispro (HUMALOG KWIKPEN) 100 UNIT/ML KiwkPen INJECT 15 UNITS (0.15 ML 3 TIMES A DAY 15 pen 1  . Insulin Pen Needle (NOVOFINE) 30G X 8 MM MISC Inject 10 each into the skin as needed. 90 each 3  . lidocaine (LIDODERM) 5 % Place 1 patch onto the skin daily. APPLY 1 PATCH TO THE AFFECTED AREA AND LEAVE IN PLACE FOR 12 HOURS, THEN REMOVE AND LEAVE OFF FOR 12 HOURS 30 patch 3  . LORazepam (ATIVAN) 0.5 MG tablet Take 1 tablet (0.5 mg total) by mouth 2 (two) times daily as needed for anxiety. 180 tablet 1  . potassium chloride SA (K-DUR,KLOR-CON) 20 MEQ tablet Take 1 tablet (20 mEq total) by mouth daily.  90 tablet 3   Current Facility-Administered Medications on File Prior to Visit  Medication Dose Route Frequency Provider Last Rate Last Dose  . bumetanide (BUMEX) tablet 0.5 mg  0.5 mg Oral Daily Marletta Lor, MD        BP (!) 150/68 (BP Location: Left Arm, Patient Position: Sitting, Cuff Size: Normal)   Pulse 68   Temp 97.8 F (36.6 C) (Oral)   Ht 5' 5" (1.651 m)   Wt 183 lb 3.2 oz (83.1 kg)   SpO2 94%   BMI 30.49 kg/m     Review of Systems  Constitutional: Negative.   HENT: Negative for congestion, dental problem, hearing loss, rhinorrhea, sinus pressure, sore throat and tinnitus.   Eyes: Negative for pain, discharge and visual disturbance.  Respiratory: Negative for cough and shortness of breath.   Cardiovascular: Positive for leg swelling. Negative for chest pain and palpitations.  Gastrointestinal: Negative for abdominal distention, abdominal pain, blood in stool, constipation, diarrhea, nausea and vomiting.  Genitourinary: Negative for difficulty urinating, dysuria, flank pain, frequency, hematuria, pelvic pain, urgency, vaginal bleeding, vaginal discharge and vaginal pain.  Musculoskeletal: Negative for arthralgias, gait problem and joint swelling.  Skin: Negative for rash.  Neurological: Negative for dizziness, syncope, speech difficulty, weakness, numbness and headaches.  Hematological: Negative for adenopathy.  Psychiatric/Behavioral: Negative for agitation, behavioral problems and dysphoric mood. The patient is not nervous/anxious.        Objective:   Physical Exam  Constitutional: She is oriented to person, place, and time. She appears well-developed and well-nourished.  Blood pressure 140/66  HENT:  Head: Normocephalic.  Right Ear: External ear normal.  Left Ear: External ear normal.  Mouth/Throat: Oropharynx is clear and moist.  Eyes: Conjunctivae and EOM are normal. Pupils are equal, round, and reactive to light.  Neck: Normal range of motion. Neck  supple. No thyromegaly present.  Right carotid bruit  Cardiovascular: Normal rate, regular rhythm and intact distal pulses.   Murmur heard. Grade 3/6 systolic murmur loudest at the primary aortic area  Pulmonary/Chest: Effort normal and breath sounds normal.  Abdominal: Soft. Bowel sounds are normal. She exhibits no mass. There is no tenderness.  Musculoskeletal: Normal range of motion. She exhibits edema.  Lymphadenopathy:    She has no cervical adenopathy.  Neurological: She is alert and oriented to person, place, and time.  Skin: Skin is warm and dry. No rash noted.  Psychiatric: She has a normal mood and affect. Her behavior is normal.          Assessment & Plan:   Diabetes mellitus.  Will check hemoglobin A1c Essential hypertension Dyslipidemia Chronic kidney disease.  Follow-up nephrology Advanced DJD left knee.  Follow-up orthopedics.  Patient is contemplating total knee replacement surgery  Follow-up 3 months  KWIATKOWSKI,PETER Pilar Plate

## 2017-03-31 NOTE — Patient Instructions (Signed)
Limit your sodium (Salt) intake   Please check your hemoglobin A1c every 3 months  Please check your blood pressure on a regular basis.  If it is consistently greater than 150/90, please make an office appointment.  Please see your eye doctor yearly to check for diabetic eye damage

## 2017-03-31 NOTE — Progress Notes (Signed)
Pre visit review using our clinic review tool, if applicable. No additional management support is needed unless otherwise documented below in the visit note. 

## 2017-04-07 ENCOUNTER — Other Ambulatory Visit: Payer: Self-pay | Admitting: Internal Medicine

## 2017-04-07 MED ORDER — HYDROCODONE-ACETAMINOPHEN 10-325 MG PO TABS: 0.5000 | ORAL_TABLET | Freq: Three times a day (TID) | ORAL | 0 refills | Status: DC | PRN

## 2017-04-07 NOTE — Telephone Encounter (Signed)
Last filled 03/06/17 #90 Last seen 03/31/17 Follow up scheduled for 03/31/17. Please advise.  Thanks!!

## 2017-04-07 NOTE — Telephone Encounter (Signed)
Printed for Dr. K to sign. 

## 2017-04-07 NOTE — Telephone Encounter (Signed)
Okay for refill #90 

## 2017-04-07 NOTE — Telephone Encounter (Signed)
Pt request refill  °HYDROcodone-acetaminophen (NORCO) 10-325 MG tablet °

## 2017-04-07 NOTE — Telephone Encounter (Signed)
Left a message for the pt to pick up at the front desk. 

## 2017-04-24 ENCOUNTER — Encounter: Payer: Self-pay | Admitting: Internal Medicine

## 2017-05-01 ENCOUNTER — Telehealth: Payer: Self-pay

## 2017-05-01 ENCOUNTER — Other Ambulatory Visit: Payer: Self-pay | Admitting: Orthopedic Surgery

## 2017-05-01 NOTE — Telephone Encounter (Signed)
Sarah ReiningNicole at [facility] has received surgical clearance letter, however she states that it needs to have specifics as to hold times for medications: aspirin, furosemide and amlodipine. She does not need a new letter, notation in this telephone encounter will suffice.  Dr. Kirtland BouchardK - Please advise. Thanks!

## 2017-05-06 DIAGNOSIS — M1712 Unilateral primary osteoarthritis, left knee: Secondary | ICD-10-CM | POA: Diagnosis not present

## 2017-05-07 ENCOUNTER — Telehealth: Payer: Self-pay | Admitting: Internal Medicine

## 2017-05-07 NOTE — Telephone Encounter (Signed)
Pt need new Rx for Hydrocodone   Pt is aware of 3 business days for refills and someone will call when ready for pick up. °

## 2017-05-08 MED ORDER — HYDROCODONE-ACETAMINOPHEN 10-325 MG PO TABS: 0.5000 | ORAL_TABLET | Freq: Three times a day (TID) | ORAL | 0 refills | Status: DC | PRN

## 2017-05-08 NOTE — Pre-Procedure Instructions (Signed)
Sarah MannsMickey L [name]  05/08/2017      [name]- [place], [place] - [address] [place] [address] Phone: [phone] Fax: [phone]  [facility] - [place], [place] - [address] [place] [id number] Phone: [phone] Fax: [phone]  [facility] [phone] - [place] - [id number] [facility] [phone] [place] [address] Phone: [phone] Fax: [phone]    Your procedure is scheduled on June 1  [facility].M.  Call this number if you have problems the morning of surgery:  [phone]   Remember:  Do not eat food or drink liquids after midnight.   Take these medicines the morning of surgery with A SIP OF WATER allopurinol (ZYLOPRIM),  amLODipine (NORVASC), cyclobenzaprine (FLEXERIL) if needed, HYDROcodone-acetaminophen (NORCO) id needed, LORazepam (ATIVAN)  WHAT DO I DO ABOUT MY DIABETES MEDICATION?   Marland Kitchen. Do not take oral diabetes medicines (pills) the morning of surgery.  . THE NIGHT BEFORE SURGERY, take _____15______ units of __insulin glargine (LANTUS) _________insulin.       . If your CBG is greater than 220 mg/dL, you may take  of your sliding scale (correction) dose of insulin.   How to Manage Your Diabetes Before and After Surgery  Why is it important to control my blood sugar before and after surgery? . Improving blood sugar levels before and after surgery helps healing and can limit problems. . A way of improving blood sugar control is eating a healthy diet by: o  Eating less sugar and carbohydrates o  Increasing activity/exercise o  Talking with your doctor about reaching your blood sugar goals . High blood sugars (greater than 180 mg/dL) can raise your risk of infections and slow your recovery, so you will need to focus on controlling your diabetes during the weeks  before surgery. . Make sure that the doctor who takes care of your diabetes knows about your planned surgery including the date and location.  How do I manage my blood sugar before surgery? . Check your blood sugar at least 4 times a day, starting 2 days before surgery, to make sure that the level is not too high or low. o Check your blood sugar the morning of your surgery when you wake up and every 2 hours until you get to the Short Stay unit. . If your blood sugar is less than 70 mg/dL, you will need to treat for low blood sugar: o Do not take insulin. o Treat a low blood sugar (less than 70 mg/dL) with  cup of clear juice (cranberry or apple), 4 glucose tablets, OR glucose gel. o Recheck blood sugar in 15 minutes after treatment (to make sure it is greater than 70 mg/dL). If your blood sugar is not greater than 70 mg/dL on recheck, call [phone] for further instructions. . Report your blood sugar to the short stay nurse when you get to Short Stay.  . If you are admitted to the [name] after surgery: o Your blood sugar will be checked by the staff and you will probably be given insulin after surgery (instead of oral diabetes medicines) to make sure you have good blood sugar levels. o The goal for blood sugar control after surgery is 80-180 mg/dL.     Do not wear jewelry, make-up or nail polish.  Do not  wear lotions, powders, or perfumes, or deoderant.  Do not shave 48 hours prior to surgery.  Men may shave face and neck.  Do not bring valuables to the [name].  [facility] is not responsible for any belongings or valuables.  Contacts, dentures or bridgework may not be worn into surgery.  Leave your suitcase in the car.  After surgery it may be brought to your room.  For patients admitted to the [name], discharge time will be determined by your treatment team.  Patients discharged the day of surgery will not be allowed to drive home.   Special instructions:   [place]-  Preparing For Surgery  Before surgery, you can play an important role. Because skin is not sterile, your skin needs to be as free of germs as possible. You can reduce the number of germs on your skin by washing with CHG (chlorahexidine gluconate) Soap before surgery.  CHG is an antiseptic cleaner which kills germs and bonds with the skin to continue killing germs even after washing.  Please do not use if you have an allergy to CHG or antibacterial soaps. If your skin becomes reddened/irritated stop using the CHG.  Do not shave (including legs and underarms) for at least 48 hours prior to first CHG shower. It is OK to shave your face.  Please follow these instructions carefully.   1. Shower the NIGHT BEFORE SURGERY and the MORNING OF SURGERY with CHG.   2. If you chose to wash your hair, wash your hair first as usual with your normal shampoo.  3. After you shampoo, rinse your hair and body thoroughly to remove the shampoo.  4. Use CHG as you would any other liquid soap. You can apply CHG directly to the skin and wash gently with a scrungie or a clean washcloth.   5. Apply the CHG Soap to your body ONLY FROM THE NECK DOWN.  Do not use on open wounds or open sores. Avoid contact with your eyes, ears, mouth and genitals (private parts). Wash genitals (private parts) with your normal soap.  6. Wash thoroughly, paying special attention to the area where your surgery will be performed.  7. Thoroughly rinse your body with warm water from the neck down.  8. DO NOT shower/wash with your normal soap after using and rinsing off the CHG Soap.  9. Pat yourself dry with a CLEAN TOWEL.   10. Wear CLEAN PAJAMAS   11. Place CLEAN SHEETS on your bed the night of your first shower and DO NOT SLEEP WITH PETS.    Day of Surgery: Do not apply any deodorants/lotions. Please wear clean clothes to the [name]/surgery center.      Please read over the following fact sheets that you were  given.

## 2017-05-08 NOTE — Telephone Encounter (Signed)
Pt notified Rx ready for pickup. Rx printed and signed.  

## 2017-05-08 NOTE — Telephone Encounter (Signed)
Rx printed awaiting to be signed. Dr Kirtland BouchardK will return to the office on 05/13/17.

## 2017-05-09 ENCOUNTER — Encounter (HOSPITAL_COMMUNITY)
Admission: RE | Admit: 2017-05-09 | Discharge: 2017-05-09 | Disposition: A | Discharge status: Home / Self Care | Payer: Medicare HMO | Source: Ambulatory Visit | Attending: Orthopedic Surgery | Admitting: Orthopedic Surgery

## 2017-05-09 ENCOUNTER — Encounter (HOSPITAL_COMMUNITY): Payer: Self-pay

## 2017-05-09 ENCOUNTER — Ambulatory Visit (HOSPITAL_COMMUNITY)
Admission: RE | Admit: 2017-05-09 | Discharge: 2017-05-09 | Disposition: A | Discharge status: Home / Self Care | Payer: Medicare HMO | Source: Ambulatory Visit | Attending: Orthopedic Surgery | Admitting: Orthopedic Surgery

## 2017-05-09 DIAGNOSIS — Z01812 Encounter for preprocedural laboratory examination: Secondary | ICD-10-CM | POA: Diagnosis not present

## 2017-05-09 DIAGNOSIS — R918 Other nonspecific abnormal finding of lung field: Secondary | ICD-10-CM | POA: Insufficient documentation

## 2017-05-09 DIAGNOSIS — Z01818 Encounter for other preprocedural examination: Secondary | ICD-10-CM | POA: Diagnosis not present

## 2017-05-09 DIAGNOSIS — J9811 Atelectasis: Secondary | ICD-10-CM | POA: Diagnosis not present

## 2017-05-09 HISTORY — DX: Other specified postprocedural states: Z98.890

## 2017-05-09 HISTORY — DX: Nausea with vomiting, unspecified: R11.2

## 2017-05-09 LAB — COMPREHENSIVE METABOLIC PANEL
ALK PHOS: 91 U/L (ref 38–126)
ALT: 16 U/L (ref 14–54)
AST: 24 U/L (ref 15–41)
Albumin: 4 g/dL (ref 3.5–5.0)
Anion gap: 9 (ref 5–15)
BILIRUBIN TOTAL: 0.7 mg/dL (ref 0.3–1.2)
BUN: 33 mg/dL — ABNORMAL HIGH (ref 6–20)
CALCIUM: 9.1 mg/dL (ref 8.9–10.3)
CO2: 23 mmol/L (ref 22–32)
Chloride: 106 mmol/L (ref 101–111)
Creatinine, Ser: 2.74 mg/dL — ABNORMAL HIGH (ref 0.44–1.00)
GFR, EST AFRICAN AMERICAN: 20 mL/min — AB (ref >60)
GFR, EST NON AFRICAN AMERICAN: 17 mL/min — AB (ref >60)
Glucose, Bld: 166 mg/dL — ABNORMAL HIGH (ref 65–99)
Potassium: 3.8 mmol/L (ref 3.5–5.1)
SODIUM: 138 mmol/L (ref 135–145)
Total Protein: 8.1 g/dL (ref 6.5–8.1)

## 2017-05-09 LAB — URINALYSIS, ROUTINE W REFLEX MICROSCOPIC
BILIRUBIN URINE: NEGATIVE
Bacteria, UA: NONE SEEN
GLUCOSE, UA: 150 mg/dL — AB
Hgb urine dipstick: NEGATIVE
KETONES UR: NEGATIVE mg/dL
LEUKOCYTES UA: NEGATIVE
Nitrite: NEGATIVE
PH: 6 (ref 5.0–8.0)
Protein, ur: 100 mg/dL — AB
Specific Gravity, Urine: 1.014 (ref 1.005–1.030)

## 2017-05-09 LAB — CBC WITH DIFFERENTIAL/PLATELET
Basophils Absolute: 0 10*3/uL (ref 0.0–0.1)
Basophils Relative: 0 %
Eosinophils Absolute: 0.2 10*3/uL (ref 0.0–0.7)
Eosinophils Relative: 3 %
HEMATOCRIT: 37.1 % (ref 36.0–46.0)
Hemoglobin: 12.5 g/dL (ref 12.0–15.0)
LYMPHS ABS: 2.6 10*3/uL (ref 0.7–4.0)
LYMPHS PCT: 30 %
MCH: 26.5 pg (ref 26.0–34.0)
MCHC: 33.7 g/dL (ref 30.0–36.0)
MCV: 78.6 fL (ref 78.0–100.0)
Monocytes Absolute: 0.4 10*3/uL (ref 0.1–1.0)
Monocytes Relative: 5 %
NEUTROS ABS: 5.4 10*3/uL (ref 1.7–7.7)
NEUTROS PCT: 62 %
Platelets: 347 10*3/uL (ref 150–400)
RBC: 4.72 MIL/uL (ref 3.87–5.11)
RDW: 14.3 % (ref 11.5–15.5)
WBC: 8.6 10*3/uL (ref 4.0–10.5)

## 2017-05-09 LAB — ABO/RH: ABO/RH(D): B POS

## 2017-05-09 LAB — TYPE AND SCREEN
ABO/RH(D): B POS
Antibody Screen: NEGATIVE

## 2017-05-09 LAB — PROTIME-INR
INR: 0.98
Prothrombin Time: 13 seconds (ref 11.4–15.2)

## 2017-05-09 LAB — GLUCOSE, CAPILLARY: Glucose-Capillary: 164 mg/dL — ABNORMAL HIGH (ref 65–99)

## 2017-05-09 LAB — SURGICAL PCR SCREEN
MRSA, PCR: NEGATIVE
Staphylococcus aureus: POSITIVE — AB

## 2017-05-09 LAB — APTT: aPTT: 31 seconds (ref 24–36)

## 2017-05-09 NOTE — Progress Notes (Signed)
PCP - Eleonore ChiquitoPeter Kwiatkowski Cardiologist - denies  Chest x-ray - 05/09/17 EKG - 05/12/17 Stress Test - denies ECHO - 05/17/15 Cardiac Cath - denies   Fasting Blood Sugar - 90s Checks Blood Sugar __3___ times a day     Patient denies shortness of breath, fever, cough and chest pain at PAT appointment   Patient verbalized understanding of instructions that were given to them at the PAT appointment. Patient was also instructed that they will need to review over the PAT instructions again at home before surgery.

## 2017-05-15 MED ORDER — CLINDAMYCIN PHOSPHATE 900 MG/50ML IV SOLN
900.0000 mg | INTRAVENOUS | Status: AC
  Administered 2017-05-16: 900 mg via INTRAVENOUS
  Filled 2017-05-15: qty 50

## 2017-05-15 NOTE — Anesthesia Preprocedure Evaluation (Addendum)
Anesthesia Evaluation  Patient identified by MRN, date of birth, ID band Patient awake    Reviewed: Allergy & Precautions, NPO status , Patient's Chart, lab work & pertinent test results  History of Anesthesia Complications (+) PONV and history of anesthetic complications  Airway Mallampati: II  TM Distance: >3 FB Neck ROM: Full    Dental no notable dental hx. (+) Poor Dentition,    Pulmonary neg pulmonary ROS,    Pulmonary exam normal breath sounds clear to auscultation       Cardiovascular hypertension, Pt. on medications Normal cardiovascular exam+ Valvular Problems/Murmurs AS  Rhythm:Regular Rate:Normal  ECG: NSR, rate 67 ECHO: Left ventricle: The cavity size was normal. Wall thickness was normal. Systolic function was normal. The estimated ejection fraction was in the range of 60% to 65%. Wall motion was normal; there were no regional wall motion abnormalities. Doppler parameters are consistent with abnormal left ventricular relaxation (grade 1 diastolic dysfunction). - Aortic valve: There was very mild stenosis. There was trivial regurgitation. - Mitral valve: Mild focal thickening and calcification of the   anterior leaflet. - Left atrium: The atrium was mildly dilated. - Atrial septum: A patent foramen ovale cannot be excluded. - Pulmonary arteries: Systolic pressure was mildly increased. PA peak pressure: 38 mm Hg (S).   Neuro/Psych Anxiety CVA    GI/Hepatic negative GI ROS, Neg liver ROS,   Endo/Other  diabetes, Insulin Dependent  Renal/GU CRFRenal disease  negative genitourinary   Musculoskeletal negative musculoskeletal ROS (+)   Abdominal   Peds negative pediatric ROS (+)  Hematology negative hematology ROS (+)   Anesthesia Other Findings Hyperlipidemia obese  Reproductive/Obstetrics negative OB ROS                           Anesthesia Physical Anesthesia Plan  ASA:  III  Anesthesia Plan: Regional and Spinal   Post-op Pain Management:  Regional for Post-op pain   Induction: Intravenous  Airway Management Planned: Simple Face Mask  Additional Equipment:   Intra-op Plan:   Post-operative Plan:   Informed Consent: I have reviewed the patients History and Physical, chart, labs and discussed the procedure including the risks, benefits and alternatives for the proposed anesthesia with the patient or authorized representative who has indicated his/her understanding and acceptance.   Dental advisory given  Plan Discussed with: CRNA  Anesthesia Plan Comments:        Anesthesia Quick Evaluation

## 2017-05-15 NOTE — H&P (Signed)
TOTAL KNEE ADMISSION H&P  Patient is being admitted for left total knee arthroplasty.  Subjective:  Chief Complaint:left knee pain.  HPI: Sarah [name], 66 y.o. female, has a history of pain and functional disability in the left knee due to arthritis and has failed non-surgical conservative treatments for greater than 12 weeks to includeNSAID's and/or analgesics, corticosteriod injections, viscosupplementation injections, use of assistive devices and activity modification.  Onset of symptoms was gradual, starting 8 years ago with stable course since that time. The patient noted no past surgery on the left knee(s).  Patient currently rates pain in the left knee(s) at 8 out of 10 with activity. Patient has night pain, worsening of pain with activity and weight bearing, pain that interferes with activities of daily living, pain with passive range of motion, crepitus and joint swelling.  Patient has evidence of subchondral sclerosis, periarticular osteophytes, joint subluxation and joint space narrowing by imaging studies. This patient has had failure of all reasonable conservative care. There is no active infection.  Patient Active Problem List   Diagnosis Date Noted  . Disorder of kidney and ureter 06/29/2009  . ANXIETY 07/01/2008  . Diabetes mellitus with renal complications ([place]) 16/94/5038  . Pure hypercholesterolemia 05/22/2007  . Essential hypertension 05/22/2007  . History of cardiovascular disorder 05/22/2007  . ARTHROSCOPY, KNEE, HX OF 05/22/2007   Past Medical History:  Diagnosis Date  . Allergy   . Anxiety    history   . Arthritis   . Cataract    history  . Chronic kidney disease   . Diabetes mellitus   . Gout   . Heart murmur   . Hyperlipidemia   . Hypertension   . Peripheral neuropathy   . PONV (postoperative nausea and vomiting)   . Stroke [facility])     Past Surgical History:  Procedure Laterality Date  . APPENDECTOMY    . CATARACT EXTRACTION, BILATERAL    . EYE  SURGERY    . FRACTURE SURGERY     L foot  . HEMORRHOID SURGERY    . NECK SURGERY    . SPINE SURGERY     cervical spine  . TUBAL LIGATION      No prescriptions prior to admission.   Allergies  Allergen Reactions  . Metoprolol Swelling    SWELLING REACTION UNSPECIFIED   . Cefaclor Hives  . Tetanus Toxoid Adsorbed Swelling    SWELLING REACTION UNSPECIFIED     Social History  Substance Use Topics  . Smoking status: Never Smoker  . Smokeless tobacco: Never Used  . Alcohol use No    Family History  Problem Relation Age of Onset  . Cancer Mother   . Hypertension Mother   . Diabetes Mother   . Heart disease Father      ROS ROS: I have reviewed the patient's review of systems thoroughly and there are no positive responses as relates to the HPI. Objective:  Physical Exam  Vital signs in last 24 hours:   Well-developed well-nourished patient in no acute distress. Alert and oriented x3 HEENT:within normal limits Cardiac: Regular rate and rhythm Pulmonary: Lungs clear to auscultation Abdomen: Soft and nontender.  Normal active bowel sounds  Musculoskeletal: (l knee: painful ROM no instability/ trace effusuion Labs: Recent Results (from the past 2160 hour(s))  Hemoglobin A1c     Status: Abnormal   Collection Time: 03/31/17 11:39 AM  Result Value Ref Range   Hgb A1c MFr Bld 7.5 (H) 4.6 - 6.5 %  Comment: Glycemic Control Guidelines for People with Diabetes:Non Diabetic:  <6%Goal of Therapy: <7%Additional Action Suggested:  >8%   Glucose, capillary     Status: Abnormal   Collection Time: 05/09/17 10:19 AM  Result Value Ref Range   Glucose-Capillary 164 (H) 65 - 99 mg/dL  Urinalysis, Routine w reflex microscopic     Status: Abnormal   Collection Time: 05/09/17 10:49 AM  Result Value Ref Range   Color, Urine YELLOW YELLOW   APPearance CLEAR CLEAR   Specific Gravity, Urine 1.014 1.005 - 1.030   pH 6.0 5.0 - 8.0   Glucose, UA 150 (A) NEGATIVE mg/dL   Hgb urine  dipstick NEGATIVE NEGATIVE   Bilirubin Urine NEGATIVE NEGATIVE   Ketones, ur NEGATIVE NEGATIVE mg/dL   Protein, ur 100 (A) NEGATIVE mg/dL   Nitrite NEGATIVE NEGATIVE   Leukocytes, UA NEGATIVE NEGATIVE   RBC / HPF 0-5 0 - 5 RBC/hpf   WBC, UA 0-5 0 - 5 WBC/hpf   Bacteria, UA NONE SEEN NONE SEEN   Squamous Epithelial / LPF 0-5 (A) NONE SEEN   Mucous PRESENT   Surgical pcr screen     Status: Abnormal   Collection Time: 05/09/17 10:49 AM  Result Value Ref Range   MRSA, PCR NEGATIVE NEGATIVE   Staphylococcus aureus POSITIVE (A) NEGATIVE    Comment:        The Xpert SA Assay (FDA approved for NASAL specimens in patients over 11 years of age), is one component of a comprehensive surveillance program.  Test performance has been validated by [facility] for patients greater than or equal to 52 year old. It is not intended to diagnose infection nor to guide or monitor treatment.   APTT     Status: None   Collection Time: 05/09/17 10:50 AM  Result Value Ref Range   aPTT 31 24 - 36 seconds  CBC WITH DIFFERENTIAL     Status: None   Collection Time: 05/09/17 10:50 AM  Result Value Ref Range   WBC 8.6 4.0 - 10.5 K/uL   RBC 4.72 3.87 - 5.11 MIL/uL   Hemoglobin 12.5 12.0 - 15.0 g/dL   HCT 37.1 36.0 - 46.0 %   MCV 78.6 78.0 - 100.0 fL   MCH 26.5 26.0 - 34.0 pg   MCHC 33.7 30.0 - 36.0 g/dL   RDW 14.3 11.5 - 15.5 %   Platelets 347 150 - 400 K/uL   Neutrophils Relative % 62 %   Neutro Abs 5.4 1.7 - 7.7 K/uL   Lymphocytes Relative 30 %   Lymphs Abs 2.6 0.7 - 4.0 K/uL   Monocytes Relative 5 %   Monocytes Absolute 0.4 0.1 - 1.0 K/uL   Eosinophils Relative 3 %   Eosinophils Absolute 0.2 0.0 - 0.7 K/uL   Basophils Relative 0 %   Basophils Absolute 0.0 0.0 - 0.1 K/uL  Comprehensive metabolic panel     Status: Abnormal   Collection Time: 05/09/17 10:50 AM  Result Value Ref Range   Sodium 138 135 - 145 mmol/L   Potassium 3.8 3.5 - 5.1 mmol/L   Chloride 106 101 - 111 mmol/L   CO2 23 22  - 32 mmol/L   Glucose, Bld 166 (H) 65 - 99 mg/dL   BUN 33 (H) 6 - 20 mg/dL   Creatinine, Ser 2.74 (H) 0.44 - 1.00 mg/dL   Calcium 9.1 8.9 - 10.3 mg/dL   Total Protein 8.1 6.5 - 8.1 g/dL   Albumin 4.0 3.5 - 5.0 g/dL  AST 24 15 - 41 U/L   ALT 16 14 - 54 U/L   Alkaline Phosphatase 91 38 - 126 U/L   Total Bilirubin 0.7 0.3 - 1.2 mg/dL   GFR calc non Af Amer 17 (L) >60 mL/min   GFR calc Af Amer 20 (L) >60 mL/min    Comment: (NOTE) The eGFR has been calculated using the CKD EPI equation. This calculation has not been validated in all clinical situations. eGFR's persistently <60 mL/min signify possible Chronic Kidney Disease.    Anion gap 9 5 - 15  Protime-INR     Status: None   Collection Time: 05/09/17 10:50 AM  Result Value Ref Range   Prothrombin Time 13.0 11.4 - 15.2 seconds   INR 0.98   Type and screen Order type and screen if day of surgery is less than 15 days from draw of preadmission visit or order morning of surgery if day of surgery is greater than 6 days from preadmission visit.     Status: None   Collection Time: 05/09/17 11:02 AM  Result Value Ref Range   ABO/RH(D) B POS    Antibody Screen NEG    Sample Expiration 05/23/2017    Extend sample reason NO TRANSFUSIONS OR PREGNANCY IN THE PAST 3 MONTHS   ABO/Rh     Status: None   Collection Time: 05/09/17 11:02 AM  Result Value Ref Range   ABO/RH(D) B POS     Estimated body mass index is 31.71 kg/m as calculated from the following:   Height as of 05/09/17: '5\' 3"'  (1.6 m).   Weight as of 05/09/17: 81.2 kg (179 lb 0.2 oz).   Imaging Review Plain radiographs demonstrate severe degenerative joint disease of the left knee(s). The overall alignment ismild varus. The bone quality appears to be fair for age and reported activity level.  Assessment/Plan:  End stage arthritis, left knee   The patient history, physical examination, clinical judgment of the provider and imaging studies are consistent with end stage  degenerative joint disease of the left knee(s) and total knee arthroplasty is deemed medically necessary. The treatment options including medical management, injection therapy arthroscopy and arthroplasty were discussed at length. The risks and benefits of total knee arthroplasty were presented and reviewed. The risks due to aseptic loosening, infection, stiffness, patella tracking problems, thromboembolic complications and other imponderables were discussed. The patient acknowledged the explanation, agreed to proceed with the plan and consent was signed. Patient is being admitted for inpatient treatment for surgery, pain control, PT, OT, prophylactic antibiotics, VTE prophylaxis, progressive ambulation and ADL's and discharge planning. The patient is planning to be discharged home with home health services

## 2017-05-16 ENCOUNTER — Inpatient Hospital Stay (HOSPITAL_COMMUNITY)
Admission: RE | Admit: 2017-05-16 | Discharge: 2017-05-20 | DRG: 470 | Disposition: A | Discharge status: Skilled Nursing Facility | Payer: Medicare HMO | Source: Ambulatory Visit | Attending: Orthopedic Surgery | Admitting: Orthopedic Surgery

## 2017-05-16 ENCOUNTER — Inpatient Hospital Stay (HOSPITAL_COMMUNITY): Payer: Medicare HMO | Admitting: Certified Registered Nurse Anesthetist

## 2017-05-16 ENCOUNTER — Encounter (HOSPITAL_COMMUNITY)
Admission: RE | Disposition: A | Discharge status: Skilled Nursing Facility | Payer: Self-pay | Source: Ambulatory Visit | Attending: Orthopedic Surgery

## 2017-05-16 ENCOUNTER — Encounter (HOSPITAL_COMMUNITY): Payer: Self-pay | Admitting: *Deleted

## 2017-05-16 DIAGNOSIS — E785 Hyperlipidemia, unspecified: Secondary | ICD-10-CM | POA: Diagnosis present

## 2017-05-16 DIAGNOSIS — Z794 Long term (current) use of insulin: Secondary | ICD-10-CM

## 2017-05-16 DIAGNOSIS — R2681 Unsteadiness on feet: Secondary | ICD-10-CM | POA: Diagnosis not present

## 2017-05-16 DIAGNOSIS — M1712 Unilateral primary osteoarthritis, left knee: Secondary | ICD-10-CM | POA: Diagnosis not present

## 2017-05-16 DIAGNOSIS — Z8673 Personal history of transient ischemic attack (TIA), and cerebral infarction without residual deficits: Secondary | ICD-10-CM

## 2017-05-16 DIAGNOSIS — Z887 Allergy status to serum and vaccine status: Secondary | ICD-10-CM | POA: Diagnosis not present

## 2017-05-16 DIAGNOSIS — N289 Disorder of kidney and ureter, unspecified: Secondary | ICD-10-CM | POA: Diagnosis not present

## 2017-05-16 DIAGNOSIS — I129 Hypertensive chronic kidney disease with stage 1 through stage 4 chronic kidney disease, or unspecified chronic kidney disease: Secondary | ICD-10-CM | POA: Diagnosis not present

## 2017-05-16 DIAGNOSIS — Z888 Allergy status to other drugs, medicaments and biological substances status: Secondary | ICD-10-CM | POA: Diagnosis not present

## 2017-05-16 DIAGNOSIS — M6281 Muscle weakness (generalized): Secondary | ICD-10-CM | POA: Diagnosis not present

## 2017-05-16 DIAGNOSIS — E669 Obesity, unspecified: Secondary | ICD-10-CM | POA: Diagnosis present

## 2017-05-16 DIAGNOSIS — I10 Essential (primary) hypertension: Secondary | ICD-10-CM | POA: Diagnosis not present

## 2017-05-16 DIAGNOSIS — Z96652 Presence of left artificial knee joint: Secondary | ICD-10-CM | POA: Diagnosis not present

## 2017-05-16 DIAGNOSIS — E1142 Type 2 diabetes mellitus with diabetic polyneuropathy: Secondary | ICD-10-CM | POA: Diagnosis present

## 2017-05-16 DIAGNOSIS — Z471 Aftercare following joint replacement surgery: Secondary | ICD-10-CM | POA: Diagnosis not present

## 2017-05-16 DIAGNOSIS — E7800 Pure hypercholesterolemia, unspecified: Secondary | ICD-10-CM | POA: Diagnosis not present

## 2017-05-16 DIAGNOSIS — G8918 Other acute postprocedural pain: Secondary | ICD-10-CM | POA: Diagnosis not present

## 2017-05-16 DIAGNOSIS — M25662 Stiffness of left knee, not elsewhere classified: Secondary | ICD-10-CM | POA: Diagnosis present

## 2017-05-16 DIAGNOSIS — R278 Other lack of coordination: Secondary | ICD-10-CM | POA: Diagnosis not present

## 2017-05-16 DIAGNOSIS — S86191A Other injury of other muscle(s) and tendon(s) of posterior muscle group at lower leg level, right leg, initial encounter: Secondary | ICD-10-CM | POA: Diagnosis not present

## 2017-05-16 DIAGNOSIS — Z833 Family history of diabetes mellitus: Secondary | ICD-10-CM

## 2017-05-16 DIAGNOSIS — Z8249 Family history of ischemic heart disease and other diseases of the circulatory system: Secondary | ICD-10-CM | POA: Diagnosis not present

## 2017-05-16 DIAGNOSIS — R112 Nausea with vomiting, unspecified: Secondary | ICD-10-CM | POA: Diagnosis present

## 2017-05-16 DIAGNOSIS — Z6831 Body mass index (BMI) 31.0-31.9, adult: Secondary | ICD-10-CM | POA: Diagnosis not present

## 2017-05-16 DIAGNOSIS — E1122 Type 2 diabetes mellitus with diabetic chronic kidney disease: Secondary | ICD-10-CM | POA: Diagnosis not present

## 2017-05-16 DIAGNOSIS — M1990 Unspecified osteoarthritis, unspecified site: Secondary | ICD-10-CM | POA: Diagnosis not present

## 2017-05-16 DIAGNOSIS — E119 Type 2 diabetes mellitus without complications: Secondary | ICD-10-CM | POA: Diagnosis not present

## 2017-05-16 DIAGNOSIS — E1129 Type 2 diabetes mellitus with other diabetic kidney complication: Secondary | ICD-10-CM | POA: Diagnosis present

## 2017-05-16 HISTORY — PX: TOTAL KNEE ARTHROPLASTY: SHX125

## 2017-05-16 LAB — GLUCOSE, CAPILLARY
GLUCOSE-CAPILLARY: 154 mg/dL — AB (ref 65–99)
GLUCOSE-CAPILLARY: 245 mg/dL — AB (ref 65–99)
Glucose-Capillary: 171 mg/dL — ABNORMAL HIGH (ref 65–99)
Glucose-Capillary: 237 mg/dL — ABNORMAL HIGH (ref 65–99)
Glucose-Capillary: 265 mg/dL — ABNORMAL HIGH (ref 65–99)

## 2017-05-16 SURGERY — ARTHROPLASTY, KNEE, TOTAL
Anesthesia: Regional | Site: Knee | Laterality: Left

## 2017-05-16 MED ORDER — ONDANSETRON HCL 4 MG PO TABS: 4.0000 mg | ORAL_TABLET | Freq: Four times a day (QID) | ORAL | Status: DC | PRN

## 2017-05-16 MED ORDER — SODIUM CHLORIDE 0.9 % IV SOLN
1000.0000 mg | INTRAVENOUS | Status: AC
  Administered 2017-05-16: 1000 mg via INTRAVENOUS
  Filled 2017-05-16: qty 10

## 2017-05-16 MED ORDER — SUCCINYLCHOLINE CHLORIDE 200 MG/10ML IV SOSY
PREFILLED_SYRINGE | INTRAVENOUS | Status: AC
  Filled 2017-05-16: qty 10

## 2017-05-16 MED ORDER — ACETAMINOPHEN 325 MG PO TABS
650.0000 mg | ORAL_TABLET | Freq: Four times a day (QID) | ORAL | Status: DC | PRN
  Administered 2017-05-18 – 2017-05-19 (×2): 650 mg via ORAL
  Filled 2017-05-16 (×3): qty 2

## 2017-05-16 MED ORDER — SUCCINYLCHOLINE CHLORIDE 200 MG/10ML IV SOSY
PREFILLED_SYRINGE | INTRAVENOUS | Status: DC | PRN
  Administered 2017-05-16: 100 mg via INTRAVENOUS

## 2017-05-16 MED ORDER — ATORVASTATIN CALCIUM 80 MG PO TABS
80.0000 mg | ORAL_TABLET | Freq: Every day | ORAL | Status: DC
  Administered 2017-05-16 – 2017-05-18 (×2): 80 mg via ORAL
  Filled 2017-05-16 (×3): qty 1
  Filled 2017-05-16: qty 2

## 2017-05-16 MED ORDER — ALUM & MAG HYDROXIDE-SIMETH 200-200-20 MG/5ML PO SUSP
30.0000 mL | ORAL | Status: DC | PRN
  Administered 2017-05-20: 30 mL via ORAL
  Filled 2017-05-16: qty 30

## 2017-05-16 MED ORDER — GLYCOPYRROLATE 0.2 MG/ML IV SOSY
PREFILLED_SYRINGE | INTRAVENOUS | Status: DC | PRN
Start: 2017-05-16 — End: 2017-05-16
  Administered 2017-05-16: .2 mg via INTRAVENOUS
  Administered 2017-05-16 (×2): .1 mg via INTRAVENOUS
  Administered 2017-05-16: .2 mg via INTRAVENOUS

## 2017-05-16 MED ORDER — GABAPENTIN 300 MG PO CAPS
300.0000 mg | ORAL_CAPSULE | Freq: Two times a day (BID) | ORAL | Status: DC
  Administered 2017-05-16 – 2017-05-20 (×8): 300 mg via ORAL
  Filled 2017-05-16 (×8): qty 1

## 2017-05-16 MED ORDER — PHENYLEPHRINE HCL 10 MG/ML IJ SOLN
INTRAVENOUS | Status: DC | PRN
  Administered 2017-05-16: 20 ug/min via INTRAVENOUS

## 2017-05-16 MED ORDER — OXYCODONE-ACETAMINOPHEN 5-325 MG PO TABS: 1.0000 | ORAL_TABLET | Freq: Four times a day (QID) | ORAL | 0 refills | Status: DC | PRN

## 2017-05-16 MED ORDER — FUROSEMIDE 20 MG PO TABS
20.0000 mg | ORAL_TABLET | Freq: Every day | ORAL | Status: DC
  Administered 2017-05-16 – 2017-05-18 (×2): 20 mg via ORAL
  Filled 2017-05-16 (×2): qty 1

## 2017-05-16 MED ORDER — DOCUSATE SODIUM 100 MG PO CAPS
100.0000 mg | ORAL_CAPSULE | Freq: Two times a day (BID) | ORAL | Status: DC
  Administered 2017-05-16 – 2017-05-20 (×8): 100 mg via ORAL
  Filled 2017-05-16 (×8): qty 1

## 2017-05-16 MED ORDER — BUPIVACAINE LIPOSOME 1.3 % IJ SUSP
20.0000 mL | INTRAMUSCULAR | Status: AC
  Administered 2017-05-16: 20 mL
  Filled 2017-05-16: qty 20

## 2017-05-16 MED ORDER — LIDOCAINE 2% (20 MG/ML) 5 ML SYRINGE
INTRAMUSCULAR | Status: DC | PRN
  Administered 2017-05-16: 40 mg via INTRAVENOUS

## 2017-05-16 MED ORDER — INSULIN GLARGINE 100 UNIT/ML ~~LOC~~ SOLN
30.0000 [IU] | Freq: Every day | SUBCUTANEOUS | Status: DC
Start: 2017-05-16 — End: 2017-05-20
  Administered 2017-05-16 – 2017-05-19 (×4): 30 [IU] via SUBCUTANEOUS
  Filled 2017-05-16 (×4): qty 0.3

## 2017-05-16 MED ORDER — BUPIVACAINE HCL (PF) 0.5 % IJ SOLN
INTRAMUSCULAR | Status: AC
  Filled 2017-05-16: qty 30

## 2017-05-16 MED ORDER — ASPIRIN EC 325 MG PO TBEC: 325.0000 mg | DELAYED_RELEASE_TABLET | Freq: Two times a day (BID) | ORAL | 0 refills | Status: DC

## 2017-05-16 MED ORDER — FENTANYL CITRATE (PF) 100 MCG/2ML IJ SOLN
INTRAMUSCULAR | Status: DC | PRN
  Administered 2017-05-16: 50 ug via INTRAVENOUS

## 2017-05-16 MED ORDER — ASPIRIN EC 325 MG PO TBEC
325.0000 mg | DELAYED_RELEASE_TABLET | Freq: Two times a day (BID) | ORAL | Status: DC
  Administered 2017-05-16 – 2017-05-20 (×7): 325 mg via ORAL
  Filled 2017-05-16 (×7): qty 1

## 2017-05-16 MED ORDER — DIPHENHYDRAMINE HCL 12.5 MG/5ML PO ELIX: 12.5000 mg | ORAL_SOLUTION | ORAL | Status: DC | PRN

## 2017-05-16 MED ORDER — TRANEXAMIC ACID 1000 MG/10ML IV SOLN
1000.0000 mg | Freq: Once | INTRAVENOUS | Status: AC
  Administered 2017-05-16: 1000 mg via INTRAVENOUS
  Filled 2017-05-16 (×2): qty 10

## 2017-05-16 MED ORDER — PROPOFOL 10 MG/ML IV BOLUS
INTRAVENOUS | Status: AC
  Filled 2017-05-16: qty 20

## 2017-05-16 MED ORDER — ONDANSETRON HCL 4 MG/2ML IJ SOLN
INTRAMUSCULAR | Status: AC
  Filled 2017-05-16: qty 2

## 2017-05-16 MED ORDER — 0.9 % SODIUM CHLORIDE (POUR BTL) OPTIME
TOPICAL | Status: DC | PRN
  Administered 2017-05-16: 1000 mL

## 2017-05-16 MED ORDER — MIDAZOLAM HCL 5 MG/5ML IJ SOLN
INTRAMUSCULAR | Status: DC | PRN
  Administered 2017-05-16: 2 mg via INTRAVENOUS

## 2017-05-16 MED ORDER — ONDANSETRON HCL 4 MG/2ML IJ SOLN: 4.0000 mg | Freq: Once | INTRAMUSCULAR | Status: DC | PRN

## 2017-05-16 MED ORDER — ALLOPURINOL 300 MG PO TABS
300.0000 mg | ORAL_TABLET | Freq: Every day | ORAL | Status: DC
  Administered 2017-05-16 – 2017-05-20 (×4): 300 mg via ORAL
  Filled 2017-05-16 (×4): qty 1

## 2017-05-16 MED ORDER — ONDANSETRON HCL 4 MG/2ML IJ SOLN
INTRAMUSCULAR | Status: DC | PRN
  Administered 2017-05-16: 4 mg via INTRAVENOUS

## 2017-05-16 MED ORDER — METHOCARBAMOL 1000 MG/10ML IJ SOLN
500.0000 mg | Freq: Four times a day (QID) | INTRAVENOUS | Status: DC | PRN
  Filled 2017-05-16: qty 5

## 2017-05-16 MED ORDER — ACETAMINOPHEN 650 MG RE SUPP: 650.0000 mg | Freq: Four times a day (QID) | RECTAL | Status: DC | PRN

## 2017-05-16 MED ORDER — LORAZEPAM 0.5 MG PO TABS
0.5000 mg | ORAL_TABLET | Freq: Two times a day (BID) | ORAL | Status: DC | PRN
  Administered 2017-05-17: 0.5 mg via ORAL
  Filled 2017-05-16: qty 1

## 2017-05-16 MED ORDER — BUPIVACAINE HCL (PF) 0.5 % IJ SOLN
INTRAMUSCULAR | Status: DC | PRN
  Administered 2017-05-16: 30 mL

## 2017-05-16 MED ORDER — CHLORHEXIDINE GLUCONATE 4 % EX LIQD: 60.0000 mL | Freq: Once | CUTANEOUS | Status: DC

## 2017-05-16 MED ORDER — HYDROMORPHONE HCL 1 MG/ML IJ SOLN
0.5000 mg | INTRAMUSCULAR | Status: DC | PRN
  Administered 2017-05-16 – 2017-05-17 (×2): 1 mg via INTRAVENOUS
  Filled 2017-05-16 (×2): qty 1

## 2017-05-16 MED ORDER — INSULIN ASPART 100 UNIT/ML ~~LOC~~ SOLN
0.0000 [IU] | Freq: Three times a day (TID) | SUBCUTANEOUS | Status: DC
  Administered 2017-05-16: 5 [IU] via SUBCUTANEOUS
  Administered 2017-05-16 – 2017-05-17 (×4): 8 [IU] via SUBCUTANEOUS
  Administered 2017-05-18: 3 [IU] via SUBCUTANEOUS
  Administered 2017-05-18: 5 [IU] via SUBCUTANEOUS
  Administered 2017-05-19: 8 [IU] via SUBCUTANEOUS
  Administered 2017-05-19: 2 [IU] via SUBCUTANEOUS
  Administered 2017-05-20: 3 [IU] via SUBCUTANEOUS

## 2017-05-16 MED ORDER — TIZANIDINE HCL 2 MG PO TABS: 2.0000 mg | ORAL_TABLET | Freq: Three times a day (TID) | ORAL | 0 refills | Status: DC | PRN

## 2017-05-16 MED ORDER — DEXAMETHASONE SODIUM PHOSPHATE 10 MG/ML IJ SOLN
10.0000 mg | Freq: Two times a day (BID) | INTRAMUSCULAR | Status: AC
  Administered 2017-05-16 (×2): 10 mg via INTRAVENOUS
  Filled 2017-05-16 (×2): qty 1

## 2017-05-16 MED ORDER — CLINDAMYCIN PHOSPHATE 600 MG/50ML IV SOLN
600.0000 mg | Freq: Four times a day (QID) | INTRAVENOUS | Status: AC
  Administered 2017-05-16 (×2): 600 mg via INTRAVENOUS
  Filled 2017-05-16 (×3): qty 50

## 2017-05-16 MED ORDER — SODIUM CHLORIDE 0.9 % IV SOLN
INTRAVENOUS | Status: DC
  Administered 2017-05-16: 12:00:00 via INTRAVENOUS

## 2017-05-16 MED ORDER — POTASSIUM CHLORIDE CRYS ER 20 MEQ PO TBCR
20.0000 meq | EXTENDED_RELEASE_TABLET | Freq: Every day | ORAL | Status: DC
  Administered 2017-05-16 – 2017-05-20 (×4): 20 meq via ORAL
  Filled 2017-05-16 (×4): qty 1

## 2017-05-16 MED ORDER — POLYETHYLENE GLYCOL 3350 17 G PO PACK
17.0000 g | PACK | Freq: Every day | ORAL | Status: DC | PRN
  Administered 2017-05-18: 17 g via ORAL
  Filled 2017-05-16: qty 1

## 2017-05-16 MED ORDER — OXYCODONE HCL 5 MG PO TABS
5.0000 mg | ORAL_TABLET | ORAL | Status: DC | PRN
  Administered 2017-05-16 – 2017-05-19 (×8): 10 mg via ORAL
  Administered 2017-05-19: 5 mg via ORAL
  Administered 2017-05-20: 10 mg via ORAL
  Filled 2017-05-16 (×4): qty 2
  Filled 2017-05-16: qty 1
  Filled 2017-05-16 (×6): qty 2

## 2017-05-16 MED ORDER — DEXAMETHASONE SODIUM PHOSPHATE 10 MG/ML IJ SOLN
INTRAMUSCULAR | Status: DC | PRN
  Administered 2017-05-16: 10 mg via INTRAVENOUS

## 2017-05-16 MED ORDER — AMLODIPINE BESYLATE 10 MG PO TABS
10.0000 mg | ORAL_TABLET | Freq: Every day | ORAL | Status: DC
  Administered 2017-05-18 – 2017-05-20 (×3): 10 mg via ORAL
  Filled 2017-05-16 (×4): qty 1

## 2017-05-16 MED ORDER — FENTANYL CITRATE (PF) 100 MCG/2ML IJ SOLN
25.0000 ug | INTRAMUSCULAR | Status: DC | PRN
  Administered 2017-05-16: 50 ug via INTRAVENOUS

## 2017-05-16 MED ORDER — FENTANYL CITRATE (PF) 250 MCG/5ML IJ SOLN
INTRAMUSCULAR | Status: AC
  Filled 2017-05-16: qty 5

## 2017-05-16 MED ORDER — PROMETHAZINE HCL 25 MG/ML IJ SOLN
6.2500 mg | Freq: Four times a day (QID) | INTRAMUSCULAR | Status: DC | PRN
  Administered 2017-05-17: 6.25 mg via INTRAVENOUS
  Filled 2017-05-16: qty 1

## 2017-05-16 MED ORDER — LIDOCAINE 2% (20 MG/ML) 5 ML SYRINGE
INTRAMUSCULAR | Status: AC
  Filled 2017-05-16: qty 5

## 2017-05-16 MED ORDER — DEXAMETHASONE SODIUM PHOSPHATE 10 MG/ML IJ SOLN
INTRAMUSCULAR | Status: AC
  Filled 2017-05-16: qty 1

## 2017-05-16 MED ORDER — ROPIVACAINE HCL 5 MG/ML IJ SOLN
INTRAMUSCULAR | Status: DC | PRN
  Administered 2017-05-16: 30 mL via PERINEURAL

## 2017-05-16 MED ORDER — MAGNESIUM CITRATE PO SOLN: 1.0000 | Freq: Once | ORAL | Status: DC | PRN

## 2017-05-16 MED ORDER — MIDAZOLAM HCL 2 MG/2ML IJ SOLN
INTRAMUSCULAR | Status: AC
  Filled 2017-05-16: qty 2

## 2017-05-16 MED ORDER — LACTATED RINGERS IV SOLN
INTRAVENOUS | Status: DC | PRN
  Administered 2017-05-16 (×2): via INTRAVENOUS

## 2017-05-16 MED ORDER — ONDANSETRON HCL 4 MG/2ML IJ SOLN
4.0000 mg | Freq: Four times a day (QID) | INTRAMUSCULAR | Status: DC | PRN
  Administered 2017-05-16 – 2017-05-20 (×6): 4 mg via INTRAVENOUS
  Filled 2017-05-16 (×6): qty 2

## 2017-05-16 MED ORDER — PROPOFOL 10 MG/ML IV BOLUS
INTRAVENOUS | Status: DC | PRN
  Administered 2017-05-16 (×2): 50 mg via INTRAVENOUS
  Administered 2017-05-16: 150 mg via INTRAVENOUS

## 2017-05-16 MED ORDER — BISACODYL 5 MG PO TBEC
5.0000 mg | DELAYED_RELEASE_TABLET | Freq: Every day | ORAL | Status: DC | PRN
  Administered 2017-05-19: 5 mg via ORAL
  Filled 2017-05-16: qty 1

## 2017-05-16 MED ORDER — METHOCARBAMOL 500 MG PO TABS
500.0000 mg | ORAL_TABLET | Freq: Four times a day (QID) | ORAL | Status: DC | PRN
  Administered 2017-05-16 – 2017-05-19 (×6): 500 mg via ORAL
  Filled 2017-05-16 (×7): qty 1

## 2017-05-16 MED ORDER — BUMETANIDE 0.5 MG PO TABS: 0.5000 mg | ORAL_TABLET | Freq: Every day | ORAL | Status: DC

## 2017-05-16 MED ORDER — FENTANYL CITRATE (PF) 100 MCG/2ML IJ SOLN
INTRAMUSCULAR | Status: AC
  Filled 2017-05-16: qty 2

## 2017-05-16 SURGICAL SUPPLY — 68 items
APL SKNCLS STERI-STRIP NONHPOA (GAUZE/BANDAGES/DRESSINGS) ×1
BANDAGE ELASTIC 6 VELCRO ST LF (GAUZE/BANDAGES/DRESSINGS) ×2 IMPLANT
BANDAGE ESMARK 6X9 LF (GAUZE/BANDAGES/DRESSINGS) ×1 IMPLANT
BENZOIN TINCTURE PRP APPL 2/3 (GAUZE/BANDAGES/DRESSINGS) ×3 IMPLANT
BLADE SAGITTAL 25.0X1.19X90 (BLADE) ×2 IMPLANT
BLADE SAGITTAL 25.0X1.19X90MM (BLADE) ×1
BLADE SAW SAG 90X13X1.27 (BLADE) ×3 IMPLANT
BLADE SURG 10 STRL SS (BLADE) ×2 IMPLANT
BNDG CMPR 9X6 STRL LF SNTH (GAUZE/BANDAGES/DRESSINGS) ×1
BNDG ESMARK 6X9 LF (GAUZE/BANDAGES/DRESSINGS) ×3
BOWL SMART MIX CTS (DISPOSABLE) ×3 IMPLANT
CAPT KNEE TOTAL 3 ATTUNE ×2 IMPLANT
CEMENT HV SMART SET (Cement) ×6 IMPLANT
CLOSURE WOUND 1/2 X4 (GAUZE/BANDAGES/DRESSINGS) ×1
COVER SURGICAL LIGHT HANDLE (MISCELLANEOUS) ×5 IMPLANT
CUFF TOURNIQUET SINGLE 34IN LL (TOURNIQUET CUFF) ×3 IMPLANT
CUFF TOURNIQUET SINGLE 44IN (TOURNIQUET CUFF) IMPLANT
DRAPE EXTREMITY T 121X128X90 (DRAPE) ×3 IMPLANT
DRAPE TABLE BACK 80X90 (DRAPES) ×2 IMPLANT
DRAPE U-SHAPE 47X51 STRL (DRAPES) ×3 IMPLANT
DRSG AQUACEL AG ADV 3.5X10 (GAUZE/BANDAGES/DRESSINGS) ×2 IMPLANT
DRSG PAD ABDOMINAL 8X10 ST (GAUZE/BANDAGES/DRESSINGS) ×3 IMPLANT
DURAPREP 26ML APPLICATOR (WOUND CARE) ×3 IMPLANT
ELECT REM PT RETURN 9FT ADLT (ELECTROSURGICAL) ×3
ELECTRODE REM PT RTRN 9FT ADLT (ELECTROSURGICAL) ×1 IMPLANT
EVACUATOR 1/8 PVC DRAIN (DRAIN) IMPLANT
FACESHIELD WRAPAROUND (MASK) ×3 IMPLANT
FACESHIELD WRAPAROUND OR TEAM (MASK) ×1 IMPLANT
GAUZE SPONGE 4X4 12PLY STRL (GAUZE/BANDAGES/DRESSINGS) ×3 IMPLANT
GLOVE BIOGEL PI IND STRL 8 (GLOVE) ×2 IMPLANT
GLOVE BIOGEL PI INDICATOR 8 (GLOVE) ×4
GLOVE ECLIPSE 7.5 STRL STRAW (GLOVE) ×6 IMPLANT
GOWN STRL REUS W/ TWL LRG LVL3 (GOWN DISPOSABLE) ×1 IMPLANT
GOWN STRL REUS W/ TWL XL LVL3 (GOWN DISPOSABLE) ×2 IMPLANT
GOWN STRL REUS W/TWL LRG LVL3 (GOWN DISPOSABLE) ×3
GOWN STRL REUS W/TWL XL LVL3 (GOWN DISPOSABLE) ×6
HANDPIECE INTERPULSE COAX TIP (DISPOSABLE) ×3
HOOD PEEL AWAY FACE SHEILD DIS (HOOD) ×6 IMPLANT
IMMOBILIZER KNEE 20 (SOFTGOODS) ×3
IMMOBILIZER KNEE 20 THIGH 36 (SOFTGOODS) IMPLANT
IMMOBILIZER KNEE 22 UNIV (SOFTGOODS) ×3 IMPLANT
KIT BASIN OR (CUSTOM PROCEDURE TRAY) ×3 IMPLANT
KIT ROOM TURNOVER OR (KITS) ×3 IMPLANT
MANIFOLD NEPTUNE II (INSTRUMENTS) ×3 IMPLANT
NDL SPNL 22GX3.5 QUINCKE BK (NEEDLE) IMPLANT
NEEDLE 22X1 1/2 (OR ONLY) (NEEDLE) ×3 IMPLANT
NEEDLE SPNL 22GX3.5 QUINCKE BK (NEEDLE) ×3 IMPLANT
NS IRRIG 1000ML POUR BTL (IV SOLUTION) ×3 IMPLANT
PACK TOTAL JOINT (CUSTOM PROCEDURE TRAY) ×3 IMPLANT
PAD ARMBOARD 7.5X6 YLW CONV (MISCELLANEOUS) ×6 IMPLANT
PADDING CAST COTTON 6X4 STRL (CAST SUPPLIES) ×2 IMPLANT
SET HNDPC FAN SPRY TIP SCT (DISPOSABLE) ×1 IMPLANT
STAPLER VISISTAT 35W (STAPLE) IMPLANT
STRIP CLOSURE SKIN 1/2X4 (GAUZE/BANDAGES/DRESSINGS) ×1 IMPLANT
SUCTION FRAZIER HANDLE 10FR (MISCELLANEOUS) ×2
SUCTION TUBE FRAZIER 10FR DISP (MISCELLANEOUS) ×1 IMPLANT
SUT MNCRL AB 3-0 PS2 18 (SUTURE) ×2 IMPLANT
SUT VIC AB 0 CTB1 27 (SUTURE) ×6 IMPLANT
SUT VIC AB 1 CT1 27 (SUTURE) ×6
SUT VIC AB 1 CT1 27XBRD ANBCTR (SUTURE) ×2 IMPLANT
SUT VIC AB 1 CTX 27 (SUTURE) ×4 IMPLANT
SUT VIC AB 2-0 CTB1 (SUTURE) ×6 IMPLANT
SYR 50ML LL SCALE MARK (SYRINGE) ×3 IMPLANT
TOWEL OR 17X24 6PK STRL BLUE (TOWEL DISPOSABLE) ×3 IMPLANT
TOWEL OR 17X26 10 PK STRL BLUE (TOWEL DISPOSABLE) ×3 IMPLANT
TRAY CATH 16FR W/PLASTIC CATH (SET/KITS/TRAYS/PACK) IMPLANT
TRAY FOLEY W/METER SILVER 16FR (SET/KITS/TRAYS/PACK) IMPLANT
WRAP KNEE MAXI GEL POST OP (GAUZE/BANDAGES/DRESSINGS) ×3 IMPLANT

## 2017-05-16 NOTE — Plan of Care (Signed)
Problem: Safety: Goal: Ability to remain free from injury will improve Outcome: Progressing Bed alarm on, patient states she will call out for help when needing to get up.

## 2017-05-16 NOTE — Discharge Instructions (Signed)

## 2017-05-16 NOTE — Evaluation (Signed)
Physical Therapy Evaluation Patient Details Name: Sarah [name] MRN: [id number] DOB: 11/03/1951 Today's Date: 05/16/2017   History of Present Illness  Pt is 66 y/o female s/p elective L TKA. PMH includes DM with renal complications, peripheral neuropathy, HTN, anxiety, neck surgery, ankle surgery and knee arthroscopy.   Clinical Impression  Pt s/p surgery above with deficits below. PTA pt was using cane for ambulation. Upon evaluation, pt limited by post op pain and weakness. Pt requiring min A for stability this session. Reports she will be going SNF at d/c because she will not have assist at home. D/c plan according to MD arrangements. Will continue to follow acutely to maximize functional mobility independence.     Follow Up Recommendations DC plan and follow up therapy as arranged by surgeon;Supervision/Assistance - 24 hour    Equipment Recommendations  None recommended by PT    Recommendations for Other Services       Precautions / Restrictions Precautions Precautions: Knee Precaution Booklet Issued: Yes (comment) Precaution Comments: Reviewed supine ther ex with pt Required Braces or Orthoses: Knee Immobilizer - Left Knee Immobilizer - Left: Other (comment) (until discontinued) Restrictions Weight Bearing Restrictions: Yes LLE Weight Bearing: Weight bearing as tolerated      Mobility  Bed Mobility Overal bed mobility: Needs Assistance Bed Mobility: Supine to Sit;Sit to Supine     Supine to sit: Min assist Sit to supine: Min assist   General bed mobility comments: Min A for trunk elevation and LLE management.   Transfers Overall transfer level: Needs assistance Equipment used: Rolling walker (2 wheeled) Transfers: Sit to/from Stand Sit to Stand: Min assist         General transfer comment: Min A for steadying once standing. Verbal cues for hand placement.   Ambulation/Gait Ambulation/Gait assistance: Min assist Ambulation Distance (Feet): 15 Feet Assistive  device: Rolling walker (2 wheeled) Gait Pattern/deviations: Step-to pattern;Decreased step length - left;Decreased weight shift to left;Antalgic Gait velocity: Decreased Gait velocity interpretation: Below normal speed for age/gender General Gait Details: Slow, antalgic gait. Verbal cues for sequencing with RW and for upright posture. Min A for steadying. Distance limited secondary to pain.   Stairs            Wheelchair Mobility    Modified Rankin (Stroke Patients Only)       Balance Overall balance assessment: Needs assistance Sitting-balance support: No upper extremity supported;Feet supported Sitting balance-Leahy Scale: Good     Standing balance support: Bilateral upper extremity supported;During functional activity Standing balance-Leahy Scale: Poor Standing balance comment: Reliant on RW for stability.                              Pertinent Vitals/Pain Pain Assessment: 0-10 Pain Score: 8  Pain Location: L knee Pain Descriptors / Indicators: Aching;Operative site guarding Pain Intervention(s): Limited activity within patient's tolerance;Monitored during session;Repositioned    Home Living Family/patient expects to be discharged to:: Skilled nursing facility Living Arrangements: Alone                    Prior Function Level of Independence: Independent with assistive device(s)         Comments: USed cane for ambulation      Hand Dominance   Dominant Hand: Right    Extremity/Trunk Assessment   Upper Extremity Assessment Upper Extremity Assessment: Defer to OT evaluation    Lower Extremity Assessment Lower Extremity Assessment: LLE deficits/detail LLE Deficits /  Details: Deficits consistent with post op pain and weakness. Sensory in tact. Able to do exercises below.     Cervical / Trunk Assessment Cervical / Trunk Assessment: Kyphotic  Communication   Communication: No difficulties  Cognition Arousal/Alertness:  Awake/alert Behavior During Therapy: WFL for tasks assessed/performed Overall Cognitive Status: Within Functional Limits for tasks assessed                                        General Comments      Exercises Total Joint Exercises Ankle Circles/Pumps: AROM;Both;10 reps;Supine Quad Sets: AROM;Left;10 reps;Supine Towel Squeeze: AROM;Both;10 reps;Supine Short Arc Quad: AROM;Left;Both;10 reps;Supine Heel Slides: AROM;Left;10 reps;Supine Hip ABduction/ADduction: AROM;Left;10 reps;Supine Straight Leg Raises: AROM;Left;10 reps;Supine   Assessment/Plan    PT Assessment Patient needs continued PT services  PT Problem List Decreased strength;Decreased range of motion;Decreased activity tolerance;Decreased balance;Decreased mobility;Decreased knowledge of use of DME;Decreased knowledge of precautions;Pain       PT Treatment Interventions DME instruction;Gait training;Functional mobility training;Therapeutic activities;Therapeutic exercise;Balance training;Neuromuscular re-education;Patient/family education    PT Goals (Current goals can be found in the Care Plan section)  Acute Rehab PT Goals Patient Stated Goal: to get more independent  PT Goal Formulation: With patient Time For Goal Achievement: 05/23/17 Potential to Achieve Goals: Good    Frequency 7X/week   Barriers to discharge Decreased caregiver support pt lives alone     Co-evaluation               AM-PAC PT "6 Clicks" Daily Activity  Outcome Measure Difficulty turning over in bed (including adjusting bedclothes, sheets and blankets)?: Total Difficulty moving from lying on back to sitting on the side of the bed? : Total Difficulty sitting down on and standing up from a chair with arms (e.g., wheelchair, bedside commode, etc,.)?: Total Help needed moving to and from a bed to chair (including a wheelchair)?: A Little Help needed walking in hospital room?: A Little Help needed climbing 3-5 steps  with a railing? : A Lot 6 Click Score: 11    End of Session Equipment Utilized During Treatment: Gait belt;Left knee immobilizer Activity Tolerance: Patient limited by pain Patient left: in bed;with call bell/phone within reach Nurse Communication: Mobility status PT Visit Diagnosis: Other abnormalities of gait and mobility (R26.89);Pain Pain - Right/Left: Left Pain - part of body: Knee    Time: 1718-1740 PT Time Calculation (min) (ACUTE ONLY): 22 min   Charges:   PT Evaluation $PT Eval Low Complexity: 1 Procedure PT Treatments $Gait Training: 8-22 mins   PT G Codes:        Margot Chimes, PT, DPT  Acute Rehabilitation Services  Pager: [phone]   Melvyn Novas 05/16/2017, 6:58 PM

## 2017-05-16 NOTE — Anesthesia Postprocedure Evaluation (Signed)
Anesthesia Post Note  Patient: Starling MannsMickey L Bernasconi  Procedure(s) Performed: Procedure(s) (LRB): TOTAL KNEE ARTHROPLASTY (Left)     Patient location during evaluation: PACU Anesthesia Type: Regional and General Level of consciousness: awake and alert Pain management: pain level controlled Vital Signs Assessment: post-procedure vital signs reviewed and stable Respiratory status: spontaneous breathing, nonlabored ventilation, respiratory function stable and patient connected to nasal cannula oxygen Cardiovascular status: blood pressure returned to baseline and stable Postop Assessment: no signs of nausea or vomiting Anesthetic complications: no    Last Vitals:  Vitals:   05/16/17 1130 05/16/17 1151  BP:  (!) 120/51  Pulse:  (!) 51  Resp:  16  Temp: 36.7 C     Last Pain:  Vitals:   05/16/17 1150  TempSrc:   PainSc: 4                  Ameliyah Sarno P Kami Kube

## 2017-05-16 NOTE — Anesthesia Procedure Notes (Signed)
Procedure Name: Intubation Date/Time: 05/16/2017 8:17 AM Performed by: Valda Favia Pre-anesthesia Checklist: Patient identified, Emergency Drugs available, Suction available, Patient being monitored and Timeout performed Patient Re-evaluated:Patient Re-evaluated prior to inductionOxygen Delivery Method: Circle system utilized Preoxygenation: Pre-oxygenation with 100% oxygen Intubation Type: IV induction Ventilation: Mask ventilation without difficulty and Oral airway inserted - appropriate to patient size Laryngoscope Size: Mac and 4 Grade View: Grade I Tube type: Oral Tube size: 7.0 mm Number of attempts: 1 Airway Equipment and Method: Stylet Placement Confirmation: ETT inserted through vocal cords under direct vision,  positive ETCO2 and breath sounds checked- equal and bilateral Secured at: 22 cm Tube secured with: Tape Dental Injury: Bloody posterior oropharynx  Comments: Bloody posterior oropharynx prior to direct laryngoscopy.

## 2017-05-16 NOTE — Progress Notes (Signed)
Orthopedic Tech Progress Note Patient Details:  Sarah [name] 01/02/1951 [id number]  CPM Left Knee CPM Left Knee: On Left Knee Flexion (Degrees): 90 Left Knee Extension (Degrees): 0 Additional Comments: trapeze bar patient helper   Nikki DomCrawford, [place] Luddy 05/16/2017, 10:43 AM Viewed order from doctor's order list

## 2017-05-16 NOTE — Transfer of Care (Signed)
Immediate Anesthesia Transfer of Care Note  Patient: Sarah MannsMickey L Ruark  Procedure(s) Performed: Procedure(s): TOTAL KNEE ARTHROPLASTY (Left)  Patient Location: PACU  Anesthesia Type:GA combined with regional for post-op pain  Level of Consciousness: sedated  Airway & Oxygen Therapy: Patient Spontanous Breathing and Patient connected to nasal cannula oxygen  Post-op Assessment: Report given to RN and Post -op Vital signs reviewed and stable  Post vital signs: Reviewed and stable  Last Vitals:  Vitals:   05/16/17 0727 05/16/17 1012  BP:    Pulse: 72   Resp: 14   Temp:  (!) 36 C    Last Pain:  Vitals:   05/16/17 1012  TempSrc:   PainSc: Asleep         Complications: No apparent anesthesia complications

## 2017-05-16 NOTE — Anesthesia Procedure Notes (Signed)
Procedure Name: LMA Insertion Date/Time: 05/16/2017 7:59 AM Performed by: Samara DeistBECKNER, Daylynn Stumpp B Pre-anesthesia Checklist: Patient identified, Emergency Drugs available, Suction available, Patient being monitored and Timeout performed Patient Re-evaluated:Patient Re-evaluated prior to inductionOxygen Delivery Method: Circle system utilized Preoxygenation: Pre-oxygenation with 100% oxygen Intubation Type: IV induction LMA: LMA inserted LMA Size: 4.0 Placement Confirmation: positive ETCO2 and breath sounds checked- equal and bilateral Tube secured with: Tape Dental Injury: Teeth and Oropharynx as per pre-operative assessment

## 2017-05-16 NOTE — Anesthesia Procedure Notes (Signed)
Anesthesia Regional Block: Adductor canal block   Pre-Anesthetic Checklist: ,, timeout performed, Correct Patient, Correct Site, Correct Laterality, Correct Procedure,, site marked, risks and benefits discussed, Surgical consent,  Pre-op evaluation,  At surgeon's request and post-op pain management  Laterality: Left  Prep: chloraprep       Needles:  Injection technique: Single-shot  Needle Type: Echogenic Stimulator Needle     Needle Length: 9cm  Needle Gauge: 21     Additional Needles:   Procedures: ultrasound guided,,,,,,,,  Narrative:  Start time: 05/16/2017 7:10 AM End time: 05/16/2017 7:20 AM Injection made incrementally with aspirations every 5 mL.  Performed by: Personally  Anesthesiologist: Karna ChristmasELLENDER, Zuleyka Kloc P  Additional Notes: Functioning IV was confirmed and monitors were applied.  A 90mm 21ga Arrow echogenic stimulator needle was used. Sterile prep,hand hygiene and sterile gloves were used.  Negative aspiration and negative test dose prior to incremental administration of local anesthetic. The patient tolerated the procedure well.

## 2017-05-17 LAB — GLUCOSE, CAPILLARY
GLUCOSE-CAPILLARY: 261 mg/dL — AB (ref 65–99)
Glucose-Capillary: 256 mg/dL — ABNORMAL HIGH (ref 65–99)
Glucose-Capillary: 169 mg/dL — ABNORMAL HIGH (ref 65–99)

## 2017-05-17 LAB — BASIC METABOLIC PANEL
Anion gap: 13 (ref 5–15)
BUN: 38 mg/dL — ABNORMAL HIGH (ref 6–20)
CALCIUM: 8.4 mg/dL — AB (ref 8.9–10.3)
CHLORIDE: 101 mmol/L (ref 101–111)
CO2: 17 mmol/L — ABNORMAL LOW (ref 22–32)
CREATININE: 3.15 mg/dL — AB (ref 0.44–1.00)
GFR calc non Af Amer: 14 mL/min — ABNORMAL LOW (ref >60)
GFR, EST AFRICAN AMERICAN: 17 mL/min — AB (ref >60)
GLUCOSE: 249 mg/dL — AB (ref 65–99)
Potassium: 3.8 mmol/L (ref 3.5–5.1)
Sodium: 131 mmol/L — ABNORMAL LOW (ref 135–145)

## 2017-05-17 LAB — CBC
HEMATOCRIT: 30.7 % — AB (ref 36.0–46.0)
HEMOGLOBIN: 10.3 g/dL — AB (ref 12.0–15.0)
MCH: 26.1 pg (ref 26.0–34.0)
MCHC: 33.6 g/dL (ref 30.0–36.0)
MCV: 77.9 fL — AB (ref 78.0–100.0)
Platelets: 306 10*3/uL (ref 150–400)
RBC: 3.94 MIL/uL (ref 3.87–5.11)
RDW: 14.2 % (ref 11.5–15.5)
WBC: 13.1 10*3/uL — ABNORMAL HIGH (ref 4.0–10.5)

## 2017-05-17 NOTE — Progress Notes (Signed)
Physical Therapy Treatment Patient Details Name: Sarah [name] MRN: [id number] DOB: 11/15/1951 Today's Date: 05/17/2017    History of Present Illness Pt is 66 y/o female s/p elective L TKA. PMH includes DM with renal complications, peripheral neuropathy, HTN, anxiety, neck surgery, ankle surgery and knee arthroscopy.     PT Comments    Pt performed increased gait and functional mobility during session.  Pt with increased pain and anxiety post gait, RN gave IV pain meds and ativan for anxiety.  Pt with improved demeanor post administration of meds. Plan next session to increase knee flexion and gait distance.    Follow Up Recommendations  DC plan and follow up therapy as arranged by surgeon;Supervision/Assistance - 24 hour     Equipment Recommendations  None recommended by PT    Recommendations for Other Services       Precautions / Restrictions Precautions Precautions: Knee Precaution Booklet Issued: Yes (comment) Precaution Comments: Reviewed supine ther ex with pt Restrictions Weight Bearing Restrictions: Yes LLE Weight Bearing: Weight bearing as tolerated    Mobility  Bed Mobility Overal bed mobility: Needs Assistance             General bed mobility comments: Min A for trunk elevation and LLE management.   Transfers Overall transfer level: Needs assistance Equipment used: Rolling walker (2 wheeled) Transfers: Sit to/from Stand Sit to Stand: Min assist         General transfer comment: Cues for hand placement to and from seated surface.  Cues for forward advancement of LLE.    Ambulation/Gait Ambulation/Gait assistance: Min assist Ambulation Distance (Feet): 45 Feet Assistive device: Rolling walker (2 wheeled) Gait Pattern/deviations: Step-to pattern;Decreased stride length;Antalgic;Trunk flexed Gait velocity: Decreased Gait velocity interpretation: Below normal speed for age/gender General Gait Details: Cues to keep hand placement on RW for safety.  Pt  performed increased gait and required cues for advancing gait and maintaining focus on activity.     Stairs            Wheelchair Mobility    Modified Rankin (Stroke Patients Only)       Balance Overall balance assessment: Needs assistance Sitting-balance support: No upper extremity supported;Feet supported Sitting balance-Leahy Scale: Good       Standing balance-Leahy Scale: Poor                              Cognition Arousal/Alertness: Awake/alert Behavior During Therapy: WFL for tasks assessed/performed Overall Cognitive Status: Within Functional Limits for tasks assessed                                        Exercises Total Joint Exercises Ankle Circles/Pumps: AROM;Both;10 reps;Supine Quad Sets: AROM;Left;10 reps;Supine Towel Squeeze: AROM;Both;10 reps;Supine Hip ABduction/ADduction: AAROM;Left;10 reps;Supine Straight Leg Raises: AAROM;Left;10 reps;Supine Goniometric ROM: 55 degrees L knee flexion.      General Comments        Pertinent Vitals/Pain Pain Assessment: 0-10 Pain Score: 10-Worst pain ever Pain Location: L knee and R arm ( reports spasm in R arm with noticeable tremor) Pain Descriptors / Indicators: Burning;Cramping;Discomfort;Grimacing;Throbbing Pain Intervention(s): Monitored during session;Repositioned;Ice applied    Home Living                      Prior Function            PT  Goals (current goals can now be found in the care plan section) Acute Rehab PT Goals Patient Stated Goal: to get more independent  Potential to Achieve Goals: Good Progress towards PT goals: Progressing toward goals    Frequency    7X/week      PT Plan Current plan remains appropriate    Co-evaluation              AM-PAC PT "6 Clicks" Daily Activity  Outcome Measure  Difficulty turning over in bed (including adjusting bedclothes, sheets and blankets)?: Total Difficulty moving from lying on back to  sitting on the side of the bed? : Total Difficulty sitting down on and standing up from a chair with arms (e.g., wheelchair, bedside commode, etc,.)?: Total Help needed moving to and from a bed to chair (including a wheelchair)?: A Lot Help needed walking in hospital room?: A Lot Help needed climbing 3-5 steps with a railing? : A Lot 6 Click Score: 9    End of Session Equipment Utilized During Treatment: Gait belt Activity Tolerance: Patient limited by pain Patient left: in bed;with call bell/phone within reach Nurse Communication: Mobility status PT Visit Diagnosis: Other abnormalities of gait and mobility (R26.89);Pain Pain - Right/Left: Left Pain - part of body: Knee     Time: 1400-1445 PT Time Calculation (min) (ACUTE ONLY): 45 min  Charges:  $Gait Training: 8-22 mins $Therapeutic Exercise: 8-22 mins $Therapeutic Activity: 8-22 mins                    G Codes:       Joycelyn Rua, PTA pager [phone]    Florestine Avers 05/17/2017, 2:49 PM

## 2017-05-17 NOTE — Progress Notes (Signed)
Subjective: 1 Day Post-Op Procedure(s) (LRB): TOTAL KNEE ARTHROPLASTY (Left) Patient reports pain as moderate.  Had nausea and vomiting overnight last night. Better this morning. Voiding okay. Taking by mouth okay. She is sitting up in chair.  Objective: Vital signs in last 24 hours: Temp:  [96.8 F (36 C)-98.3 F (36.8 C)] 97.6 F (36.4 C) (06/02 0254) Pulse Rate:  [44-84] 73 (06/02 0254) Resp:  [11-19] 16 (06/02 0254) BP: (103-154)/(48-93) 154/66 (06/02 0254) SpO2:  [90 %-100 %] 100 % (06/02 0254)  Intake/Output from previous day: 06/01 0701 - 06/02 0700 In: 2603.3 [P.O.:730; I.V.:1823.3; IV Piggyback:50] Out: 400 [Urine:350; Blood:50] Intake/Output this shift: No intake/output data recorded.   Recent Labs  05/17/17 0618  HGB 10.3*    Recent Labs  05/17/17 0618  WBC 13.1*  RBC 3.94  HCT 30.7*  PLT 306    Recent Labs  05/17/17 0618  NA 131*  K 3.8  CL 101  CO2 17*  BUN 38*  CREATININE 3.15*  GLUCOSE 249*  CALCIUM 8.4*  Left knee exam: Neurovascular intact Sensation intact distally Incision: dressing C/D/I Compartment soft  Assessment/Plan: 1 Day Post-Op Procedure(s) (LRB): TOTAL KNEE ARTHROPLASTY (Left) Plan: Weight-bear as tolerated on left. Up with physical therapy. Will probably need skilled nursing facility. Continue aspirin 325 mg twice daily for DVT prophylaxis 1 month postop. Up with therapy Discharge to SNF possibly tomorrow or Monday.   Jolyne Laye G 05/17/2017, 8:55 AM

## 2017-05-18 LAB — BASIC METABOLIC PANEL
ANION GAP: 12 (ref 5–15)
BUN: 52 mg/dL — ABNORMAL HIGH (ref 6–20)
CALCIUM: 8.6 mg/dL — AB (ref 8.9–10.3)
CO2: 22 mmol/L (ref 22–32)
Chloride: 101 mmol/L (ref 101–111)
Creatinine, Ser: 3.29 mg/dL — ABNORMAL HIGH (ref 0.44–1.00)
GFR calc Af Amer: 16 mL/min — ABNORMAL LOW (ref >60)
GFR calc non Af Amer: 14 mL/min — ABNORMAL LOW (ref >60)
GLUCOSE: 129 mg/dL — AB (ref 65–99)
Potassium: 3.4 mmol/L — ABNORMAL LOW (ref 3.5–5.1)
Sodium: 135 mmol/L (ref 135–145)

## 2017-05-18 LAB — CBC
HEMATOCRIT: 29.6 % — AB (ref 36.0–46.0)
Hemoglobin: 9.8 g/dL — ABNORMAL LOW (ref 12.0–15.0)
MCH: 25.7 pg — ABNORMAL LOW (ref 26.0–34.0)
MCHC: 33.1 g/dL (ref 30.0–36.0)
MCV: 77.5 fL — ABNORMAL LOW (ref 78.0–100.0)
Platelets: 309 10*3/uL (ref 150–400)
RBC: 3.82 MIL/uL — ABNORMAL LOW (ref 3.87–5.11)
RDW: 14.4 % (ref 11.5–15.5)
WBC: 13.6 10*3/uL — AB (ref 4.0–10.5)

## 2017-05-18 LAB — GLUCOSE, CAPILLARY
GLUCOSE-CAPILLARY: 101 mg/dL — AB (ref 65–99)
GLUCOSE-CAPILLARY: 161 mg/dL — AB (ref 65–99)
GLUCOSE-CAPILLARY: 208 mg/dL — AB (ref 65–99)
GLUCOSE-CAPILLARY: 223 mg/dL — AB (ref 65–99)
Glucose-Capillary: 145 mg/dL — ABNORMAL HIGH (ref 65–99)

## 2017-05-18 MED ORDER — SODIUM CHLORIDE 0.9 % IV SOLN
INTRAVENOUS | Status: DC
Start: 2017-05-18 — End: 2017-05-20
  Administered 2017-05-18 – 2017-05-19 (×3): via INTRAVENOUS
  Administered 2017-05-19: 125 mL/h via INTRAVENOUS

## 2017-05-18 NOTE — NC FL2 (Signed)
[place] MEDICAID FL2 LEVEL OF CARE SCREENING TOOL     IDENTIFICATION  Patient Name: Sarah [name] Birthdate: 01-21-1951 Sex: female Admission Date (Current Location): 05/16/2017  [facility] and [place] Number:  Producer, television/film/video and Address:  [facility], [address], [place], [place] [address]      Provider Number: [id number]  Attending Physician Name and Address:  Jodi Geralds, MD  Relative Name and Phone Number:       Current Level of Care: Hospital Recommended Level of Care: Skilled Nursing Facility Prior Approval Number:    Date Approved/Denied:   PASRR Number:    Discharge Plan: SNF    Current Diagnoses: Patient Active Problem List   Diagnosis Date Noted  . Primary osteoarthritis of left knee 05/16/2017  . Disorder of kidney and ureter 06/29/2009  . ANXIETY 07/01/2008  . Diabetes mellitus with renal complications (HCC) 05/22/2007  . Pure hypercholesterolemia 05/22/2007  . Essential hypertension 05/22/2007  . History of cardiovascular disorder 05/22/2007  . ARTHROSCOPY, KNEE, HX OF 05/22/2007    Orientation RESPIRATION BLADDER Height & Weight     Self, Time, Situation, Place  Normal Continent Weight: 179 lb 0.2 oz (81.2 kg) Height:  5\' 7"  (170.2 cm)  BEHAVIORAL SYMPTOMS/MOOD NEUROLOGICAL BOWEL NUTRITION STATUS      Continent Diet (carb modified)  AMBULATORY STATUS COMMUNICATION OF NEEDS Skin   Limited Assist Verbally Surgical wounds                       Personal Care Assistance Level of Assistance  Bathing, Feeding, Dressing Bathing Assistance: Limited assistance Feeding assistance: Independent Dressing Assistance: Limited assistance     Functional Limitations Info  Sight, Hearing, Speech Sight Info: Adequate Hearing Info: Adequate Speech Info: Adequate    SPECIAL CARE FACTORS FREQUENCY  PT (By licensed PT), OT (By licensed OT)     PT Frequency:  (5x/week) OT Frequency:  (5x/week)             Contractures Contractures Info: Not present    Additional Factors Info  Code Status, Allergies, Insulin Sliding Scale (full) Code Status Info:  (full) Allergies Info:  (metoprolol, cefaclor, tetanus toxoid absorbed)   Insulin Sliding Scale Info:  (3x daily with meals)       Current Medications (05/18/2017):  This is the current hospital active medication list Current Facility-Administered Medications  Medication Dose Route Frequency Provider Last Rate Last Dose  . 0.9 %  sodium chloride infusion   Intravenous Continuous Marshia Ly, PA-C 100 mL/hr at 05/16/17 1147    . 0.9 %  sodium chloride infusion   Intravenous Continuous Marshia Ly, PA-C 125 mL/hr at 05/18/17 1030    . acetaminophen (TYLENOL) tablet 650 mg  650 mg Oral Q6H PRN Marshia Ly, PA-C       Or  . acetaminophen (TYLENOL) suppository 650 mg  650 mg Rectal Q6H PRN Marshia Ly, PA-C      . allopurinol (ZYLOPRIM) tablet 300 mg  300 mg Oral Daily Marshia Ly, PA-C   300 mg at 05/18/17 0920  . alum & mag hydroxide-simeth (MAALOX/MYLANTA) 200-200-20 MG/5ML suspension 30 mL  30 mL Oral Q4H PRN Marshia Ly, PA-C      . amLODipine (NORVASC) tablet 10 mg  10 mg Oral Daily Marshia Ly, PA-C   10 mg at 05/18/17 8119  . aspirin EC tablet 325 mg  325 mg Oral BID PC Marshia Ly, PA-C   325 mg at 05/18/17  0920  . atorvastatin (LIPITOR) tablet 80 mg  80 mg Oral q1800 Marshia LyBethune, James, PA-C   80 mg at 05/16/17 1717  . bisacodyl (DULCOLAX) EC tablet 5 mg  5 mg Oral Daily PRN Marshia LyBethune, James, PA-C      . diphenhydrAMINE (BENADRYL) 12.5 MG/5ML elixir 12.5-25 mg  12.5-25 mg Oral Q4H PRN Marshia LyBethune, James, PA-C      . docusate sodium (COLACE) capsule 100 mg  100 mg Oral BID Marshia LyBethune, James, PA-C   100 mg at 05/18/17 0920  . gabapentin (NEURONTIN) capsule 300 mg  300 mg Oral BID Marshia LyBethune, James, PA-C   300 mg at 05/18/17 0920  . HYDROmorphone (DILAUDID) injection 0.5-1 mg  0.5-1 mg Intravenous Q3H PRN Marshia LyBethune, James, PA-C   1 mg  at 05/17/17 1425  . insulin aspart (novoLOG) injection 0-15 Units  0-15 Units Subcutaneous TID WC Marshia LyBethune, James, PA-C   3 Units at 05/18/17 1229  . insulin glargine (LANTUS) injection 30 Units  30 Units Subcutaneous QHS Marshia LyBethune, James, PA-C   30 Units at 05/17/17 2124  . LORazepam (ATIVAN) tablet 0.5 mg  0.5 mg Oral BID PRN Marshia LyBethune, James, PA-C   0.5 mg at 05/17/17 1425  . magnesium citrate solution 1 Bottle  1 Bottle Oral Once PRN Marshia LyBethune, James, PA-C      . methocarbamol (ROBAXIN) tablet 500 mg  500 mg Oral Q6H PRN Marshia LyBethune, James, PA-C   500 mg at 05/18/17 0556   Or  . methocarbamol (ROBAXIN) 500 mg in dextrose 5 % 50 mL IVPB  500 mg Intravenous Q6H PRN Marshia LyBethune, James, PA-C      . ondansetron [facility](ZOFRAN) tablet 4 mg  4 mg Oral Q6H PRN Marshia LyBethune, James, PA-C       Or  . ondansetron [facility](ZOFRAN) injection 4 mg  4 mg Intravenous Q6H PRN Marshia LyBethune, James, PA-C   4 mg at 05/16/17 2231  . oxyCODONE (Oxy IR/ROXICODONE) immediate release tablet 5-10 mg  5-10 mg Oral Q3H PRN Marshia LyBethune, James, PA-C   10 mg at 05/18/17 1229  . polyethylene glycol (MIRALAX / GLYCOLAX) packet 17 g  17 g Oral Daily PRN Marshia LyBethune, James, PA-C      . potassium chloride SA (K-DUR,KLOR-CON) CR tablet 20 mEq  20 mEq Oral Daily Marshia LyBethune, James, PA-C   20 mEq at 05/18/17 0920  . promethazine (PHENERGAN) injection 6.25 mg  6.25 mg Intravenous Q6H PRN Marshia LyBethune, James, PA-C   6.25 mg at 05/17/17 [id number]     Discharge Medications: Please see discharge summary for a list of discharge medications.  Relevant Imaging Results:  Relevant Lab Results:   Additional Information    Darrion Macaulay M, LCSW

## 2017-05-18 NOTE — Progress Notes (Signed)
Patient experienced trouble emptying bladder completely.  Voided 100 independently, some incontinence on bad.  Repeat post-void bladder scan 400.  Intermittent cath =47700ml.  Patient reported relief.  Will continue to monitor.

## 2017-05-18 NOTE — Progress Notes (Signed)
Physical Therapy Treatment Patient Details Name: Sarah MannsMickey L [name] MRN: [id number] DOB: 04/13/1951 Today's Date: 05/18/2017    History of Present Illness Pt is 66 y/o female s/p elective L TKA. PMH includes DM with renal complications, peripheral neuropathy, HTN, anxiety, neck surgery, ankle surgery and knee arthroscopy.     PT Comments    Pre-medicated for today's session. Pt lethargic but able to participate. Continual cueing required to keep on task. Pt ambulated 80 feet with RW. Increased flexion noted L knee.   Follow Up Recommendations  DC plan and follow up therapy as arranged by surgeon;Supervision/Assistance - 24 hour     Equipment Recommendations  None recommended by PT    Recommendations for Other Services       Precautions / Restrictions Precautions Precautions: Knee Restrictions LLE Weight Bearing: Weight bearing as tolerated    Mobility  Bed Mobility         Supine to sit: Min assist     General bed mobility comments: assist with LLE  Transfers   Equipment used: Rolling walker (2 wheeled)   Sit to Stand: Min guard         General transfer comment: increased time to stabilize initial standing balance  Ambulation/Gait Ambulation/Gait assistance: Min guard Ambulation Distance (Feet): 80 Feet Assistive device: Rolling walker (2 wheeled) Gait Pattern/deviations: Antalgic;Decreased stride length;Step-to pattern;Trunk flexed Gait velocity: Decreased Gait velocity interpretation: Below normal speed for age/gender General Gait Details: verbal cues for posture and sequencing   Stairs            Wheelchair Mobility    Modified Rankin (Stroke Patients Only)       Balance                                            Cognition Arousal/Alertness: Lethargic;Suspect due to medications Behavior During Therapy: [facility] PsWFL for tasks assessed/performed Overall Cognitive Status: Within Functional Limits for tasks assessed                                  General Comments: groggy, continual cueing needed to stay awake. Pt received pain meds 30 min prior to Rx.      Exercises Total Joint Exercises Ankle Circles/Pumps: AROM;Both;10 reps Quad Sets: AROM;Left;10 reps Heel Slides: AROM;Left;10 reps Hip ABduction/ADduction: AROM;Left;10 reps Goniometric ROM: 5-75 degrees L knee    General Comments        Pertinent Vitals/Pain Pain Assessment: 0-10 Pain Score: 4  Pain Location: L knee Pain Descriptors / Indicators: Sore Pain Intervention(s): Monitored during session;Ice applied;Repositioned    Home Living                      Prior Function            PT Goals (current goals can now be found in the care plan section) Acute Rehab PT Goals Patient Stated Goal: to get more independent  PT Goal Formulation: With patient Time For Goal Achievement: 05/23/17 Potential to Achieve Goals: Good Progress towards PT goals: Progressing toward goals    Frequency    7X/week      PT Plan Current plan remains appropriate    Co-evaluation              AM-PAC PT "6 Clicks" Daily Activity  Outcome Measure  Difficulty  turning over in bed (including adjusting bedclothes, sheets and blankets)?: Total Difficulty moving from lying on back to sitting on the side of the bed? : Total Difficulty sitting down on and standing up from a chair with arms (e.g., wheelchair, bedside commode, etc,.)?: Total Help needed moving to and from a bed to chair (including a wheelchair)?: A Little Help needed walking in hospital room?: A Little Help needed climbing 3-5 steps with a railing? : A Lot 6 Click Score: 11    End of Session Equipment Utilized During Treatment: Gait belt Activity Tolerance: Patient tolerated treatment well Patient left: in chair;with call bell/phone within reach Nurse Communication: Mobility status PT Visit Diagnosis: Other abnormalities of gait and mobility (R26.89);Pain Pain -  Right/Left: Left Pain - part of body: Knee     Time: 1310-1336 PT Time Calculation (min) (ACUTE ONLY): 26 min  Charges:  $Gait Training: 8-22 mins $Therapeutic Exercise: 8-22 mins                    G Codes:       Aida Raider, PT  Office # [phone] Pager [phone]    Ilda Foil 05/18/2017, 1:45 PM

## 2017-05-18 NOTE — Progress Notes (Signed)
Orthopedic Tech Progress Note Patient Details:  Starling MannsMickey L Wanner 05/03/1951 [id number]  CPM Left Knee CPM Left Knee: On Left Knee Flexion (Degrees): 35 Left Knee Extension (Degrees): 0 Additional Comments:  (pt was in a lot of pain. just got pain meds.)   Saul FordyceJennifer C Zay Yeargan 05/18/2017, 3:04 PM

## 2017-05-18 NOTE — Brief Op Note (Signed)
05/16/2017  8:11 AM  PATIENT:  Sarah [name]  66 y.o. female  PRE-OPERATIVE DIAGNOSIS:  OSTEOARTHRITIS LEFT KNEE  POST-OPERATIVE DIAGNOSIS:  OSTEOARTHRITIS LEFT KNEE  PROCEDURE:  Procedure(s): TOTAL KNEE ARTHROPLASTY (Left)  SURGEON:  Surgeon(s) and Role:    Jodi Geralds* Neidy Guerrieri, MD - Primary  PHYSICIAN ASSISTANT:   ASSISTANTS: jim bethune PAC   ANESTHESIA:   general  EBL:  No intake/output data recorded.  BLOOD ADMINISTERED:none  DRAINS: none   LOCAL MEDICATIONS USED:  MARCAINE    and OTHER experel  SPECIMEN:  No Specimen  DISPOSITION OF SPECIMEN:  N/A  COUNTS:  YES  TOURNIQUET:   Total Tourniquet Time Documented: Thigh (Left) - 66 minutes Total: Thigh (Left) - 66 minutes   DICTATION: .Other Dictation: Dictation Number [id number]  PLAN OF CARE: Admit to inpatient   PATIENT DISPOSITION:  PACU - hemodynamically stable.   Delay start of Pharmacological VTE agent (>24hrs) due to surgical blood loss or risk of bleeding: no

## 2017-05-18 NOTE — Clinical Social Work Placement (Addendum)
   CLINICAL SOCIAL WORK PLACEMENT  NOTE  Date:  05/18/2017  Patient Details  Name: Sarah [name] MRN: [id number] Date of Birth: 03/01/1951  Clinical Social Work is seeking post-discharge placement for this patient at the Skilled  Nursing Facility level of care (*CSW will initial, date and re-position this form in  chart as items are completed):  Yes   Patient/family provided with [place] Clinical Social Work Department's list of facilities offering this level of care within the geographic area requested by the patient (or if unable, by the patient's family).  Yes   Patient/family informed of their freedom to choose among providers that offer the needed level of care, that participate in Medicare, Medicaid or managed care program needed by the patient, have an available bed and are willing to accept the patient.  Yes   Patient/family informed of [facility] [place]'s ownership interest in [facility] and [facility], as well as of the fact that they are under no obligation to receive care at these facilities.  PASRR submitted to EDS on (S) 05/18/17     PASRR number received on 05/18/17     Existing PASRR number confirmed on       FL2 transmitted to all facilities in geographic area requested by pt/family on 05/18/17     FL2 transmitted to all facilities within larger geographic area on       Patient informed that his/her managed care company has contracts with or will negotiate with certain facilities, including the following:          Yes  Patient/family informed of bed offers received.  [facility]   Physician recommends and patient chooses bed at       [facility]   [facility]  [facility].  [facility]    Patient family notified on   05/20/17 of transfer.  Name of family member notified:     Pt indicated that she already called Christy to advise of transport to facility.  PHYSICIAN        Additional Comment:    _______________________________________________ Linna CapriceRAKE, JODY M, LCSW 05/18/2017, 2:53 PM

## 2017-05-18 NOTE — Progress Notes (Signed)
Patient has not voided, bladder scan performed.  Volume of recorded.  Patient wants to try to drink more water and try to void in 30 minutes to 1 hour.  Will make MD/PA aware.  Will continue to monitor.

## 2017-05-18 NOTE — Clinical Social Work Note (Signed)
Clinical Social Work Assessment  Patient Details  Name: Sarah [name] MRN: [id number] Date of Birth: October 17, 1951  Date of referral:  05/18/17               Reason for consult:  Facility Placement                Permission sought to share information with:  Facility Art therapist granted to share information::  Yes, Verbal Permission Granted  Name::        Agency::     Relationship::     Contact Information:     Housing/Transportation Living arrangements for the past 2 months:  Apartment Source of Information:  Patient Patient Interpreter Needed:  None Criminal Activity/Legal Involvement Pertinent to Current Situation/Hospitalization:  No - Comment as needed Significant Relationships:  Other Family Members, Pets Lives with:  Self, Pets Do you feel safe going back to the place where you live?  Yes Need for family participation in patient care:  No (Coment)  Care giving concerns:  Pt lives alone and will need to return to his PLOF before returning home. Social Worker assessment / plan:  CSW met with pt to explain role of CSW/d/c planning.  Pt lived alone and was independent with ADLs PTA, PT now recommending SNF at d/c. SNF placement process explained and pt is agreeable to SNF bed search in the [place] area.  CSW to begin search and facilitate NH tx as appropriate.  Pt prefers [facility], as it is close to his house and he has visited there in the past.  Employment status:  Disabled (Comment on whether or not currently receiving Disability), Retired Nurse, adult PT Recommendations:  [place] / Referral to community resources:     Patient/Family's Response to care: Agreeable to short SNF stay prior to returning home, and is appreciate of CSW intervention.  Patient/Family's Understanding of and Emotional Response to Diagnosis, Current Treatment, and Prognosis: Good.  Pt realizes that since he lives alone,  he will need to be independent prior to returning home, as he has limited support from family members. Emotional Assessment Appearance:  Appears stated age Attitude/Demeanor/Rapport:  Other (cooperative) Affect (typically observed):  Calm, Adaptable, Pleasant Orientation:  Oriented to Self, Oriented to Place, Oriented to  Time, Oriented to Situation Alcohol / Substance use:  Not Applicable Psych involvement (Current and /or in the community):  No (Comment)  Discharge Needs  Concerns to be addressed:  Discharge Planning Concerns Readmission within the last 30 days:  No Current discharge risk:  None Barriers to Discharge:  Ship broker, Continued Medical Work up   [employer], Miachel Roux, LCSW 05/18/2017, 2:56 PM

## 2017-05-18 NOTE — Progress Notes (Signed)
Subjective: 2 Days Post-Op Procedure(s) (LRB): TOTAL KNEE ARTHROPLASTY (Left) Patient reports pain as moderate.  Ambulated 45 feet yesterday with physical therapy. She reports that she is voiding okay and eating okay.  Objective: Vital signs in last 24 hours: Temp:  [97.7 F (36.5 C)-98.1 F (36.7 C)] 97.7 F (36.5 C) (06/03 0618) Pulse Rate:  [69-83] 69 (06/03 0920) Resp:  [16] 16 (06/02 1500) BP: (138-164)/(64-87) 164/74 (06/03 0920) SpO2:  [97 %-98 %] 98 % (06/03 0618) Weight:  [81.2 kg (179 lb 0.2 oz)] 81.2 kg (179 lb 0.2 oz) (06/03 0100)  Intake/Output from previous day: 06/02 0701 - 06/03 0700 In: 500 [P.O.:500] Out: -  Intake/Output this shift: No intake/output data recorded.   Recent Labs  05/17/17 0618 05/18/17 0553  HGB 10.3* 9.8*    Recent Labs  05/17/17 0618 05/18/17 0553  WBC 13.1* 13.6*  RBC 3.94 3.82*  HCT 30.7* 29.6*  PLT 306 309    Recent Labs  05/17/17 0618 05/18/17 0553  NA 131* 135  K 3.8 3.4*  CL 101 101  CO2 17* 22  BUN 38* 52*  CREATININE 3.15* 3.29*  GLUCOSE 249* 129*  CALCIUM 8.4* 8.6*   No results for input(s): LABPT, INR in the last 72 hours. Left knee exam: Dressing clean and dry. Calf soft. Nontender. Neurovascular intact Sensation intact distally Intact pulses distally Dorsiflexion/Plantar flexion intact Compartment soft  Assessment/Plan: 2 Days Post-Op Procedure(s) (LRB): TOTAL KNEE ARTHROPLASTY (Left) Renal insufficiency: Plan: We will hydrate the patient with IV fluid and recheck her bmet tomorrow. If creatinine is still elevated will get official renal consult.Spoke to one of the renal PAs. I will hold Lasix today. Up with therapy Discharge to SNF tomorrow. Continue aspirin 325 mg twice daily for DVT prophylaxis.  Sarah [name] G 05/18/2017, 10:20 AM

## 2017-05-19 ENCOUNTER — Encounter (HOSPITAL_COMMUNITY): Payer: Self-pay | Admitting: Orthopedic Surgery

## 2017-05-19 DIAGNOSIS — N289 Disorder of kidney and ureter, unspecified: Secondary | ICD-10-CM

## 2017-05-19 LAB — CBC
HEMATOCRIT: 26.4 % — AB (ref 36.0–46.0)
HEMOGLOBIN: 8.7 g/dL — AB (ref 12.0–15.0)
MCH: 25.7 pg — ABNORMAL LOW (ref 26.0–34.0)
MCHC: 33 g/dL (ref 30.0–36.0)
MCV: 78.1 fL (ref 78.0–100.0)
Platelets: 282 10*3/uL (ref 150–400)
RBC: 3.38 MIL/uL — AB (ref 3.87–5.11)
RDW: 14.4 % (ref 11.5–15.5)
WBC: 9.3 10*3/uL (ref 4.0–10.5)

## 2017-05-19 LAB — BASIC METABOLIC PANEL
Anion gap: 10 (ref 5–15)
BUN: 52 mg/dL — ABNORMAL HIGH (ref 6–20)
CHLORIDE: 108 mmol/L (ref 101–111)
CO2: 21 mmol/L — ABNORMAL LOW (ref 22–32)
Calcium: 8.1 mg/dL — ABNORMAL LOW (ref 8.9–10.3)
Creatinine, Ser: 2.99 mg/dL — ABNORMAL HIGH (ref 0.44–1.00)
GFR calc non Af Amer: 15 mL/min — ABNORMAL LOW (ref >60)
GFR, EST AFRICAN AMERICAN: 18 mL/min — AB (ref >60)
Glucose, Bld: 108 mg/dL — ABNORMAL HIGH (ref 65–99)
POTASSIUM: 3.4 mmol/L — AB (ref 3.5–5.1)
SODIUM: 139 mmol/L (ref 135–145)

## 2017-05-19 LAB — GLUCOSE, CAPILLARY
GLUCOSE-CAPILLARY: 80 mg/dL (ref 65–99)
GLUCOSE-CAPILLARY: 123 mg/dL — AB (ref 65–99)
GLUCOSE-CAPILLARY: 284 mg/dL — AB (ref 65–99)
GLUCOSE-CAPILLARY: 94 mg/dL (ref 65–99)

## 2017-05-19 NOTE — Progress Notes (Addendum)
Subjective: 3 Days Post-Op Procedure(s) (LRB): TOTAL KNEE ARTHROPLASTY (Left) Patient reports pain as moderate.  Patient reports she is voiding okay. Was in and out cathed yesterday with 400 mL residual. Taking by mouth okay. Overall feeling better today.  Objective: Vital signs in last 24 hours: Temp:  [97.9 F (36.6 C)-98.2 F (36.8 C)] 97.9 F (36.6 C) (06/04 0618) Pulse Rate:  [69-80] 77 (06/04 0618) Resp:  [16-18] 18 (06/04 0618) BP: (135-164)/(62-89) 153/62 (06/04 0618) SpO2:  [95 %-100 %] 96 % (06/04 0618)  Intake/Output from previous day: 06/03 0701 - 06/04 0700 In: 1858.8 [P.O.:840; I.V.:1018.8] Out: 800 [Urine:800] Intake/Output this shift: No intake/output data recorded.   Recent Labs  05/17/17 0618 05/18/17 0553 05/19/17 0510  HGB 10.3* 9.8* 8.7*    Recent Labs  05/18/17 0553 05/19/17 0510  WBC 13.6* 9.3  RBC 3.82* 3.38*  HCT 29.6* 26.4*  PLT 309 282    Recent Labs  05/18/17 0553 05/19/17 0510  NA 135 139  K 3.4* 3.4*  CL 101 108  CO2 22 21*  BUN 52* 52*  CREATININE 3.29* 2.99*  GLUCOSE 129* 108*  CALCIUM 8.6* 8.1*   No results for input(s): LABPT, INR in the last 72 hours. Left knee exam: Neurovascular intact Sensation intact distally Intact pulses distally Dorsiflexion/Plantar flexion intact Incision: dressing C/D/I Compartment soft  Assessment/Plan: 3 Days Post-Op Procedure(s) (LRB): TOTAL KNEE ARTHROPLASTY (Left) Renal insufficiency, creatinine improved today with hydration and holding Lasix. Plan: Hold Lasix 20 mg today. Continue IV fluids until discharged hopefully later today. Aspirin 325 mg twice daily for DVT prophylaxis. Will order Zero knee to help her gain full extension Up with therapy Discharge to SNF Recheck BMET in 3 days. Follow-up with Dr. Luiz BlareGraves in 2 weeks.  Kytzia Gienger G 05/19/2017, 8:26 AM

## 2017-05-19 NOTE — Progress Notes (Signed)
Physical Therapy Treatment Patient Details Name: Sarah [name] MRN: [id number] DOB: 09/16/1951 Today's Date: 05/19/2017    History of Present Illness Pt is 66 y/o female s/p elective L TKA. PMH includes DM with renal complications, peripheral neuropathy, HTN, anxiety, neck surgery, ankle surgery and knee arthroscopy.     PT Comments    Pt performed LE HEP in supine and progressed gait during session.  On arrival patient lethargic with difficulty arousing patient.  As treatment progressed patient more alert.  Informed nursing of swollen top lip.  Vitals stable during nursing assessment ( see flow sheet).  PTA placed patient's L foot/ankle in bone foam and she was only able to tolerate x 10 min.  She was writhing in pain after 10 min of bone foam use.  PTA removed bone foam and applied ice for pain management.  Informed nursing of increased pain post session.      Follow Up Recommendations  DC plan and follow up therapy as arranged by surgeon;Supervision/Assistance - 24 hour     Equipment Recommendations  None recommended by PT    Recommendations for Other Services       Precautions / Restrictions Precautions Precautions: Knee Precaution Booklet Issued: Yes (comment) Precaution Comments: Reviewed supine ther ex with pt Restrictions Weight Bearing Restrictions: Yes LLE Weight Bearing: Weight bearing as tolerated    Mobility  Bed Mobility               General bed mobility comments: Pt in recliner on arrival.    Transfers Overall transfer level: Needs assistance Equipment used: Rolling walker (2 wheeled) Transfers: Sit to/from Stand Sit to Stand: Min guard         General transfer comment: Cues for sequencing and hand placement.  Cues to advance LLE forward to improve ease and decrease pain.    Ambulation/Gait Ambulation/Gait assistance: Min guard Ambulation Distance (Feet):  (15 ft + 5280ft ) Assistive device: Rolling walker (2 wheeled) Gait Pattern/deviations:  Antalgic;Decreased stride length;Step-to pattern;Trunk flexed Gait velocity: Decreased Gait velocity interpretation: Below normal speed for age/gender General Gait Details: verbal cues for posture and sequencing, reports arms feel weak during gait training.     Stairs            Wheelchair Mobility    Modified Rankin (Stroke Patients Only)       Balance Overall balance assessment: Needs assistance Sitting-balance support: No upper extremity supported;Feet supported Sitting balance-Leahy Scale: Good     Standing balance support: Bilateral upper extremity supported;During functional activity Standing balance-Leahy Scale: Poor Standing balance comment: Reliant on RW for stability.                             Cognition Arousal/Alertness: Lethargic;Suspect due to medications Behavior During [facility] tasks assessed/performed Overall Cognitive Status: Within Functional Limits for tasks assessed                                 General Comments: Initially groggy until performing PT then switched to writhing in pain post tx.        Exercises Total Joint Exercises Ankle Circles/Pumps: AROM;Both;10 reps Quad Sets: AROM;Left;10 reps Towel Squeeze: AROM;Both;10 reps;Supine Short Arc Quad: AROM;Left;Both;10 reps;Supine Heel Slides: AROM;Left;10 reps Hip ABduction/ADduction: AROM;Left;10 reps Straight Leg Raises: AAROM;Left;10 reps;Supine Goniometric ROM: 70 degrees flexion L knee.      General Comments  Pertinent Vitals/Pain Pain Assessment: 0-10 Pain Score: 10-Worst pain ever Pain Descriptors / Indicators: Crying;Burning;Aching;Grimacing;Guarding Pain Intervention(s): Monitored during session;Repositioned;Ice applied (after 10 min duration removed bone foam as she is unable to tolerate.  )    Home Living                      Prior Function            PT Goals (current goals can now be found in the care plan section)  Acute Rehab PT Goals Patient Stated Goal: To get the boam foam off because it hurts her leg.   Potential to Achieve Goals: Good Progress towards PT goals: Progressing toward goals    Frequency    7X/week      PT Plan Current plan remains appropriate    Co-evaluation              AM-PAC PT "6 Clicks" Daily Activity  Outcome Measure  Difficulty turning over in bed (including adjusting bedclothes, sheets and blankets)?: A Lot Difficulty moving from lying on back to sitting on the side of the bed? : A Lot Difficulty sitting down on and standing up from a chair with arms (e.g., wheelchair, bedside commode, etc,.)?: A Lot Help needed moving to and from a bed to chair (including a wheelchair)?: A Little Help needed walking in hospital room?: A Little Help needed climbing 3-5 steps with a railing? : A Little 6 Click Score: 15    End of Session Equipment Utilized During Treatment: Gait belt Activity Tolerance: Patient limited by pain Patient left: in chair;with call bell/phone within reach Nurse Communication: Mobility status PT Visit Diagnosis: Other abnormalities of gait and mobility (R26.89);Pain Pain - Right/Left: Left Pain - part of body: Knee     Time: 1010-1057 PT Time Calculation (min) (ACUTE ONLY): 47 min  Charges:  $Gait Training: 8-22 mins $Therapeutic Exercise: 8-22 mins $Therapeutic Activity: 8-22 mins                    G Codes:       Joycelyn Rua, PTA pager [phone]    Florestine Avers 05/19/2017, 11:03 AM

## 2017-05-19 NOTE — Discharge Summary (Addendum)
Patient ID: Sarah [name] MRN: [id number] DOB/AGE: September 09, 1951 66 y.o.  Admit date: 05/16/2017 Discharge date: 05/20/2017  Admission Diagnoses:  Principal Problem:   Primary osteoarthritis of left knee Active Problems:   Diabetes mellitus with renal complications [facility])   Renal insufficiency   Discharge Diagnoses:  Same  Past Medical History:  Diagnosis Date  . Allergy   . Anxiety    history   . Arthritis   . Cataract    history  . Chronic kidney disease   . Diabetes mellitus   . Gout   . Heart murmur   . Hyperlipidemia   . Hypertension   . Peripheral neuropathy   . PONV (postoperative nausea and vomiting)   . Stroke [facility])     Surgeries: Procedure(s):Left TOTAL KNEE ARTHROPLASTY on 05/16/2017   Discharged Condition: Improved  Hospital Course: Sarah [name] is an 66 y.o. female who was admitted 05/16/2017 for operative treatment ofPrimary osteoarthritis of left knee. Patient has severe unremitting pain that affects sleep, daily activities, and work/hobbies. After pre-op clearance the patient was taken to the operating room on 05/16/2017 and underwent  Procedure(s): Left TOTAL KNEE ARTHROPLASTY.    Patient was given perioperative antibiotics:  Anti-infectives    Start     Dose/Rate Route Frequency Ordered Stop   05/16/17 1300  clindamycin (CLEOCIN) IVPB 600 mg     600 mg 100 mL/hr over 30 Minutes Intravenous Every 6 hours 05/16/17 1146 05/16/17 1747   05/16/17 0700  clindamycin (CLEOCIN) IVPB 900 mg     900 mg 100 mL/hr over 30 Minutes Intravenous To [facility] 05/15/17 0925 05/16/17 0735       Patient was given sequential compression devices, early ambulation, and chemoprophylaxis to prevent DVT.The patient had an elevation in her creatinine up to 3.29 on the postop day #2. She was increase with her IV fluids and her Lasix was held and her creatinine dropped down to 2.99. She normally runs 2.74-2.44 previous to that. She was making urine at the time of her  discharge and voiding okay other than having to be in and out cathed 1. On the date of discharge the patient reported that she was voiding well. She was making slow progress with physical therapy. A 0 bone foam was ordered to help regain her extension.  Patient benefited maximally from hospital stay and there were no complications.    Recent vital signs:  Patient Vitals for the past 24 hrs:  BP Temp Temp src Pulse Resp SpO2  05/20/17 0533 (!) 161/72 98.7 F (37.1 C) Oral 70 - 100 %  05/19/17 2044 (!) 145/59 98.6 F (37 C) Oral 73 15 100 %  05/19/17 1502 (!) 142/83 97.6 F (36.4 C) Oral 75 16 98 %  05/19/17 1031 (!) 149/57 97.6 F (36.4 C) - - 16 97 %     Recent laboratory studies:   Recent Labs  05/18/17 0553 05/19/17 0510  WBC 13.6* 9.3  HGB 9.8* 8.7*  HCT 29.6* 26.4*  PLT 309 282  NA 135 139  K 3.4* 3.4*  CL 101 108  CO2 22 21*  BUN 52* 52*  CREATININE 3.29* 2.99*  GLUCOSE 129* 108*  CALCIUM 8.6* 8.1*     Discharge Medications:   Allergies as of 05/20/2017      Reactions   Metoprolol Swelling   SWELLING REACTION UNSPECIFIED    Cefaclor Hives   Tetanus Toxoid Adsorbed Swelling   SWELLING REACTION UNSPECIFIED       Medication List  STOP taking these medications   B-D SINGLE USE SWABS REGULAR Pads   cyclobenzaprine 5 MG tablet Commonly known as:  FLEXERIL   HYDROcodone-acetaminophen 10-325 MG tablet Commonly known as:  NORCO   lidocaine 5 % Commonly known as:  LIDODERM     TAKE these medications   ACCU-CHEK FASTCLIX LANCETS Misc USE TO CHECK BLOOD SUGAR THREE TIMES A DAY BEFORE MEALS AND AT BEDTIME   ACCU-CHEK NANO SMARTVIEW w/Device Kit USE AS DIRECTED   ACCU-CHEK SMARTVIEW test strip Generic drug:  glucose blood USE TO CHECK BLOOD SUGAR THREE TIMES A DAY BEFORE MEALS AND AT BEDTIME   allopurinol 300 MG tablet Commonly known as:  ZYLOPRIM Take 1 tablet (300 mg total) by mouth daily.   amLODipine 10 MG tablet Commonly known as:   NORVASC Take 1 tablet (10 mg total) by mouth daily.   aspirin EC 325 MG tablet Take 1 tablet (325 mg total) by mouth 2 (two) times daily after a meal. Take x 1 month post op to decrease risk of blood clots. What changed:  medication strength  how much to take  how to take this  when to take this  additional instructions   atorvastatin 80 MG tablet Commonly known as:  LIPITOR TAKE 1 TABLET (80 MG TOTAL) BY MOUTH DAILY.   COLCRYS 0.6 MG tablet Generic drug:  colchicine Take 1 tablet (0.6 mg total) by mouth daily. What changed:  when to take this  reasons to take this   furosemide 20 MG tablet Commonly known as:  LASIX Take 1 tablet (20 mg total) by mouth daily.   insulin aspart 100 UNIT/ML FlexPen Commonly known as:  NOVOLOG FLEXPEN 20 units prior to each meal   insulin glargine 100 UNIT/ML injection Commonly known as:  LANTUS 30 units at bedtime What changed:  how much to take  how to take this  when to take this  additional instructions   insulin lispro 100 UNIT/ML KiwkPen Commonly known as:  HUMALOG KWIKPEN INJECT 15 UNITS (0.15 ML 3 TIMES A DAY   Insulin Pen Needle 30G X 8 MM Misc Commonly known as:  NOVOFINE Inject 10 each into the skin as needed.   KLOR-CON/EF 25 MEQ disintegrating tablet Generic drug:  potassium bicarbonate Take 25 mEq by mouth daily. In 6 oz of water   LORazepam 0.5 MG tablet Commonly known as:  ATIVAN Take 1 tablet (0.5 mg total) by mouth 2 (two) times daily as needed for anxiety.   oxyCODONE-acetaminophen 5-325 MG tablet Commonly known as:  PERCOCET/ROXICET Take 1-2 tablets by mouth every 6 (six) hours as needed for severe pain.   potassium chloride SA 20 MEQ tablet Commonly known as:  K-DUR,KLOR-CON Take 1 tablet (20 mEq total) by mouth daily.   tiZANidine 2 MG tablet Commonly known as:  ZANAFLEX Take 1 tablet (2 mg total) by mouth every 8 (eight) hours as needed for muscle spasms.       Diagnostic Studies: Dg  Chest 2 View  Result Date: 05/09/2017 CLINICAL DATA:  Total knee replacement.  Preop chest x-ray . EXAM: CHEST  2 VIEW COMPARISON:  12/08/2014 . FINDINGS: Mediastinum hilar structures are normal. Lungs are clear. Heart size stable. No pulmonary venous congestion. No pleural effusion or pneumothorax . Cervical spine fusion . Old right clavicular fracture. IMPRESSION: Low lung volumes with mild basilar atelectasis. Electronically Signed   By: Marcello Moores  Register   On: 05/09/2017 14:15    Disposition: 01-Home or Self Care  Discharge Instructions  CPM    Complete by:  As directed    Continuous passive motion machine (CPM):      Use the CPM from 0 to 60 for 8 hours per day.      You may increase by 5-10 per day.  You may break it up into 2 or 3 sessions per day.      Use CPM for 1-2 weeks or until you are told to stop.   Call MD / Call 911    Complete by:  As directed    If you experience chest pain or shortness of breath, CALL 911 and be transported to the hospital emergency room.  If you develope a fever above 101 F, pus (white drainage) or increased drainage or redness at the wound, or calf pain, call your surgeon's office.   Diet Carb Modified    Complete by:  As directed    Do not put a pillow under the knee. Place it under the heel.    Complete by:  As directed    Increase activity slowly as tolerated    Complete by:  As directed    Weight bearing as tolerated    Complete by:  As directed    Laterality:  left   Extremity:  Lower       Contact information for follow-up providers    Dorna Leitz, MD. Schedule an appointment as soon as possible for a visit in 2 weeks.   Specialty:  Orthopedic Surgery Contact information: [place] [address] [phone]            Contact information for after-discharge care    Destination    [facility] .   Specialty:  [place] information: [place]  [place] [phone]                   Signed: Erlene Senters 05/20/2017, 9:54 AM  Patient ID: LOXLEY CIBRIAN MRN: [id number] DOB/AGE: 10-May-1951 66 y.o.  Admit date: 05/16/2017 Discharge date: 05/20/2017  Admission Diagnoses:  Principal Problem:   Primary osteoarthritis of left knee Active Problems:   Diabetes mellitus with renal complications [facility])   Renal insufficiency   Discharge Diagnoses:  Same  Past Medical History:  Diagnosis Date  . Allergy   . Anxiety    history   . Arthritis   . Cataract    history  . Chronic kidney disease   . Diabetes mellitus   . Gout   . Heart murmur   . Hyperlipidemia   . Hypertension   . Peripheral neuropathy   . PONV (postoperative nausea and vomiting)   . Stroke [facility])     Surgeries: Procedure(s): TOTAL KNEE ARTHROPLASTY on 05/16/2017   Consultants:   Discharged Condition: Improved  Hospital Course: Sarah [name] is an 66 y.o. female who was admitted 05/16/2017 for operative treatment ofPrimary osteoarthritis of left knee. Patient has severe unremitting pain that affects sleep, daily activities, and work/hobbies. After pre-op clearance the patient was taken to the operating room on 05/16/2017 and underwent  Procedure(s): TOTAL KNEE ARTHROPLASTY.    Patient was given perioperative antibiotics:  Anti-infectives    Start     Dose/Rate Route Frequency Ordered Stop   05/16/17 1300  clindamycin (CLEOCIN) IVPB 600 mg     600 mg 100 mL/hr over 30 Minutes Intravenous Every 6 hours 05/16/17 1146 05/16/17 1747   05/16/17 0700  clindamycin (CLEOCIN) IVPB 900 mg  900 mg 100 mL/hr over 30 Minutes Intravenous To [facility] 05/15/17 0925 05/16/17 0735       Patient was given sequential compression devices, early ambulation, and chemoprophylaxis to prevent DVT.  Patient benefited maximally from hospital stay and there were no complications.    Recent vital signs:  Patient Vitals for the past 24 hrs:  BP Temp Temp src  Pulse Resp SpO2  05/20/17 0533 (!) 161/72 98.7 F (37.1 C) Oral 70 - 100 %  05/19/17 2044 (!) 145/59 98.6 F (37 C) Oral 73 15 100 %  05/19/17 1502 (!) 142/83 97.6 F (36.4 C) Oral 75 16 98 %  05/19/17 1031 (!) 149/57 97.6 F (36.4 C) - - 16 97 %     Recent laboratory studies:   Recent Labs  05/18/17 0553 05/19/17 0510  WBC 13.6* 9.3  HGB 9.8* 8.7*  HCT 29.6* 26.4*  PLT 309 282  NA 135 139  K 3.4* 3.4*  CL 101 108  CO2 22 21*  BUN 52* 52*  CREATININE 3.29* 2.99*  GLUCOSE 129* 108*  CALCIUM 8.6* 8.1*     Discharge Medications:   Allergies as of 05/20/2017      Reactions   Metoprolol Swelling   SWELLING REACTION UNSPECIFIED    Cefaclor Hives   Tetanus Toxoid Adsorbed Swelling   SWELLING REACTION UNSPECIFIED       Medication List    STOP taking these medications   B-D SINGLE USE SWABS REGULAR Pads   cyclobenzaprine 5 MG tablet Commonly known as:  FLEXERIL   HYDROcodone-acetaminophen 10-325 MG tablet Commonly known as:  NORCO   lidocaine 5 % Commonly known as:  LIDODERM     TAKE these medications   ACCU-CHEK FASTCLIX LANCETS Misc USE TO CHECK BLOOD SUGAR THREE TIMES A DAY BEFORE MEALS AND AT BEDTIME   ACCU-CHEK NANO SMARTVIEW w/Device Kit USE AS DIRECTED   ACCU-CHEK SMARTVIEW test strip Generic drug:  glucose blood USE TO CHECK BLOOD SUGAR THREE TIMES A DAY BEFORE MEALS AND AT BEDTIME   allopurinol 300 MG tablet Commonly known as:  ZYLOPRIM Take 1 tablet (300 mg total) by mouth daily.   amLODipine 10 MG tablet Commonly known as:  NORVASC Take 1 tablet (10 mg total) by mouth daily.   aspirin EC 325 MG tablet Take 1 tablet (325 mg total) by mouth 2 (two) times daily after a meal. Take x 1 month post op to decrease risk of blood clots. What changed:  medication strength  how much to take  how to take this  when to take this  additional instructions   atorvastatin 80 MG tablet Commonly known as:  LIPITOR TAKE 1 TABLET (80 MG TOTAL) BY  MOUTH DAILY.   COLCRYS 0.6 MG tablet Generic drug:  colchicine Take 1 tablet (0.6 mg total) by mouth daily. What changed:  when to take this  reasons to take this   furosemide 20 MG tablet Commonly known as:  LASIX Take 1 tablet (20 mg total) by mouth daily.   insulin aspart 100 UNIT/ML FlexPen Commonly known as:  NOVOLOG FLEXPEN 20 units prior to each meal   insulin glargine 100 UNIT/ML injection Commonly known as:  LANTUS 30 units at bedtime What changed:  how much to take  how to take this  when to take this  additional instructions   insulin lispro 100 UNIT/ML KiwkPen Commonly known as:  HUMALOG KWIKPEN INJECT 15 UNITS (0.15 ML 3 TIMES A DAY   Insulin Pen Needle  30G X 8 MM Misc Commonly known as:  NOVOFINE Inject 10 each into the skin as needed.   KLOR-CON/EF 25 MEQ disintegrating tablet Generic drug:  potassium bicarbonate Take 25 mEq by mouth daily. In 6 oz of water   LORazepam 0.5 MG tablet Commonly known as:  ATIVAN Take 1 tablet (0.5 mg total) by mouth 2 (two) times daily as needed for anxiety.   oxyCODONE-acetaminophen 5-325 MG tablet Commonly known as:  PERCOCET/ROXICET Take 1-2 tablets by mouth every 6 (six) hours as needed for severe pain.   potassium chloride SA 20 MEQ tablet Commonly known as:  K-DUR,KLOR-CON Take 1 tablet (20 mEq total) by mouth daily.   tiZANidine 2 MG tablet Commonly known as:  ZANAFLEX Take 1 tablet (2 mg total) by mouth every 8 (eight) hours as needed for muscle spasms.       Diagnostic Studies: Dg Chest 2 View  Result Date: 05/09/2017 CLINICAL DATA:  Total knee replacement.  Preop chest x-ray . EXAM: CHEST  2 VIEW COMPARISON:  12/08/2014 . FINDINGS: Mediastinum hilar structures are normal. Lungs are clear. Heart size stable. No pulmonary venous congestion. No pleural effusion or pneumothorax . Cervical spine fusion . Old right clavicular fracture. IMPRESSION: Low lung volumes with mild basilar atelectasis.  Electronically Signed   By: Marcello Moores  Register   On: 05/09/2017 14:15    Disposition: 01-Home or Self Care  Discharge Instructions    CPM    Complete by:  As directed    Continuous passive motion machine (CPM):      Use the CPM from 0 to 60 for 8 hours per day.      You may increase by 5-10 per day.  You may break it up into 2 or 3 sessions per day.      Use CPM for 1-2 weeks or until you are told to stop.   Call MD / Call 911    Complete by:  As directed    If you experience chest pain or shortness of breath, CALL 911 and be transported to the hospital emergency room.  If you develope a fever above 101 F, pus (white drainage) or increased drainage or redness at the wound, or calf pain, call your surgeon's office.   Diet Carb Modified    Complete by:  As directed    Do not put a pillow under the knee. Place it under the heel.    Complete by:  As directed    Increase activity slowly as tolerated    Complete by:  As directed    Weight bearing as tolerated    Complete by:  As directed    Laterality:  left   Extremity:  Lower    The patient will need a BMET in 3 days to check her creatinine. Encourage fluid intake.   Contact information for follow-up providers    Dorna Leitz, MD. Schedule an appointment as soon as possible for a visit in 2 weeks.   Specialty:  Orthopedic Surgery Contact information: [place] [address] [phone]            Contact information for after-discharge care    Destination    [facility] .   Specialty:  [place] information: [place] [phone]                   Signed: Erlene Senters 05/20/2017, 9:54 AM

## 2017-05-19 NOTE — Care Management Important Message (Signed)
Important Message  Patient Details  Name: Sarah [name] MRN: [id number] Date of Birth: 05/24/1951   Medicare Important Message Given:  Yes    Elliot CousinShavis, Latish Toutant Ellen, RN 05/19/2017, 11:30 AM

## 2017-05-19 NOTE — Care Management Note (Signed)
Case Management Note  Patient Details  Name: Starling MannsMickey L Stines MRN: [id number] Date of Birth: 10/10/1951  Subjective/Objective:    S/p Left  TKA                Action/Plan: Discharge Planning: Chart reviewed. Scheduled dc to SNF rehab. CSW following for SNF placement.   PCP Gordy SaversKWIATKOWSKI, PETER F MD  Expected Discharge Date:  05/19/17               Expected Discharge Plan:  Skilled Nursing Facility  In-House Referral:  Clinical Social Work  Discharge planning Services  CM Consult  Post Acute Care Choice:  NA Choice offered to:  NA  DME Arranged:  N/A DME Agency:  NA  HH Arranged:  NA HH Agency:  NA  Status of Service:  Completed, signed off  If discussed at Long Length of Stay Meetings, dates discussed:    Additional Comments:  Elliot CousinShavis, Devinne Epstein Ellen, RN 05/19/2017, 11:31 AM

## 2017-05-19 NOTE — Progress Notes (Signed)
Orthopedic Tech Progress Note Patient Details:  Sarah [name] 01/29/1951 [id number]  Ortho Devices Ortho Device/Splint Location: on cpm at 1900 Ortho Device/Splint Interventions: Application   Jennye MoccasinHughes, Valerie Fredin Craig 05/19/2017, 7:01 PM

## 2017-05-19 NOTE — Op Note (Signed)
NAME:  Sarah [name], Sarah [name]                     ACCOUNT NO.:  MEDICAL RECORD NO.:  [id number]  LOCATION:                                 FACILITY:  PHYSICIAN:  Harvie JuniorJohn L. Deina Lipsey, M.D.        DATE OF BIRTH:  DATE OF PROCEDURE:  05/16/2017 DATE OF DISCHARGE:                              OPERATIVE REPORT   PREOPERATIVE DIAGNOSES: 1. 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 2. Significantly stiff knee preoperatively.  POSTOPERATIVE DIAGNOSES: 1. 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 2. Significantly stiff knee preoperatively.  PROCEDURE:  Left total knee replacement with Attune system size 3 femur, size 4 tibia, 7 mm bridging bearing, and a 35 mm all polyethylene patella.  SURGEON:  Harvie JuniorJohn L. Shajuana Mclucas, M.D.  ASSISTANT:  Marshia LyJames Bethune, P.A.  ANESTHESIA:  General.  BRIEF HISTORY:  Sarah [name] is a 66 year old female with a long history of significant complaints of left knee pain.  She had been treated conservatively for prolonged period of time.  She has failed conservative care including activity modification, injection therapy, and physical therapy.  After failure of all conservative care, she was taken to the operating room for left total knee replacement.  X-ray showed bone-on-bone change, and she was having night pain and light activity pain preoperatively.  DESCRIPTION OF PROCEDURE:  The patient was taken to the operating room. After adequate anesthesia was obtained with general anesthetic, the patient was placed supine on the operating room table.  She had the left knee prepped and draped in usual sterile fashion.  Following this, attention was turned to the left knee where after routine prep and drape, an incision was made for exposure of the knee subcutaneous tissue down to the level of extensor mechanism and a medial parapatellar arthrotomy was undertaken.  Once this was completed, attention was turned  towards the knee where medial and lateral meniscus were removed, retropatellar fat pad was removed, synovium anterior aspect of the femur, and anterior and posterior cruciate.  Once this was done, retractors were put in place and the intramedullary hole was drilled in the femur.  A distal alignment of 3 degrees was undertaken and the distal cut of 9 mm were taken on the femur.  Once this was done, anterior and posterior cuts were made, chamfers and box of sized to a 3. At this time, attention was turned towards the tibia where the patient was cut perpendicular to the long axis with 3 degrees of posterior slope and the tibia was then sized to a 4.  It was drilled and keeled.  Once this was done, attention was turned towards the patella and was cut down to a level of 13 mm and 35 poly was chosen and put through a range of motion.  At this point, we went back up to the posterior capsular releasing and removal of posterior osteophytes.  After removal of all trial components, the knee was then copiously and thoroughly lavaged, pulsatile lavage, irrigation and suctioned dry.  Attention was then turned towards the implantation of the final components.  The final  components were cemented into place, size 4 tibia, size 3 femur, a 7 mm bridging bearing trial was placed and a 35 mm all-polyethylene patella was placed and held with a clamp.  All excess bone cement was removed. Once the cement was completely hardened, the attention was turned towards the removal of the trial component.  Tourniquet was let down. 20 mL of Exparel and 30 mL of Marcaine were mixed and instilled throughout the synovial reflection for postoperative pain control.  At this point, the final poly was placed and the medial parapatellar arthrotomy was closed with 1 Vicryl running, skin was closed with 0 and 2-0 Vicryl, and 3-0 Monocryl subcuticular.  Benzoin and Steri-Strips were applied.  Sterile compressive dressing was applied.   The patient was taken to the recovery room, and she was noted to be in satisfactory condition.  Estimated blood loss for the procedure is minimal.     Harvie Junior, M.D.     Ranae Plumber  D:  05/18/2017  T:  05/18/2017  Job:  [id number]

## 2017-05-19 NOTE — NC FL2 (Signed)
[place] MEDICAID FL2 LEVEL OF CARE SCREENING TOOL     IDENTIFICATION  Patient Name: Sarah MannsMickey L Mcavoy Birthdate: 01/03/1951 Sex: female Admission Date (Current Location): 05/16/2017  [facility] [place]   Facility and Address:  [facility], [address], [place], [place] [address]      Provider Number: [id number]  Attending Physician Name and Address:  Jodi GeraldsGraves, John, MD  Relative Name and Phone Number:       Current Level of Care: Hospital Recommended Level of Care: Skilled Nursing Facility Prior Approval Number:    Date Approved/Denied:   PASRR Number: [id number]  Discharge Plan: SNF    Current Diagnoses: Patient Active Problem List   Diagnosis Date Noted  . Renal insufficiency 05/19/2017  . Primary osteoarthritis of left knee 05/16/2017  . Disorder of kidney and ureter 06/29/2009  . ANXIETY 07/01/2008  . Diabetes mellitus with renal complications (HCC) 05/22/2007  . Pure hypercholesterolemia 05/22/2007  . Essential hypertension 05/22/2007  . History of cardiovascular disorder 05/22/2007  . ARTHROSCOPY, KNEE, HX OF 05/22/2007    Orientation RESPIRATION BLADDER Height & Weight     Self, Time, Situation, Place  Normal Continent Weight: 179 lb 0.2 oz (81.2 kg) Height:  5\' 7"  (170.2 cm)  BEHAVIORAL SYMPTOMS/MOOD NEUROLOGICAL BOWEL NUTRITION STATUS      Continent Diet (carb modified)  AMBULATORY STATUS COMMUNICATION OF NEEDS Skin   Limited Assist Verbally Surgical wounds                       Personal Care Assistance Level of Assistance  Bathing, Feeding, Dressing Bathing Assistance: Limited assistance Feeding assistance: Independent Dressing Assistance: Limited assistance     Functional Limitations Info  Sight, Hearing, Speech Sight Info: Adequate Hearing Info: Adequate Speech Info: Adequate    SPECIAL CARE FACTORS FREQUENCY  PT (By licensed PT), OT (By licensed OT)     PT Frequency:  (5x/week) OT  Frequency:  (5x/week)            Contractures Contractures Info: Not present    Additional Factors Info  Code Status, Allergies, Insulin Sliding Scale (full) Code Status Info:  (full) Allergies Info:  (metoprolol, cefaclor, tetanus toxoid absorbed)   Insulin Sliding Scale Info:  (3x daily with meals)       Current Medications (05/19/2017):  This is the current hospital active medication list Current Facility-Administered Medications  Medication Dose Route Frequency Provider Last Rate Last Dose  . 0.9 %  sodium chloride infusion   Intravenous Continuous Marshia LyBethune, James, PA-C 100 mL/hr at 05/16/17 1147    . 0.9 %  sodium chloride infusion   Intravenous Continuous Marshia LyBethune, James, PA-C 125 mL/hr at 05/19/17 0035    . acetaminophen (TYLENOL) tablet 650 mg  650 mg Oral Q6H PRN Marshia LyBethune, James, PA-C   650 mg at 05/18/17 2131   Or  . acetaminophen (TYLENOL) suppository 650 mg  650 mg Rectal Q6H PRN Marshia LyBethune, James, PA-C      . allopurinol (ZYLOPRIM) tablet 300 mg  300 mg Oral Daily Marshia LyBethune, James, PA-C   300 mg at 05/19/17 0841  . alum & mag hydroxide-simeth (MAALOX/MYLANTA) 200-200-20 MG/5ML suspension 30 mL  30 mL Oral Q4H PRN Marshia LyBethune, James, PA-C      . amLODipine (NORVASC) tablet 10 mg  10 mg Oral Daily Marshia LyBethune, James, PA-C   10 mg at 05/19/17 0841  . aspirin EC tablet 325 mg  325 mg Oral BID PC Marshia LyBethune, James,  PA-C   325 mg at 05/19/17 0838  . atorvastatin (LIPITOR) tablet 80 mg  80 mg Oral q1800 Marshia Ly, PA-C   80 mg at 05/18/17 1851  . bisacodyl (DULCOLAX) EC tablet 5 mg  5 mg Oral Daily PRN Marshia Ly, PA-C   5 mg at 05/19/17 8119  . diphenhydrAMINE (BENADRYL) 12.5 MG/5ML elixir 12.5-25 mg  12.5-25 mg Oral Q4H PRN Marshia Ly, PA-C      . docusate sodium (COLACE) capsule 100 mg  100 mg Oral BID Marshia Ly, PA-C   100 mg at 05/19/17 1478  . gabapentin (NEURONTIN) capsule 300 mg  300 mg Oral BID Marshia Ly, PA-C   300 mg at 05/19/17 2956  . HYDROmorphone (DILAUDID)  injection 0.5-1 mg  0.5-1 mg Intravenous Q3H PRN Marshia Ly, PA-C   1 mg at 05/17/17 1425  . insulin aspart (novoLOG) injection 0-15 Units  0-15 Units Subcutaneous TID WC Marshia Ly, PA-C   2 Units at 05/19/17 1212  . insulin glargine (LANTUS) injection 30 Units  30 Units Subcutaneous QHS Marshia Ly, PA-C   30 Units at 05/18/17 2129  . LORazepam (ATIVAN) tablet 0.5 mg  0.5 mg Oral BID PRN Marshia Ly, PA-C   0.5 mg at 05/17/17 1425  . magnesium citrate solution 1 Bottle  1 Bottle Oral Once PRN Marshia Ly, PA-C      . methocarbamol (ROBAXIN) tablet 500 mg  500 mg Oral Q6H PRN Marshia Ly, PA-C   500 mg at 05/19/17 1324   Or  . methocarbamol (ROBAXIN) 500 mg in dextrose 5 % 50 mL IVPB  500 mg Intravenous Q6H PRN Marshia Ly, PA-C      . ondansetron [facility]) tablet 4 mg  4 mg Oral Q6H PRN Marshia Ly, PA-C       Or  . ondansetron [facility]) injection 4 mg  4 mg Intravenous Q6H PRN Marshia Ly, PA-C   4 mg at 05/19/17 0849  . oxyCODONE (Oxy IR/ROXICODONE) immediate release tablet 5-10 mg  5-10 mg Oral Q3H PRN Marshia Ly, PA-C   10 mg at 05/18/17 1229  . polyethylene glycol (MIRALAX / GLYCOLAX) packet 17 g  17 g Oral Daily PRN Marshia Ly, PA-C   17 g at 05/18/17 1851  . potassium chloride SA (K-DUR,KLOR-CON) CR tablet 20 mEq  20 mEq Oral Daily Marshia Ly, PA-C   20 mEq at 05/19/17 2130  . promethazine (PHENERGAN) injection 6.25 mg  6.25 mg Intravenous Q6H PRN Marshia Ly, PA-C   6.25 mg at 05/17/17 8657     Discharge Medications: Please see discharge summary for a list of discharge medications.  Relevant Imaging Results:  Relevant Lab Results:   Additional Information    Tresa Moore, LCSW

## 2017-05-20 DIAGNOSIS — Z471 Aftercare following joint replacement surgery: Secondary | ICD-10-CM | POA: Diagnosis not present

## 2017-05-20 DIAGNOSIS — M1990 Unspecified osteoarthritis, unspecified site: Secondary | ICD-10-CM | POA: Diagnosis not present

## 2017-05-20 DIAGNOSIS — R278 Other lack of coordination: Secondary | ICD-10-CM | POA: Diagnosis not present

## 2017-05-20 DIAGNOSIS — R2681 Unsteadiness on feet: Secondary | ICD-10-CM | POA: Diagnosis not present

## 2017-05-20 DIAGNOSIS — M6281 Muscle weakness (generalized): Secondary | ICD-10-CM | POA: Diagnosis not present

## 2017-05-20 DIAGNOSIS — Z96652 Presence of left artificial knee joint: Secondary | ICD-10-CM | POA: Diagnosis not present

## 2017-05-20 DIAGNOSIS — S86191A Other injury of other muscle(s) and tendon(s) of posterior muscle group at lower leg level, right leg, initial encounter: Secondary | ICD-10-CM | POA: Diagnosis not present

## 2017-05-20 LAB — BASIC METABOLIC PANEL
ANION GAP: 9 (ref 5–15)
BUN: 38 mg/dL — ABNORMAL HIGH (ref 6–20)
CALCIUM: 8.5 mg/dL — AB (ref 8.9–10.3)
CO2: 22 mmol/L (ref 22–32)
Chloride: 109 mmol/L (ref 101–111)
Creatinine, Ser: 2.33 mg/dL — ABNORMAL HIGH (ref 0.44–1.00)
GFR, EST AFRICAN AMERICAN: 24 mL/min — AB (ref >60)
GFR, EST NON AFRICAN AMERICAN: 21 mL/min — AB (ref >60)
Glucose, Bld: 191 mg/dL — ABNORMAL HIGH (ref 65–99)
Potassium: 3.7 mmol/L (ref 3.5–5.1)
SODIUM: 140 mmol/L (ref 135–145)

## 2017-05-20 LAB — GLUCOSE, CAPILLARY
GLUCOSE-CAPILLARY: 91 mg/dL (ref 65–99)
GLUCOSE-CAPILLARY: 138 mg/dL — AB (ref 65–99)
GLUCOSE-CAPILLARY: 149 mg/dL — AB (ref 65–99)
Glucose-Capillary: 52 mg/dL — ABNORMAL LOW (ref 65–99)
Glucose-Capillary: 62 mg/dL — ABNORMAL LOW (ref 65–99)
Glucose-Capillary: 174 mg/dL — ABNORMAL HIGH (ref 65–99)

## 2017-05-20 NOTE — Progress Notes (Signed)
RN informed Sarah LyJames Bethune, PA, of patient's creatinine results, as requested by PA. PA stated OK for patient to be discharged to SNF and gave RN verbal order for discharge order to SNF to be placed.

## 2017-05-20 NOTE — Progress Notes (Signed)
PT Cancellation Note  Patient Details Name: Sarah [name] MRN: [id number] DOB: 08/22/1951   Cancelled Treatment:    Reason Eval/Treat Not Completed: (P) Other (comment) (Pt awaiting transfer to SNF, will defer PT needs to SNF at this time.)   Cha Gomillion J Aundria RudRogers 05/20/2017, 12:39 PM  Joycelyn RuaAimee Lemon Whitacre, PTA pager [phone]

## 2017-05-20 NOTE — Progress Notes (Signed)
RN notified Marshia LyJames Bethune, PA, of patient's BP of 170/80 on discharge VS before planned transfer to SNF. Patient does complain of pain, patient has been medicated with pain medication recently for pain. Marshia LyJames Bethune, [place], OK for patient to still be discharge to SNF by [facility] once they arrive.

## 2017-05-20 NOTE — Progress Notes (Signed)
Hypoglycemic Event  CBG: 52  Treatment: 15 GM carbohydrate snack  Symptoms: None  Follow-up CBG: Time:0408 CBG Result:91  Possible Reasons for Event: Inadequate meal intake  Comments/MD notified    Elly ModenaYim, Tiane Szydlowski

## 2017-05-20 NOTE — Progress Notes (Signed)
RN notified by SW that [facility] and that [facility] has been notified of patient's discharge. RN printed updated AVS with next dose of medications for SNF and printed a copy off for SNF. Patient notified of impending discharge to SNF. Rn asked patient if she would like RN to update her family and she said "No, I'll call them." Patient's IV removed. Nurse tech already assisted patient in getting dressed and bagged up patient's belongings in a personal belongings bag to go to SNF with patient. Report called to [place], Charity fundraiserN, at [facility]. Dianne, RN, verbalized understanding or RP transfer report.

## 2017-05-20 NOTE — Progress Notes (Signed)
Subjective: 4 Days Post-Op Procedure(s) (LRB): TOTAL KNEE ARTHROPLASTY (Left) Patient reports pain as mild. Taking by mouth and voiding okay. Ready to go to skilled nursing facility.   Objective: Vital signs in last 24 hours: Temp:  [97.6 F (36.4 C)-98.7 F (37.1 C)] 98.7 F (37.1 C) (06/05 0533) Pulse Rate:  [70-75] 70 (06/05 0533) Resp:  [15-16] 15 (06/04 2044) BP: (142-161)/(57-83) 161/72 (06/05 0533) SpO2:  [97 %-100 %] 100 % (06/05 0533)  Intake/Output from previous day: 06/04 0701 - 06/05 0700 In: 4211.8 [P.O.:716; I.V.:3495.8] Out: -  Intake/Output this shift: No intake/output data recorded.   Recent Labs  05/18/17 0553 05/19/17 0510  HGB 9.8* 8.7*    Recent Labs  05/18/17 0553 05/19/17 0510  WBC 13.6* 9.3  RBC 3.82* 3.38*  HCT 29.6* 26.4*  PLT 309 282    Recent Labs  05/18/17 0553 05/19/17 0510  NA 135 139  K 3.4* 3.4*  CL 101 108  CO2 22 21*  BUN 52* 52*  CREATININE 3.29* 2.99*  GLUCOSE 129* 108*  CALCIUM 8.6* 8.1*   No results for input(s): LABPT, INR in the last 72 hours. Left knee exam: Neurovascular intact Sensation intact distally Intact pulses distally Dorsiflexion/Plantar flexion intact Incision: dressing C/D/I Compartment soft  Assessment/Plan: 4 Days Post-Op Procedure(s) (LRB): TOTAL KNEE ARTHROPLASTY (Left) Plan: We'll check BMET prior to discharge. Weight-bear as tolerated on left. Aspirin 325 mg twice daily 1 month postop for DVT prophylaxis Follow-up with Dr. Luiz BlareGraves in 2 weeks. Discharge to SNF  Sarah [name] G 05/20/2017, 9:52 AM

## 2017-05-20 NOTE — Progress Notes (Signed)
Inpatient Diabetes Program Recommendations  AACE/ADA: New Consensus Statement on Inpatient Glycemic Control (2015)  Target Ranges:  Prepandial:   less than 140 mg/dL      Peak postprandial:   less than 180 mg/dL (1-2 hours)      Critically ill patients:  140 - 180 mg/dL   Results for Sarah [name], [name] (MRN [id number]) as of 05/20/2017 10:45  Ref. Range 05/19/2017 06:13 05/19/2017 11:34 05/19/2017 16:41 05/19/2017 21:39 05/20/2017 02:35 05/20/2017 03:16 05/20/2017 04:08 05/20/2017 06:59  Glucose-Capillary Latest Ref Range: 65 - 99 mg/dL 80 409123 (H) 811284 (H) 94 52 (L) 62 (L) 91 138 (H)  Results for Sarah [name], [name] (MRN [id number]) as of 05/20/2017 10:45  Ref. Range 05/18/2017 06:22 05/18/2017 11:53 05/18/2017 17:07 05/18/2017 21:11  Glucose-Capillary Latest Ref Range: 65 - 99 mg/dL 213101 (H) 086161 (H) 578208 (H) 223 (H)   Review of Glycemic Control  Diabetes history: DM Outpatient Diabetes medications: Lantus 30 units QHS, Novolog 20 units with meals Current orders for Inpatient glycemic control: Lantus 30 units QHS, Novolog 0-15 units TID with meals  Inpatient Diabetes Program Recommendations: Insulin - Basal: If patient is not discharged today, please consider decreasing Lantus to 25 units QHS. Correction (SSI): If patient is not discharged today, please consider decreasing Novolog correction to sensitive scale (0-9 units) TID. Insulin - Meal Coverage: If patient is not discharged today and is eating at least 50% of meals, please consider ordering Novolog 3 units TID with meals for meal coverage.  Thanks, Orlando PennerMarie Clevester Helzer, RN, MSN, CDE Diabetes Coordinator Inpatient Diabetes Program [phone] (Team Pager from 8am to 5pm)

## 2017-05-20 NOTE — Social Work (Signed)
[facility], insurance auth received.  Clinical Social Worker facilitated patient discharge including contacting patient family and facility to confirm patient discharge plans.  Clinical information faxed to facility and family agreeable with plan.  CSW arranged [facility].  RN to call [phone], [facility] 508 report prior to discharge.  Clinical Social Worker will sign off for now as social work intervention is no longer needed. Please consult us again if new need arises.  Keene BreathPatricia Curlie Sittner, LCSW Clinical Social Worker [phone]

## 2017-05-30 DIAGNOSIS — Z96652 Presence of left artificial knee joint: Secondary | ICD-10-CM | POA: Diagnosis not present

## 2017-05-31 DIAGNOSIS — R269 Unspecified abnormalities of gait and mobility: Secondary | ICD-10-CM | POA: Diagnosis not present

## 2017-05-31 DIAGNOSIS — I10 Essential (primary) hypertension: Secondary | ICD-10-CM | POA: Diagnosis not present

## 2017-05-31 DIAGNOSIS — M1990 Unspecified osteoarthritis, unspecified site: Secondary | ICD-10-CM | POA: Diagnosis not present

## 2017-05-31 DIAGNOSIS — Z471 Aftercare following joint replacement surgery: Secondary | ICD-10-CM | POA: Diagnosis not present

## 2017-06-02 DIAGNOSIS — M1712 Unilateral primary osteoarthritis, left knee: Secondary | ICD-10-CM | POA: Diagnosis not present

## 2017-06-03 ENCOUNTER — Telehealth: Payer: Self-pay | Admitting: Internal Medicine

## 2017-06-03 DIAGNOSIS — M1990 Unspecified osteoarthritis, unspecified site: Secondary | ICD-10-CM | POA: Diagnosis not present

## 2017-06-03 DIAGNOSIS — I10 Essential (primary) hypertension: Secondary | ICD-10-CM | POA: Diagnosis not present

## 2017-06-03 DIAGNOSIS — R269 Unspecified abnormalities of gait and mobility: Secondary | ICD-10-CM | POA: Diagnosis not present

## 2017-06-03 DIAGNOSIS — Z471 Aftercare following joint replacement surgery: Secondary | ICD-10-CM | POA: Diagnosis not present

## 2017-06-03 NOTE — Telephone Encounter (Signed)
Sarah [name] with [facility] completed pt's home health OT assessment today and states pt is modified independent and  No more OT is needed at this time.

## 2017-06-04 DIAGNOSIS — R269 Unspecified abnormalities of gait and mobility: Secondary | ICD-10-CM | POA: Diagnosis not present

## 2017-06-04 DIAGNOSIS — Z471 Aftercare following joint replacement surgery: Secondary | ICD-10-CM | POA: Diagnosis not present

## 2017-06-04 DIAGNOSIS — M1990 Unspecified osteoarthritis, unspecified site: Secondary | ICD-10-CM | POA: Diagnosis not present

## 2017-06-04 DIAGNOSIS — I10 Essential (primary) hypertension: Secondary | ICD-10-CM | POA: Diagnosis not present

## 2017-06-06 DIAGNOSIS — I10 Essential (primary) hypertension: Secondary | ICD-10-CM | POA: Diagnosis not present

## 2017-06-06 DIAGNOSIS — R269 Unspecified abnormalities of gait and mobility: Secondary | ICD-10-CM | POA: Diagnosis not present

## 2017-06-06 DIAGNOSIS — M1990 Unspecified osteoarthritis, unspecified site: Secondary | ICD-10-CM | POA: Diagnosis not present

## 2017-06-06 DIAGNOSIS — Z471 Aftercare following joint replacement surgery: Secondary | ICD-10-CM | POA: Diagnosis not present

## 2017-06-06 NOTE — Telephone Encounter (Signed)
Please note

## 2017-06-09 DIAGNOSIS — I10 Essential (primary) hypertension: Secondary | ICD-10-CM | POA: Diagnosis not present

## 2017-06-09 DIAGNOSIS — Z471 Aftercare following joint replacement surgery: Secondary | ICD-10-CM | POA: Diagnosis not present

## 2017-06-09 DIAGNOSIS — M1990 Unspecified osteoarthritis, unspecified site: Secondary | ICD-10-CM | POA: Diagnosis not present

## 2017-06-09 DIAGNOSIS — R269 Unspecified abnormalities of gait and mobility: Secondary | ICD-10-CM | POA: Diagnosis not present

## 2017-06-10 ENCOUNTER — Encounter: Payer: Self-pay | Admitting: Internal Medicine

## 2017-06-10 ENCOUNTER — Ambulatory Visit (INDEPENDENT_AMBULATORY_CARE_PROVIDER_SITE_OTHER): Payer: Medicare HMO | Admitting: Internal Medicine

## 2017-06-10 VITALS — BP 152/70 | HR 70 | Temp 97.5°F | Ht 67.0 in | Wt 182.6 lb

## 2017-06-10 DIAGNOSIS — N189 Chronic kidney disease, unspecified: Secondary | ICD-10-CM | POA: Diagnosis not present

## 2017-06-10 DIAGNOSIS — M7989 Other specified soft tissue disorders: Secondary | ICD-10-CM | POA: Diagnosis not present

## 2017-06-10 DIAGNOSIS — N289 Disorder of kidney and ureter, unspecified: Secondary | ICD-10-CM

## 2017-06-10 DIAGNOSIS — Z794 Long term (current) use of insulin: Secondary | ICD-10-CM | POA: Diagnosis not present

## 2017-06-10 DIAGNOSIS — E0821 Diabetes mellitus due to underlying condition with diabetic nephropathy: Secondary | ICD-10-CM

## 2017-06-10 DIAGNOSIS — E1122 Type 2 diabetes mellitus with diabetic chronic kidney disease: Secondary | ICD-10-CM | POA: Diagnosis not present

## 2017-06-10 DIAGNOSIS — M79662 Pain in left lower leg: Secondary | ICD-10-CM

## 2017-06-10 DIAGNOSIS — I10 Essential (primary) hypertension: Secondary | ICD-10-CM | POA: Diagnosis not present

## 2017-06-10 LAB — CBC WITH DIFFERENTIAL/PLATELET
Basophils Absolute: 0 10*3/uL (ref 0.0–0.1)
Basophils Relative: 0.5 % (ref 0.0–3.0)
Eosinophils Absolute: 0.2 10*3/uL (ref 0.0–0.7)
Eosinophils Relative: 2.5 % (ref 0.0–5.0)
HCT: 28.2 % — ABNORMAL LOW (ref 36.0–46.0)
Hemoglobin: 9.2 g/dL — ABNORMAL LOW (ref 12.0–15.0)
Lymphocytes Relative: 23.8 % (ref 12.0–46.0)
Lymphs Abs: 1.5 10*3/uL (ref 0.7–4.0)
MCHC: 32.6 g/dL (ref 30.0–36.0)
MCV: 80.7 fl (ref 78.0–100.0)
Monocytes Absolute: 0.7 10*3/uL (ref 0.1–1.0)
Monocytes Relative: 11.4 % (ref 3.0–12.0)
Neutro Abs: 3.9 10*3/uL (ref 1.4–7.7)
Neutrophils Relative %: 61.8 % (ref 43.0–77.0)
Platelets: 416 10*3/uL — ABNORMAL HIGH (ref 150.0–400.0)
RBC: 3.5 Mil/uL — ABNORMAL LOW (ref 3.87–5.11)
RDW: 16 % — ABNORMAL HIGH (ref 11.5–15.5)
WBC: 6.3 10*3/uL (ref 4.0–10.5)

## 2017-06-10 LAB — BASIC METABOLIC PANEL
BUN: 18 mg/dL (ref 6–23)
CALCIUM: 8.8 mg/dL (ref 8.4–10.5)
CO2: 21 meq/L (ref 19–32)
CREATININE: 2.11 mg/dL — AB (ref 0.40–1.20)
Chloride: 107 mEq/L (ref 96–112)
GFR: 30.06 mL/min — ABNORMAL LOW (ref >60.00)
Glucose, Bld: 76 mg/dL (ref 70–99)
Potassium: 3.5 mEq/L (ref 3.5–5.1)
Sodium: 142 mEq/L (ref 135–145)

## 2017-06-10 MED ORDER — HYDROCODONE-ACETAMINOPHEN 5-325 MG PO TABS: 1.0000 | ORAL_TABLET | Freq: Four times a day (QID) | ORAL | 0 refills | Status: DC | PRN

## 2017-06-10 NOTE — Patient Instructions (Signed)
Limit your sodium (Salt) intake   Please check your hemoglobin A1c every 3 months  Return in one month for follow-up   

## 2017-06-10 NOTE — Progress Notes (Signed)
Subjective:    Patient ID: Sarah [name], female    DOB: Apr 04, 1951, 66 y.o.   MRN: [id number]  HPI  66 year old patient who is status post left total knee replacement therapy on June 1 She has diabetes, essential hypertension, chronic kidney disease. She is receiving in-home rehabilitation.  She is complaining of increasing swelling involving her lower extremities, primarily the left leg. Medical regimen includes amlodipine 10 mg daily as well as furosemide when necessary edema. She states her diabetes has done well following surgery. Post discharge medical regimen reviewed.  The patient has been prescribed oxycodone, which she has not filled.  Prior to surgery.  The patient was on hydrocodone  Past Medical History:  Diagnosis Date  . Allergy   . Anxiety    history   . Arthritis   . Cataract    history  . Chronic kidney disease   . Diabetes mellitus   . Gout   . Heart murmur   . Hyperlipidemia   . Hypertension   . Peripheral neuropathy   . PONV (postoperative nausea and vomiting)   . Stroke [facility])      Social History   Social History  . Marital status: Single    Spouse name: N/A  . Number of children: N/A  . Years of education: N/A   Occupational History  . Not on file.   Social History Main Topics  . Smoking status: Never Smoker  . Smokeless tobacco: Never Used  . Alcohol use No  . Drug use: No  . Sexual activity: Not on file   Other Topics Concern  . Not on file   Social History Narrative  . No narrative on file    Past Surgical History:  Procedure Laterality Date  . APPENDECTOMY    . CATARACT EXTRACTION, BILATERAL    . EYE SURGERY    . FRACTURE SURGERY     L foot  . HEMORRHOID SURGERY    . NECK SURGERY    . SPINE SURGERY     cervical spine  . TOTAL KNEE ARTHROPLASTY Left 05/16/2017   Procedure: TOTAL KNEE ARTHROPLASTY;  Surgeon: Dorna Leitz, MD;  Location: [place];  Service: Orthopedics;  Laterality: Left;  . TUBAL LIGATION      Family  History  Problem Relation Age of Onset  . Cancer Mother   . Hypertension Mother   . Diabetes Mother   . Heart disease Father     Allergies  Allergen Reactions  . Metoprolol Swelling    SWELLING REACTION UNSPECIFIED   . Cefaclor Hives  . Tetanus Toxoid Adsorbed Swelling    SWELLING REACTION UNSPECIFIED     Current Outpatient Prescriptions on File Prior to Visit  Medication Sig Dispense Refill  . ACCU-CHEK FASTCLIX LANCETS MISC USE TO CHECK BLOOD SUGAR THREE TIMES A DAY BEFORE MEALS AND AT BEDTIME 306 each 0  . ACCU-CHEK SMARTVIEW test strip USE TO CHECK BLOOD SUGAR THREE TIMES A DAY BEFORE MEALS AND AT BEDTIME 400 each 3  . allopurinol (ZYLOPRIM) 300 MG tablet Take 1 tablet (300 mg total) by mouth daily. 90 tablet 3  . amLODipine (NORVASC) 10 MG tablet Take 1 tablet (10 mg total) by mouth daily. 90 tablet 3  . aspirin EC 325 MG tablet Take 1 tablet (325 mg total) by mouth 2 (two) times daily after a meal. Take x 1 month post op to decrease risk of blood clots. 60 tablet 0  . atorvastatin (LIPITOR) 80 MG tablet TAKE 1 TABLET (  80 MG TOTAL) BY MOUTH DAILY. 90 tablet 3  . Blood Glucose Monitoring Suppl (ACCU-CHEK NANO SMARTVIEW) w/Device KIT USE AS DIRECTED 1 kit 0  . COLCRYS 0.6 MG tablet Take 1 tablet (0.6 mg total) by mouth daily. (Patient taking differently: Take 0.6 mg by mouth daily as needed (gout flares). ) 10 tablet 5  . furosemide (LASIX) 20 MG tablet Take 1 tablet (20 mg total) by mouth daily. 30 tablet 1  . insulin aspart (NOVOLOG FLEXPEN) 100 UNIT/ML FlexPen 20 units prior to each meal 15 pen 1  . insulin glargine (LANTUS) 100 UNIT/ML injection 30 units at bedtime (Patient taking differently: Inject 30 Units into the skin at bedtime. ) 15 mL 6  . Insulin Pen Needle (NOVOFINE) 30G X 8 MM MISC Inject 10 each into the skin as needed. 90 each 3  . LORazepam (ATIVAN) 0.5 MG tablet Take 1 tablet (0.5 mg total) by mouth 2 (two) times daily as needed for anxiety. 180 tablet 1  .  potassium bicarbonate (KLOR-CON/EF) 25 MEQ disintegrating tablet Take 25 mEq by mouth daily. In 6 oz of water     No current facility-administered medications on file prior to visit.     BP (!) 152/70 (BP Location: Left Arm, Patient Position: Sitting, Cuff Size: Normal)   Pulse 70   Temp 97.5 F (36.4 C) (Oral)   Ht '5\' 7"'  (1.702 m)   Wt 182 lb 9.6 oz (82.8 kg)   SpO2 98%   BMI 28.60 kg/m     Review of Systems  Constitutional: Negative.   HENT: Negative for congestion, dental problem, hearing loss, rhinorrhea, sinus pressure, sore throat and tinnitus.   Eyes: Negative for pain, discharge and visual disturbance.  Respiratory: Negative for cough and shortness of breath.   Cardiovascular: Positive for leg swelling. Negative for chest pain and palpitations.  Gastrointestinal: Negative for abdominal distention, abdominal pain, blood in stool, constipation, diarrhea, nausea and vomiting.  Genitourinary: Negative for difficulty urinating, dysuria, flank pain, frequency, hematuria, pelvic pain, urgency, vaginal bleeding, vaginal discharge and vaginal pain.  Musculoskeletal: Negative for arthralgias, gait problem and joint swelling.  Skin: Negative for rash.  Neurological: Negative for dizziness, syncope, speech difficulty, weakness, numbness and headaches.  Hematological: Negative for adenopathy.  Psychiatric/Behavioral: Negative for agitation, behavioral problems and dysphoric mood. The patient is not nervous/anxious.        Objective:   Physical Exam  Constitutional: She is oriented to person, place, and time. She appears well-developed and well-nourished.  HENT:  Head: Normocephalic.  Right Ear: External ear normal.  Left Ear: External ear normal.  Mouth/Throat: Oropharynx is clear and moist.  Eyes: Conjunctivae and EOM are normal. Pupils are equal, round, and reactive to light.  Neck: Normal range of motion. Neck supple. No thyromegaly present.  Cardiovascular: Normal rate,  regular rhythm and intact distal pulses.   Murmur heard. Pulmonary/Chest: Effort normal and breath sounds normal.  Abdominal: Soft. Bowel sounds are normal. She exhibits no mass. There is no tenderness.  Musculoskeletal: Normal range of motion.  Surgical scar left knee healing nicely Considerable swelling of the left knee and distal leg Plus 2 right ankle and pedal edema  Lymphadenopathy:    She has no cervical adenopathy.  Neurological: She is alert and oriented to person, place, and time.  Skin: Skin is warm and dry. No rash noted.  Psychiatric: She has a normal mood and affect. Her behavior is normal.          Assessment &  Plan:   Status post left total knee replacement surgery Left knee swelling.  Will check venous Doppler to exclude DVT Essential hypertension, stable Diabetes mellitus.  Continue present therapy  All medications updated Oxycodone discontinued and hydrocodone substituted  Follow-up one month  Nyoka Cowden

## 2017-06-11 ENCOUNTER — Ambulatory Visit (HOSPITAL_COMMUNITY)
Admission: RE | Admit: 2017-06-11 | Discharge: 2017-06-11 | Disposition: A | Discharge status: Home / Self Care | Payer: Medicare HMO | Source: Ambulatory Visit | Attending: Internal Medicine | Admitting: Internal Medicine

## 2017-06-11 DIAGNOSIS — M79662 Pain in left lower leg: Secondary | ICD-10-CM

## 2017-06-11 DIAGNOSIS — M7989 Other specified soft tissue disorders: Secondary | ICD-10-CM

## 2017-06-11 NOTE — Progress Notes (Signed)
*  PRELIMINARY RESULTS* Vascular Ultrasound Left lower extremity venous duplex has been completed.  Preliminary findings: No evidence of DVT or baker's cyst.     Called results to Patty.  Farrel DemarkJill Eunice, RDMS, RVT  06/11/2017, 9:03 AM

## 2017-06-12 DIAGNOSIS — I10 Essential (primary) hypertension: Secondary | ICD-10-CM | POA: Diagnosis not present

## 2017-06-12 DIAGNOSIS — R269 Unspecified abnormalities of gait and mobility: Secondary | ICD-10-CM | POA: Diagnosis not present

## 2017-06-12 DIAGNOSIS — Z471 Aftercare following joint replacement surgery: Secondary | ICD-10-CM | POA: Diagnosis not present

## 2017-06-12 DIAGNOSIS — M1990 Unspecified osteoarthritis, unspecified site: Secondary | ICD-10-CM | POA: Diagnosis not present

## 2017-06-16 DIAGNOSIS — M1712 Unilateral primary osteoarthritis, left knee: Secondary | ICD-10-CM | POA: Diagnosis not present

## 2017-06-17 DIAGNOSIS — F419 Anxiety disorder, unspecified: Secondary | ICD-10-CM | POA: Diagnosis not present

## 2017-06-17 DIAGNOSIS — Z96652 Presence of left artificial knee joint: Secondary | ICD-10-CM | POA: Diagnosis not present

## 2017-06-17 DIAGNOSIS — N189 Chronic kidney disease, unspecified: Secondary | ICD-10-CM | POA: Diagnosis not present

## 2017-06-17 DIAGNOSIS — M109 Gout, unspecified: Secondary | ICD-10-CM | POA: Diagnosis not present

## 2017-06-17 DIAGNOSIS — I129 Hypertensive chronic kidney disease with stage 1 through stage 4 chronic kidney disease, or unspecified chronic kidney disease: Secondary | ICD-10-CM | POA: Diagnosis not present

## 2017-06-17 DIAGNOSIS — Z471 Aftercare following joint replacement surgery: Secondary | ICD-10-CM | POA: Diagnosis not present

## 2017-06-17 DIAGNOSIS — E1122 Type 2 diabetes mellitus with diabetic chronic kidney disease: Secondary | ICD-10-CM | POA: Diagnosis not present

## 2017-06-17 DIAGNOSIS — M1990 Unspecified osteoarthritis, unspecified site: Secondary | ICD-10-CM | POA: Diagnosis not present

## 2017-06-17 DIAGNOSIS — E1142 Type 2 diabetes mellitus with diabetic polyneuropathy: Secondary | ICD-10-CM | POA: Diagnosis not present

## 2017-06-19 ENCOUNTER — Telehealth: Payer: Self-pay | Admitting: Internal Medicine

## 2017-06-19 DIAGNOSIS — F419 Anxiety disorder, unspecified: Secondary | ICD-10-CM | POA: Diagnosis not present

## 2017-06-19 DIAGNOSIS — M109 Gout, unspecified: Secondary | ICD-10-CM | POA: Diagnosis not present

## 2017-06-19 DIAGNOSIS — N189 Chronic kidney disease, unspecified: Secondary | ICD-10-CM | POA: Diagnosis not present

## 2017-06-19 DIAGNOSIS — I129 Hypertensive chronic kidney disease with stage 1 through stage 4 chronic kidney disease, or unspecified chronic kidney disease: Secondary | ICD-10-CM | POA: Diagnosis not present

## 2017-06-19 DIAGNOSIS — E1122 Type 2 diabetes mellitus with diabetic chronic kidney disease: Secondary | ICD-10-CM | POA: Diagnosis not present

## 2017-06-19 DIAGNOSIS — Z96652 Presence of left artificial knee joint: Secondary | ICD-10-CM | POA: Diagnosis not present

## 2017-06-19 DIAGNOSIS — M1990 Unspecified osteoarthritis, unspecified site: Secondary | ICD-10-CM | POA: Diagnosis not present

## 2017-06-19 DIAGNOSIS — Z471 Aftercare following joint replacement surgery: Secondary | ICD-10-CM | POA: Diagnosis not present

## 2017-06-19 DIAGNOSIS — E1142 Type 2 diabetes mellitus with diabetic polyneuropathy: Secondary | ICD-10-CM | POA: Diagnosis not present

## 2017-06-19 NOTE — Telephone Encounter (Signed)
Pt [facility]. Pt request refill   cyclobenzaprine (FLEXERIL) 5 MG tablet   [facility] [phone] - [place], [place] - [address] [facility] [address]

## 2017-06-20 ENCOUNTER — Telehealth: Payer: Self-pay

## 2017-06-20 MED ORDER — CYCLOBENZAPRINE HCL 5 MG PO TABS: 5.0000 mg | ORAL_TABLET | Freq: Three times a day (TID) | ORAL | 0 refills | Status: DC | PRN

## 2017-06-20 NOTE — Telephone Encounter (Signed)
Received PA request for Cyclobenzaprine. PA submitted & pending. Key: [id number]

## 2017-06-20 NOTE — Telephone Encounter (Signed)
Okay, #30 

## 2017-06-20 NOTE — Telephone Encounter (Signed)
Rx sent 

## 2017-06-23 DIAGNOSIS — Z96652 Presence of left artificial knee joint: Secondary | ICD-10-CM | POA: Diagnosis not present

## 2017-06-23 DIAGNOSIS — R531 Weakness: Secondary | ICD-10-CM | POA: Diagnosis not present

## 2017-06-23 DIAGNOSIS — M25662 Stiffness of left knee, not elsewhere classified: Secondary | ICD-10-CM | POA: Diagnosis not present

## 2017-06-23 NOTE — Telephone Encounter (Signed)
PA approved, form faxed back to pharmacy. 

## 2017-06-24 ENCOUNTER — Encounter: Payer: Self-pay | Admitting: Internal Medicine

## 2017-06-24 ENCOUNTER — Ambulatory Visit (INDEPENDENT_AMBULATORY_CARE_PROVIDER_SITE_OTHER): Payer: Medicare HMO | Admitting: Internal Medicine

## 2017-06-24 VITALS — BP 140/72 | HR 73 | Temp 97.8°F | Ht 67.0 in | Wt 178.2 lb

## 2017-06-24 DIAGNOSIS — E0821 Diabetes mellitus due to underlying condition with diabetic nephropathy: Secondary | ICD-10-CM

## 2017-06-24 DIAGNOSIS — N289 Disorder of kidney and ureter, unspecified: Secondary | ICD-10-CM | POA: Diagnosis not present

## 2017-06-24 DIAGNOSIS — Z794 Long term (current) use of insulin: Secondary | ICD-10-CM | POA: Diagnosis not present

## 2017-06-24 DIAGNOSIS — I10 Essential (primary) hypertension: Secondary | ICD-10-CM

## 2017-06-24 MED ORDER — INSULIN GLARGINE 100 UNIT/ML ~~LOC~~ SOLN: SUBCUTANEOUS | 6 refills | Status: AC

## 2017-06-24 MED ORDER — HYDROCODONE-ACETAMINOPHEN 10-325 MG PO TABS: 1.0000 | ORAL_TABLET | Freq: Three times a day (TID) | ORAL | 0 refills | Status: DC | PRN

## 2017-06-24 NOTE — Progress Notes (Signed)
Subjective:    Patient ID: Sarah [name], female    DOB: 1951/04/11, 66 y.o.   MRN: [id number]  HPI  66 year old patient who is seen today for follow-up of diabetes.  She is status post recent left total knee replacement surgery.  Her Vicodin was down titrated from 10 to 5 mg of hydrocodone which she has not tolerated well.  She continues to dissipate and rehabilitation. The swelling in the left leg has improved She has essential hypertension which has been stable  She states that she occasionally checks blood sugars through the night with readings as low as 55.  Denies any hypoglycemic symptoms. No hypoglycemia throughout the day, although occasional mildly low readings when she has a light meal  Past Medical History:  Diagnosis Date  . Allergy   . Anxiety    history   . Arthritis   . Cataract    history  . Chronic kidney disease   . Diabetes mellitus   . Gout   . Heart murmur   . Hyperlipidemia   . Hypertension   . Peripheral neuropathy   . PONV (postoperative nausea and vomiting)   . Stroke [facility])      Social History   Social History  . Marital status: Single    Spouse name: N/A  . Number of children: N/A  . Years of education: N/A   Occupational History  . Not on file.   Social History Main Topics  . Smoking status: Never Smoker  . Smokeless tobacco: Never Used  . Alcohol use No  . Drug use: No  . Sexual activity: Not on file   Other Topics Concern  . Not on file   Social History Narrative  . No narrative on file    Past Surgical History:  Procedure Laterality Date  . APPENDECTOMY    . CATARACT EXTRACTION, BILATERAL    . EYE SURGERY    . FRACTURE SURGERY     L foot  . HEMORRHOID SURGERY    . NECK SURGERY    . SPINE SURGERY     cervical spine  . TOTAL KNEE ARTHROPLASTY Left 05/16/2017   Procedure: TOTAL KNEE ARTHROPLASTY;  Surgeon: Dorna Leitz, MD;  Location: [place];  Service: Orthopedics;  Laterality: Left;  . TUBAL LIGATION      Family  History  Problem Relation Age of Onset  . Cancer Mother   . Hypertension Mother   . Diabetes Mother   . Heart disease Father     Allergies  Allergen Reactions  . Metoprolol Swelling    SWELLING REACTION UNSPECIFIED   . Cefaclor Hives  . Tetanus Toxoid Adsorbed Swelling    SWELLING REACTION UNSPECIFIED     Current Outpatient Prescriptions on File Prior to Visit  Medication Sig Dispense Refill  . ACCU-CHEK FASTCLIX LANCETS MISC USE TO CHECK BLOOD SUGAR THREE TIMES A DAY BEFORE MEALS AND AT BEDTIME 306 each 0  . ACCU-CHEK SMARTVIEW test strip USE TO CHECK BLOOD SUGAR THREE TIMES A DAY BEFORE MEALS AND AT BEDTIME 400 each 3  . allopurinol (ZYLOPRIM) 300 MG tablet Take 1 tablet (300 mg total) by mouth daily. 90 tablet 3  . amLODipine (NORVASC) 10 MG tablet Take 1 tablet (10 mg total) by mouth daily. 90 tablet 3  . aspirin EC 325 MG tablet Take 1 tablet (325 mg total) by mouth 2 (two) times daily after a meal. Take x 1 month post op to decrease risk of blood clots. 60 tablet 0  .  atorvastatin (LIPITOR) 80 MG tablet TAKE 1 TABLET (80 MG TOTAL) BY MOUTH DAILY. 90 tablet 3  . Blood Glucose Monitoring Suppl (ACCU-CHEK NANO SMARTVIEW) w/Device KIT USE AS DIRECTED 1 kit 0  . COLCRYS 0.6 MG tablet Take 1 tablet (0.6 mg total) by mouth daily. (Patient taking differently: Take 0.6 mg by mouth daily as needed (gout flares). ) 10 tablet 5  . cyclobenzaprine (FLEXERIL) 5 MG tablet Take 1 tablet (5 mg total) by mouth 3 (three) times daily as needed for muscle spasms. 30 tablet 0  . furosemide (LASIX) 20 MG tablet Take 1 tablet (20 mg total) by mouth daily. 30 tablet 1  . HYDROcodone-acetaminophen (NORCO/VICODIN) 5-325 MG tablet Take 1 tablet by mouth every 6 (six) hours as needed for moderate pain. 60 tablet 0  . insulin aspart (NOVOLOG FLEXPEN) 100 UNIT/ML FlexPen 20 units prior to each meal 15 pen 1  . insulin glargine (LANTUS) 100 UNIT/ML injection 30 units at bedtime (Patient taking differently:  Inject 30 Units into the skin at bedtime. ) 15 mL 6  . Insulin Pen Needle (NOVOFINE) 30G X 8 MM MISC Inject 10 each into the skin as needed. 90 each 3  . LORazepam (ATIVAN) 0.5 MG tablet Take 1 tablet (0.5 mg total) by mouth 2 (two) times daily as needed for anxiety. 180 tablet 1  . potassium bicarbonate (KLOR-CON/EF) 25 MEQ disintegrating tablet Take 25 mEq by mouth daily. In 6 oz of water     No current facility-administered medications on file prior to visit.     BP 140/72 (BP Location: Left Arm, Patient Position: Sitting, Cuff Size: Normal)   Pulse 73   Temp 97.8 F (36.6 C) (Oral)   Ht _0  (1.702 m)   Wt 178 lb 3.2 oz (80.8 kg)   SpO2 98%   BMI 27.91 kg/m     Review of Systems  Constitutional: Negative.   HENT: Negative for congestion, dental problem, hearing loss, rhinorrhea, sinus pressure, sore throat and tinnitus.   Eyes: Negative for pain, discharge and visual disturbance.  Respiratory: Negative for cough and shortness of breath.   Cardiovascular: Positive for leg swelling. Negative for chest pain and palpitations.  Gastrointestinal: Negative for abdominal distention, abdominal pain, blood in stool, constipation, diarrhea, nausea and vomiting.  Genitourinary: Negative for difficulty urinating, dysuria, flank pain, frequency, hematuria, pelvic pain, urgency, vaginal bleeding, vaginal discharge and vaginal pain.  Musculoskeletal: Positive for arthralgias and joint swelling. Negative for gait problem.  Skin: Negative for rash.  Neurological: Negative for dizziness, syncope, speech difficulty, weakness, numbness and headaches.  Hematological: Negative for adenopathy.  Psychiatric/Behavioral: Negative for agitation, behavioral problems and dysphoric mood. The patient is not nervous/anxious.        Objective:   Physical Exam  Constitutional: She is oriented to person, place, and time. She appears well-developed and well-nourished.  Blood pressure well controlled  HENT:    Head: Normocephalic.  Right Ear: External ear normal.  Left Ear: External ear normal.  Mouth/Throat: Oropharynx is clear and moist.  Eyes: Conjunctivae and EOM are normal. Pupils are equal, round, and reactive to light.  Neck: Normal range of motion. Neck supple. No thyromegaly present.  Cardiovascular: Normal rate, regular rhythm, normal heart sounds and intact distal pulses.   Pulmonary/Chest: Effort normal and breath sounds normal.  Abdominal: Soft. Bowel sounds are normal. She exhibits no mass. There is no tenderness.  Musculoskeletal: Normal range of motion.  Status post recent left TKA Minimal left leg swelling  Lymphadenopathy:  She has no cervical adenopathy.  Neurological: She is alert and oriented to person, place, and time.  Skin: Skin is warm and dry. No rash noted.  Psychiatric: She has a normal mood and affect. Her behavior is normal.          Assessment & Plan:   Diabetes mellitus.  Patient has some nocturnal hypoglycemia.  We'll decrease Lantus from 20 units to 10 units.  Continue mealtime insulin Hypertension, stable Status post left total knee replacement  Follow-up 2 months  [email or web address],PETER Pilar Plate

## 2017-06-24 NOTE — Patient Instructions (Addendum)
WE NOW OFFER   [facility] FAST TRACK!!!  SAME DAY Appointments for ACUTE CARE  Such as: Sprains, Injuries, cuts, abrasions, rashes, muscle pain, joint pain, back pain Colds, flu, sore throats, headache, allergies, cough, fever  Ear pain, sinus and eye infections Abdominal pain, nausea, vomiting, diarrhea, upset stomach Animal/insect bites  3 Easy Ways to Schedule: Walk-In Scheduling Call in scheduling Mychart Sign-up: [email or web address]   Return in 2 months for follow-up  Decrease Lantus to 10 units at bedtime  Continue physical therapy

## 2017-06-26 DIAGNOSIS — R531 Weakness: Secondary | ICD-10-CM | POA: Diagnosis not present

## 2017-06-26 DIAGNOSIS — M25662 Stiffness of left knee, not elsewhere classified: Secondary | ICD-10-CM | POA: Diagnosis not present

## 2017-06-26 DIAGNOSIS — Z96652 Presence of left artificial knee joint: Secondary | ICD-10-CM | POA: Diagnosis not present

## 2017-06-27 ENCOUNTER — Inpatient Hospital Stay (HOSPITAL_COMMUNITY)
Admission: EM | Admit: 2017-06-27 | Discharge: 2017-07-02 | DRG: 280 | Disposition: A | Discharge status: Home / Self Care | Payer: Medicare HMO | Attending: Nephrology | Admitting: Nephrology

## 2017-06-27 ENCOUNTER — Encounter (HOSPITAL_COMMUNITY): Payer: Self-pay | Admitting: Emergency Medicine

## 2017-06-27 ENCOUNTER — Telehealth: Payer: Self-pay

## 2017-06-27 ENCOUNTER — Ambulatory Visit (HOSPITAL_COMMUNITY)
Admission: EM | Admit: 2017-06-27 | Discharge: 2017-06-27 | Disposition: A | Discharge status: Nursing Home (Includes ICF) | Payer: Medicare HMO | Attending: Emergency Medicine | Admitting: Emergency Medicine

## 2017-06-27 ENCOUNTER — Encounter (HOSPITAL_COMMUNITY): Payer: Self-pay | Admitting: *Deleted

## 2017-06-27 ENCOUNTER — Ambulatory Visit (INDEPENDENT_AMBULATORY_CARE_PROVIDER_SITE_OTHER): Payer: Medicare HMO

## 2017-06-27 DIAGNOSIS — F419 Anxiety disorder, unspecified: Secondary | ICD-10-CM | POA: Diagnosis present

## 2017-06-27 DIAGNOSIS — J9601 Acute respiratory failure with hypoxia: Secondary | ICD-10-CM | POA: Diagnosis not present

## 2017-06-27 DIAGNOSIS — R0602 Shortness of breath: Secondary | ICD-10-CM

## 2017-06-27 DIAGNOSIS — R0989 Other specified symptoms and signs involving the circulatory and respiratory systems: Secondary | ICD-10-CM | POA: Diagnosis not present

## 2017-06-27 DIAGNOSIS — I21A1 Myocardial infarction type 2: Secondary | ICD-10-CM | POA: Diagnosis not present

## 2017-06-27 DIAGNOSIS — E7800 Pure hypercholesterolemia, unspecified: Secondary | ICD-10-CM | POA: Diagnosis present

## 2017-06-27 DIAGNOSIS — R0603 Acute respiratory distress: Secondary | ICD-10-CM

## 2017-06-27 DIAGNOSIS — Z888 Allergy status to other drugs, medicaments and biological substances status: Secondary | ICD-10-CM | POA: Diagnosis not present

## 2017-06-27 DIAGNOSIS — Z7982 Long term (current) use of aspirin: Secondary | ICD-10-CM | POA: Diagnosis not present

## 2017-06-27 DIAGNOSIS — J9600 Acute respiratory failure, unspecified whether with hypoxia or hypercapnia: Secondary | ICD-10-CM | POA: Diagnosis present

## 2017-06-27 DIAGNOSIS — Y95 Nosocomial condition: Secondary | ICD-10-CM | POA: Diagnosis present

## 2017-06-27 DIAGNOSIS — R0902 Hypoxemia: Secondary | ICD-10-CM | POA: Diagnosis not present

## 2017-06-27 DIAGNOSIS — J189 Pneumonia, unspecified organism: Secondary | ICD-10-CM | POA: Diagnosis present

## 2017-06-27 DIAGNOSIS — D631 Anemia in chronic kidney disease: Secondary | ICD-10-CM | POA: Diagnosis present

## 2017-06-27 DIAGNOSIS — Z794 Long term (current) use of insulin: Secondary | ICD-10-CM

## 2017-06-27 DIAGNOSIS — E0821 Diabetes mellitus due to underlying condition with diabetic nephropathy: Secondary | ICD-10-CM | POA: Diagnosis not present

## 2017-06-27 DIAGNOSIS — Z79899 Other long term (current) drug therapy: Secondary | ICD-10-CM

## 2017-06-27 DIAGNOSIS — I10 Essential (primary) hypertension: Secondary | ICD-10-CM | POA: Diagnosis present

## 2017-06-27 DIAGNOSIS — R059 Cough, unspecified: Secondary | ICD-10-CM

## 2017-06-27 DIAGNOSIS — E872 Acidosis: Secondary | ICD-10-CM | POA: Diagnosis not present

## 2017-06-27 DIAGNOSIS — I5033 Acute on chronic diastolic (congestive) heart failure: Secondary | ICD-10-CM | POA: Diagnosis present

## 2017-06-27 DIAGNOSIS — J811 Chronic pulmonary edema: Secondary | ICD-10-CM

## 2017-06-27 DIAGNOSIS — J810 Acute pulmonary edema: Secondary | ICD-10-CM | POA: Diagnosis not present

## 2017-06-27 DIAGNOSIS — I130 Hypertensive heart and chronic kidney disease with heart failure and stage 1 through stage 4 chronic kidney disease, or unspecified chronic kidney disease: Principal | ICD-10-CM | POA: Diagnosis present

## 2017-06-27 DIAGNOSIS — N179 Acute kidney failure, unspecified: Secondary | ICD-10-CM | POA: Diagnosis present

## 2017-06-27 DIAGNOSIS — M109 Gout, unspecified: Secondary | ICD-10-CM | POA: Diagnosis present

## 2017-06-27 DIAGNOSIS — Z96652 Presence of left artificial knee joint: Secondary | ICD-10-CM | POA: Diagnosis present

## 2017-06-27 DIAGNOSIS — M549 Dorsalgia, unspecified: Secondary | ICD-10-CM | POA: Diagnosis present

## 2017-06-27 DIAGNOSIS — M1990 Unspecified osteoarthritis, unspecified site: Secondary | ICD-10-CM | POA: Diagnosis present

## 2017-06-27 DIAGNOSIS — R05 Cough: Secondary | ICD-10-CM | POA: Diagnosis not present

## 2017-06-27 DIAGNOSIS — Z887 Allergy status to serum and vaccine status: Secondary | ICD-10-CM

## 2017-06-27 DIAGNOSIS — Z8673 Personal history of transient ischemic attack (TIA), and cerebral infarction without residual deficits: Secondary | ICD-10-CM

## 2017-06-27 DIAGNOSIS — R079 Chest pain, unspecified: Secondary | ICD-10-CM | POA: Diagnosis not present

## 2017-06-27 DIAGNOSIS — E1142 Type 2 diabetes mellitus with diabetic polyneuropathy: Secondary | ICD-10-CM | POA: Diagnosis present

## 2017-06-27 DIAGNOSIS — E785 Hyperlipidemia, unspecified: Secondary | ICD-10-CM | POA: Diagnosis present

## 2017-06-27 DIAGNOSIS — E1136 Type 2 diabetes mellitus with diabetic cataract: Secondary | ICD-10-CM | POA: Diagnosis present

## 2017-06-27 DIAGNOSIS — G8929 Other chronic pain: Secondary | ICD-10-CM | POA: Diagnosis present

## 2017-06-27 DIAGNOSIS — N184 Chronic kidney disease, stage 4 (severe): Secondary | ICD-10-CM | POA: Diagnosis not present

## 2017-06-27 DIAGNOSIS — R748 Abnormal levels of other serum enzymes: Secondary | ICD-10-CM | POA: Diagnosis not present

## 2017-06-27 DIAGNOSIS — I350 Nonrheumatic aortic (valve) stenosis: Secondary | ICD-10-CM | POA: Diagnosis not present

## 2017-06-27 DIAGNOSIS — E1129 Type 2 diabetes mellitus with other diabetic kidney complication: Secondary | ICD-10-CM | POA: Diagnosis present

## 2017-06-27 DIAGNOSIS — E1122 Type 2 diabetes mellitus with diabetic chronic kidney disease: Secondary | ICD-10-CM | POA: Diagnosis not present

## 2017-06-27 DIAGNOSIS — Z79891 Long term (current) use of opiate analgesic: Secondary | ICD-10-CM

## 2017-06-27 DIAGNOSIS — R069 Unspecified abnormalities of breathing: Secondary | ICD-10-CM | POA: Diagnosis not present

## 2017-06-27 HISTORY — DX: Chronic diastolic (congestive) heart failure: I50.32

## 2017-06-27 LAB — COMPREHENSIVE METABOLIC PANEL
ALBUMIN: 3.5 g/dL (ref 3.5–5.0)
ALK PHOS: 93 U/L (ref 38–126)
ALT: 16 U/L (ref 14–54)
ANION GAP: 13 (ref 5–15)
AST: 40 U/L (ref 15–41)
BILIRUBIN TOTAL: 1.4 mg/dL — AB (ref 0.3–1.2)
BUN: 25 mg/dL — AB (ref 6–20)
CALCIUM: 8.7 mg/dL — AB (ref 8.9–10.3)
CO2: 21 mmol/L — AB (ref 22–32)
CREATININE: 2.35 mg/dL — AB (ref 0.44–1.00)
Chloride: 102 mmol/L (ref 101–111)
GFR calc Af Amer: 24 mL/min — ABNORMAL LOW (ref >60)
GFR calc non Af Amer: 20 mL/min — ABNORMAL LOW (ref >60)
GLUCOSE: 226 mg/dL — AB (ref 65–99)
Potassium: 4.9 mmol/L (ref 3.5–5.1)
SODIUM: 136 mmol/L (ref 135–145)
TOTAL PROTEIN: 7.3 g/dL (ref 6.5–8.1)

## 2017-06-27 LAB — I-STAT CG4 LACTIC ACID, ED: Lactic Acid, Venous: 2.23 mmol/L (ref 0.5–1.9)

## 2017-06-27 LAB — I-STAT ARTERIAL BLOOD GAS, ED
Acid-base deficit: 2 mmol/L (ref 0.0–2.0)
Bicarbonate: 21.5 mmol/L (ref 20.0–28.0)
O2 SAT: 87 %
PCO2 ART: 29.2 mmHg — AB (ref 32.0–48.0)
PH ART: 7.473 — AB (ref 7.350–7.450)
PO2 ART: 47 mmHg — AB (ref 83.0–108.0)
Patient temperature: 97.9
TCO2: 22 mmol/L (ref 0–100)

## 2017-06-27 LAB — CBC WITH DIFFERENTIAL/PLATELET
BASOS PCT: 0 %
Basophils Absolute: 0 10*3/uL (ref 0.0–0.1)
Eosinophils Absolute: 0 10*3/uL (ref 0.0–0.7)
Eosinophils Relative: 0 %
HEMATOCRIT: 30.5 % — AB (ref 36.0–46.0)
HEMOGLOBIN: 10.1 g/dL — AB (ref 12.0–15.0)
LYMPHS ABS: 1.4 10*3/uL (ref 0.7–4.0)
Lymphocytes Relative: 13 %
MCH: 25.5 pg — ABNORMAL LOW (ref 26.0–34.0)
MCHC: 33.1 g/dL (ref 30.0–36.0)
MCV: 77 fL — ABNORMAL LOW (ref 78.0–100.0)
MONOS PCT: 8 %
Monocytes Absolute: 0.9 10*3/uL (ref 0.1–1.0)
NEUTROS ABS: 8.4 10*3/uL — AB (ref 1.7–7.7)
NEUTROS PCT: 79 %
Platelets: 451 10*3/uL — ABNORMAL HIGH (ref 150–400)
RBC: 3.96 MIL/uL (ref 3.87–5.11)
RDW: 14.8 % (ref 11.5–15.5)
WBC: 10.7 10*3/uL — AB (ref 4.0–10.5)

## 2017-06-27 LAB — I-STAT TROPONIN, ED: Troponin i, poc: 0.15 ng/mL (ref 0.00–0.08)

## 2017-06-27 LAB — BRAIN NATRIURETIC PEPTIDE: B Natriuretic Peptide: 370.9 pg/mL — ABNORMAL HIGH (ref 0.0–100.0)

## 2017-06-27 MED ORDER — LEVOFLOXACIN IN D5W 750 MG/150ML IV SOLN
750.0000 mg | Freq: Once | INTRAVENOUS | Status: AC
  Administered 2017-06-28: 750 mg via INTRAVENOUS
  Filled 2017-06-27: qty 150

## 2017-06-27 MED ORDER — ASPIRIN 81 MG PO CHEW
324.0000 mg | CHEWABLE_TABLET | Freq: Once | ORAL | Status: AC
  Administered 2017-06-27: 324 mg via ORAL
  Filled 2017-06-27: qty 4

## 2017-06-27 MED ORDER — ALBUTEROL SULFATE (2.5 MG/3ML) 0.083% IN NEBU
5.0000 mg | INHALATION_SOLUTION | Freq: Once | RESPIRATORY_TRACT | Status: AC
  Administered 2017-06-27: 5 mg via RESPIRATORY_TRACT
  Filled 2017-06-27: qty 6

## 2017-06-27 MED ORDER — VANCOMYCIN HCL IN DEXTROSE 1-5 GM/200ML-% IV SOLN
1000.0000 mg | Freq: Once | INTRAVENOUS | Status: AC
  Administered 2017-06-28: 1000 mg via INTRAVENOUS
  Filled 2017-06-27: qty 200

## 2017-06-27 NOTE — Telephone Encounter (Signed)
Patient called with c/o nausea and emesis since last night. She states that she went to PT yesterday and was doing well. She came home last night and started getting sick. She has productive cough with yellow mucus and states that whenever she tries to lay down she starts coughing and then vomits. She denies any headache, visual changes, facial droop, trouble walking, speech changes, strength changes or other s/s pertaining to stroke. She has not been able to keep anything on her stomach all day and is concerned she may be getting dehydrated. Advised [facility]. She states that she will have her sister take her immediately. Nothing further needed at this time.

## 2017-06-27 NOTE — ED Triage Notes (Signed)
[facility]. Sent here for CHF/pnuemonia workup.

## 2017-06-27 NOTE — ED Triage Notes (Addendum)
Pt   States   She  Has  Had  Nausea     And  A  Productive   Cough   Since  yest    She  Reports  Pain  In her  Chest  From  Coughing    She  Has  Metal  Plates     In  Her     Neck     As   Well   Pt  Sob  On  Exertion   And  Has   Some  Swelling  Of   Extremities

## 2017-06-27 NOTE — ED Notes (Signed)
22  Angio  l  Hand  1   Att   Monitor  Nasal  o2   At  2  l  /  Min  Hob  Elevated

## 2017-06-27 NOTE — ED Notes (Signed)
Report  Phoned  To  [place]   Charge  Nurse

## 2017-06-27 NOTE — Progress Notes (Signed)
RT has placed patient on HFNC([place]) of 12L. Patient tolerating well. RT will continue to monitor.

## 2017-06-27 NOTE — ED Provider Notes (Signed)
HPI  SUBJECTIVE:  Sarah [name] is a 66 y.o. female who presents with cough productive of foamy white yellow sputum starting last night. She reports posttussive emesis but denies any other vomiting. She reports 30 minutes of dull left-sided chest pain today at rest. She did not get up and walk around with this so it is unknown if it had any exertional component to it. She denies radiation to neck, arm, back. No nausea, diaphoresis, palpitations. No water brash, burning chest pain, belching. States that she's had similar chest pain before but did not seek medical attention. She reports wheezing, shortness of breath for the past several weeks and dyspnea on exertion. Sister states that she can only walk 4-5 steps today. No fevers. No new lower extremity edema. Reports nocturia, denies unintentional weight gain, abdominal pain. No orthopnea. She is approximately 6 weeks postop left total knee replacement. She states that symptoms are worse with walking, lying down flat, no alleviating factors. She has not tried anything for this. She has an extensive past medical history including diabetes, stroke, hypertension, chronic kidney disease, hypercholesterolemia. She was worked up for DVT last week which was negative. She also has a murmur. No history of MI, coronary disease, smoking, GERD, asthma, emphysema, COPD, CHF. STM:[id number], Doretha Sou, MD     Past Medical History:  Diagnosis Date  . Allergy   . Anxiety    history   . Arthritis   . Cataract    history  . Chronic kidney disease   . Diabetes mellitus   . Gout   . Heart murmur   . Hyperlipidemia   . Hypertension   . Peripheral neuropathy   . PONV (postoperative nausea and vomiting)   . Stroke [facility])     Past Surgical History:  Procedure Laterality Date  . APPENDECTOMY    . CATARACT EXTRACTION, BILATERAL    . EYE SURGERY    . FRACTURE SURGERY     L foot  . HEMORRHOID SURGERY    . NECK SURGERY    . SPINE SURGERY     cervical spine   . TOTAL KNEE ARTHROPLASTY Left 05/16/2017   Procedure: TOTAL KNEE ARTHROPLASTY;  Surgeon: Dorna Leitz, MD;  Location: [place];  Service: Orthopedics;  Laterality: Left;  . TUBAL LIGATION      Family History  Problem Relation Age of Onset  . Cancer Mother   . Hypertension Mother   . Diabetes Mother   . Heart disease Father     Social History  Substance Use Topics  . Smoking status: Never Smoker  . Smokeless tobacco: Never Used  . Alcohol use No    No current facility-administered medications for this encounter.   Current Outpatient Prescriptions:  .  ACCU-CHEK FASTCLIX LANCETS MISC, USE TO CHECK BLOOD SUGAR THREE TIMES A DAY BEFORE MEALS AND AT BEDTIME, Disp: 306 each, Rfl: 0 .  ACCU-CHEK SMARTVIEW test strip, USE TO CHECK BLOOD SUGAR THREE TIMES A DAY BEFORE MEALS AND AT BEDTIME, Disp: 400 each, Rfl: 3 .  allopurinol (ZYLOPRIM) 300 MG tablet, Take 1 tablet (300 mg total) by mouth daily., Disp: 90 tablet, Rfl: 3 .  amLODipine (NORVASC) 10 MG tablet, Take 1 tablet (10 mg total) by mouth daily., Disp: 90 tablet, Rfl: 3 .  aspirin EC 325 MG tablet, Take 1 tablet (325 mg total) by mouth 2 (two) times daily after a meal. Take x 1 month post op to decrease risk of blood clots., Disp: 60 tablet, Rfl: 0 .  atorvastatin (LIPITOR) 80 MG tablet, TAKE 1 TABLET (80 MG TOTAL) BY MOUTH DAILY., Disp: 90 tablet, Rfl: 3 .  Blood Glucose Monitoring Suppl (ACCU-CHEK NANO SMARTVIEW) w/Device KIT, USE AS DIRECTED, Disp: 1 kit, Rfl: 0 .  COLCRYS 0.6 MG tablet, Take 1 tablet (0.6 mg total) by mouth daily. (Patient taking differently: Take 0.6 mg by mouth daily as needed (gout flares). ), Disp: 10 tablet, Rfl: 5 .  cyclobenzaprine (FLEXERIL) 5 MG tablet, Take 1 tablet (5 mg total) by mouth 3 (three) times daily as needed for muscle spasms., Disp: 30 tablet, Rfl: 0 .  furosemide (LASIX) 20 MG tablet, Take 1 tablet (20 mg total) by mouth daily., Disp: 30 tablet, Rfl: 1 .  HYDROcodone-acetaminophen (NORCO) 10-325  MG tablet, Take 1 tablet by mouth every 8 (eight) hours as needed., Disp: 30 tablet, Rfl: 0 .  insulin aspart (NOVOLOG FLEXPEN) 100 UNIT/ML FlexPen, 20 units prior to each meal, Disp: 15 pen, Rfl: 1 .  insulin glargine (LANTUS) 100 UNIT/ML injection, 10 units at bedtime, Disp: 10 mL, Rfl: 6 .  Insulin Pen Needle (NOVOFINE) 30G X 8 MM MISC, Inject 10 each into the skin as needed., Disp: 90 each, Rfl: 3 .  LORazepam (ATIVAN) 0.5 MG tablet, Take 1 tablet (0.5 mg total) by mouth 2 (two) times daily as needed for anxiety., Disp: 180 tablet, Rfl: 1 .  potassium bicarbonate (KLOR-CON/EF) 25 MEQ disintegrating tablet, Take 25 mEq by mouth daily. In 6 oz of water, Disp: , Rfl:   Allergies  Allergen Reactions  . Metoprolol Swelling    SWELLING REACTION UNSPECIFIED   . Cefaclor Hives  . Tetanus Toxoid Adsorbed Swelling    SWELLING REACTION UNSPECIFIED      ROS  As noted in HPI.   Physical Exam  BP (!) 142/75 (BP Location: Right Arm)   Pulse 80   Temp 98.6 F (37 C) (Oral)   Resp 18   SpO2 95%   Constitutional: Well developed, well nourished, no acute distress Eyes: PERRL, EOMI, conjunctiva normal bilaterally HENT: Normocephalic, atraumatic,mucus membranes moist Respiratory: Increased inspiratory effort, using abdominal muscles to breathe. She is wheezing throughout all lung fields Cardiovascular: Normal rate and rhythm, no murmurs, no gallops, no rubs GI: Soft, nondistended, normal bowel sounds, nontender, no rebound, no guarding Back: no CVAT skin: No rash, skin intact Musculoskeletal: Bilateral 1-2+edema on the left more so than the right no tenderness, no deformities Neurologic: Alert & oriented x 3, CN II-XII grossly intact, no motor deficits, sensation grossly intact Psychiatric: Speech and behavior appropriate   ED Course   Medications - No data to display  Orders Placed This Encounter  Procedures  . DG Chest 2 View    Standing Status:   Standing    Number of  Occurrences:   1    Order Specific Question:   Reason for Exam (SYMPTOM  OR DIAGNOSIS REQUIRED)    Answer:   cough  . Cardiac monitoring    Standing Status:   Standing    Number of Occurrences:   1  . Keep Oxygen Setup At Bedside    Standing Status:   Standing    Number of Occurrences:   1  . EKG 12-Lead    Standing Status:   Standing    Number of Occurrences:   1  . ED EKG    Standing Status:   Standing    Number of Occurrences:   1    Order Specific Question:   Reason for  Exam    Answer:   Chest Pain  . Insert peripheral IV    Standing Status:   Standing    Number of Occurrences:   1   No results found for this or any previous visit (from the past 24 hour(s)). Dg Chest 2 View  Result Date: 06/27/2017 CLINICAL DATA:  66 y/o  F; cough, shortness of breath, nausea. EXAM: CHEST  2 VIEW COMPARISON:  05/09/2017 chest radiograph FINDINGS: Stable normal cardiac silhouette. Patchy diffuse pulmonary opacities greatest in mid and upper lung zones may represent alveolar pulmonary edema or multifocal pneumonia. No pleural effusion or pneumothorax. Anterior cervical fusion hardware noted. IMPRESSION: Patchy opacities in mid and upper lung zones may represent developing alveolar pulmonary edema or multifocal pneumonia. Electronically Signed   By: Kristine Garbe M.D.   On: 06/27/2017 21:06    ED Clinical Impression  Cough  Chest pain, unspecified type  Shortness of breath  ED Assessment/Plan  Reviewed imaging independently. Patchy opacities bilaterally concerning for developing pulmonary edema or multifocal pneumonia.  EKG: Normal sinus rhythm, rate 86. Normal axis, normal intervals. No ST-T wave elevation. No previous EKG for comparison.  Patient does have some increased work of breathing, so patient was placed on oxygen. Transferring to the [facility] ED via EMS to rule out pneumonia versus pulmonary edema.  Discussed  imaging, MDM, plan and followup with patient and family.  Patient and family. They agree with plan.  No orders of the defined types were placed in this encounter.   *This clinic note was created using Dragon dictation software. Therefore, there may be occasional mistakes despite careful proofreading.  ?   Melynda Ripple, MD 06/27/17 2133

## 2017-06-27 NOTE — ED Provider Notes (Signed)
[place] DEPT Provider Note   CSN: [id number] Arrival date & time: 06/27/17  2144     History   Chief Complaint Chief Complaint  Patient presents with  . Shortness of Breath    HPI Sarah [name] is a 66 y.o. female with a hx of Diabetes, stroke, hypertension, chronic kidney disease, high cholesterol presents to the Emergency Department complaining of gradual, persistent, progressively worsening shortness of breath onset several days ago with acute exacerbation in the last 24 hours preventing her from sleeping last night. Patient reports that her shortness of breath is worse with exertion. Pt reports she has been sleeping sitting up for the last 3-4 days due to orthopnea.  Today she developed approximately 30 minutes of left-sided and dull chest pain while sitting on the porch without radiation.  No ASA has been given.  Patient denies previous cardiac history, fevers, chills, headache, neck pain, abdominal pain, nausea, vomiting. She denies a history of asthma, emphysema, CHF, COPD or smoking. She also denies a history of known coronary artery disease or MI.  Pt is 6 weeks post op from a L knee total replacement by Dr. Berenice Primas on 05/16/17.  She has had post op knee swelling and had a venous duplex last week that was negative.    PCP: Bluford Kaufmann, MD   The history is provided by the patient and medical records. No language interpreter was used.    Past Medical History:  Diagnosis Date  . Allergy   . Anxiety    history   . Arthritis   . Cataract    history  . Chronic kidney disease   . Diabetes mellitus   . Gout   . Heart murmur   . Hyperlipidemia   . Hypertension   . Peripheral neuropathy   . PONV (postoperative nausea and vomiting)   . Stroke [facility])     Patient Active Problem List   Diagnosis Date Noted  . Acute respiratory failure ([place]) 06/28/2017  . Renal insufficiency 05/19/2017  . Primary osteoarthritis of left knee 05/16/2017  . Disorder of kidney and  ureter 06/29/2009  . ANXIETY 07/01/2008  . Diabetes mellitus with renal complications ([place]) 92/12/69  . Pure hypercholesterolemia 05/22/2007  . Essential hypertension 05/22/2007  . History of cardiovascular disorder 05/22/2007  . ARTHROSCOPY, KNEE, HX OF 05/22/2007    Past Surgical History:  Procedure Laterality Date  . APPENDECTOMY    . CATARACT EXTRACTION, BILATERAL    . EYE SURGERY    . FRACTURE SURGERY     L foot  . HEMORRHOID SURGERY    . NECK SURGERY    . SPINE SURGERY     cervical spine  . TOTAL KNEE ARTHROPLASTY Left 05/16/2017   Procedure: TOTAL KNEE ARTHROPLASTY;  Surgeon: Dorna Leitz, MD;  Location: [place];  Service: Orthopedics;  Laterality: Left;  . TUBAL LIGATION      OB History    No data available       Home Medications    Prior to Admission medications   Medication Sig Start Date End Date Taking? Authorizing Provider  allopurinol (ZYLOPRIM) 300 MG tablet Take 1 tablet (300 mg total) by mouth daily. Patient taking differently: Take 300 mg by mouth daily as needed.  12/23/16  Yes Marletta Lor, MD  amLODipine (NORVASC) 10 MG tablet Take 1 tablet (10 mg total) by mouth daily. 12/23/16  Yes Marletta Lor, MD  aspirin EC 325 MG tablet Take 1 tablet (325 mg total) by mouth 2 (  two) times daily after a meal. Take x 1 month post op to decrease risk of blood clots. 05/16/17  Yes Gary Fleet, PA-C  atorvastatin (LIPITOR) 80 MG tablet TAKE 1 TABLET (80 MG TOTAL) BY MOUTH DAILY. 12/23/16  Yes Marletta Lor, MD  COLCRYS 0.6 MG tablet Take 1 tablet (0.6 mg total) by mouth daily. Patient taking differently: Take 0.6 mg by mouth daily as needed (gout flares).  12/23/16  Yes Marletta Lor, MD  cyclobenzaprine (FLEXERIL) 5 MG tablet Take 1 tablet (5 mg total) by mouth 3 (three) times daily as needed for muscle spasms. 06/20/17  Yes Marletta Lor, MD  furosemide (LASIX) 20 MG tablet Take 1 tablet (20 mg total) by mouth daily. 02/18/17  Yes Marletta Lor, MD  HYDROcodone-acetaminophen [facility]) 10-325 MG tablet Take 1 tablet by mouth every 8 (eight) hours as needed. Patient taking differently: Take 1 tablet by mouth every 8 (eight) hours as needed for moderate pain.  06/24/17  Yes Marletta Lor, MD  insulin aspart (NOVOLOG FLEXPEN) 100 UNIT/ML FlexPen 20 units prior to each meal 12/31/16  Yes Marletta Lor, MD  insulin glargine (LANTUS) 100 UNIT/ML injection 10 units at bedtime 06/24/17  Yes Marletta Lor, MD  lidocaine (LIDODERM) 5 % Place 1 patch onto the skin daily. Remove & Discard patch within 12 hours or as directed by MD   Yes [provider]  ACCU-CHEK FASTCLIX LANCETS MISC USE TO [place] A DAY BEFORE MEALS AND AT BEDTIME Patient not taking: Reported on 06/27/2017 02/04/17   Marletta Lor, MD  ACCU-CHEK SMARTVIEW test strip USE TO CHECK BLOOD SUGAR THREE TIMES A DAY BEFORE MEALS AND AT BEDTIME Patient not taking: Reported on 06/27/2017 02/04/17   Marletta Lor, MD  Blood Glucose Monitoring Suppl (ACCU-CHEK NANO SMARTVIEW) w/Device KIT USE AS DIRECTED Patient not taking: Reported on 06/27/2017 12/23/16   Marletta Lor, MD  Insulin Pen Needle (NOVOFINE) 30G X 8 MM MISC Inject 10 each into the skin as needed. Patient not taking: Reported on 06/27/2017 12/23/16   Marletta Lor, MD  LORazepam (ATIVAN) 0.5 MG tablet Take 1 tablet (0.5 mg total) by mouth 2 (two) times daily as needed for anxiety. Patient not taking: Reported on 06/27/2017 12/23/16   Marletta Lor, MD  potassium bicarbonate (KLOR-CON/EF) 25 MEQ disintegrating tablet Take 25 mEq by mouth daily. In 6 oz of water    [provider]    Family History Family History  Problem Relation Age of Onset  . Cancer Mother   . Hypertension Mother   . Diabetes Mother   . Heart disease Father     Social History Social History  Substance Use Topics  . Smoking status: Never Smoker  . Smokeless tobacco:  Never Used  . Alcohol use No     Allergies   Metoprolol; Cefaclor; and Tetanus toxoid adsorbed   Review of Systems Review of Systems  Constitutional: Negative for appetite change, diaphoresis, fatigue, fever and unexpected weight change.  HENT: Negative for mouth sores.   Eyes: Negative for visual disturbance.  Respiratory: Positive for chest tightness and shortness of breath. Negative for cough and wheezing.   Cardiovascular: Positive for chest pain ( resolved) and leg swelling ( left knee). Negative for palpitations.  Gastrointestinal: Negative for abdominal pain, constipation, diarrhea, nausea and vomiting.  Endocrine: Negative for polydipsia, polyphagia and polyuria.  Genitourinary: Negative for dysuria, frequency, hematuria and urgency.  Musculoskeletal: Negative  for back pain and neck stiffness.  Skin: Negative for rash.  Allergic/Immunologic: Negative for immunocompromised state.  Neurological: Negative for syncope, light-headedness and headaches.  Hematological: Does not bruise/bleed easily.  Psychiatric/Behavioral: Negative for sleep disturbance. The patient is not nervous/anxious.   All other systems reviewed and are negative.    Physical Exam Updated Vital Signs BP (!) 157/73 (BP Location: Right Arm)   Pulse 87   Temp 97.7 F (36.5 C) (Oral)   Resp 20   Ht '5\' 7"'  (1.702 m)   Wt 80.7 kg (178 lb)   SpO2 92%   BMI 27.88 kg/m   Physical Exam  Constitutional: She appears well-developed and well-nourished. She appears distressed.  Awake, alert   HENT:  Head: Normocephalic and atraumatic.  Mouth/Throat: Oropharynx is clear and moist. No oropharyngeal exudate.  Eyes: Conjunctivae are normal. No scleral icterus.  Neck: Normal range of motion. Neck supple. No JVD present.  Cardiovascular: Normal rate, regular rhythm and intact distal pulses.   No murmur heard. Pulmonary/Chest: Accessory muscle usage present. Tachypnea noted. She is in respiratory distress. She has  decreased breath sounds. She has wheezes (faint, expiratory). She has rhonchi.  Equal chest expansion  Abdominal: Soft. Bowel sounds are normal. She exhibits no mass. There is no tenderness. There is no rebound and no guarding.  Musculoskeletal: Normal range of motion. She exhibits edema ( trace).  Left knee with well healing surgical incision, edema and increased warmth No pitting edema of the calves, ankles or feet Negative Homans sign, no calf tenderness  Lymphadenopathy:    She has no cervical adenopathy.  Neurological: She is alert.  Speech is clear and goal oriented Moves extremities without ataxia  Skin: Skin is warm and dry. She is not diaphoretic.  Psychiatric: She has a normal mood and affect.  Nursing note and vitals reviewed.    ED Treatments / Results  Labs (all labs ordered are listed, but only abnormal results are displayed) Labs Reviewed  CBC WITH DIFFERENTIAL/PLATELET - Abnormal; Notable for the following:       Result Value   WBC 10.7 (*)    Hemoglobin 10.1 (*)    HCT 30.5 (*)    MCV 77.0 (*)    MCH 25.5 (*)    Platelets 451 (*)    Neutro Abs 8.4 (*)    All other components within normal limits  COMPREHENSIVE METABOLIC PANEL - Abnormal; Notable for the following:    CO2 21 (*)    Glucose, Bld 226 (*)    BUN 25 (*)    Creatinine, Ser 2.35 (*)    Calcium 8.7 (*)    Total Bilirubin 1.4 (*)    GFR calc non Af Amer 20 (*)    GFR calc Af Amer 24 (*)    All other components within normal limits  BRAIN NATRIURETIC PEPTIDE - Abnormal; Notable for the following:    B Natriuretic Peptide 370.9 (*)    All other components within normal limits  I-STAT CG4 LACTIC ACID, ED - Abnormal; Notable for the following:    Lactic Acid, Venous 2.23 (*)    All other components within normal limits  I-STAT TROPOININ, ED - Abnormal; Notable for the following:    Troponin i, poc 0.15 (*)    All other components within normal limits  I-STAT ARTERIAL BLOOD GAS, ED - Abnormal;  Notable for the following:    pH, Arterial 7.473 (*)    pCO2 arterial 29.2 (*)    pO2, Arterial 47.0 (*)  All other components within normal limits  BLOOD GAS, ARTERIAL    EKG  EKG Interpretation  Date/Time:  Friday June 27 2017 21:55:33 EDT Ventricular Rate:  85 PR Interval:    QRS Duration: 96 QT Interval:  389 QTC Calculation: 463 R Axis:   51 Text Interpretation:  Sinus rhythm Ventricular premature complex Nonspecific ST depression Baseline wander in lead(s) V6 No significant change since last tracing Confirmed by Dorie Rank [phone]) on 06/27/2017 11:55:37 PM        Radiology Dg Chest 2 View  Result Date: 06/27/2017 CLINICAL DATA:  66 y/o  F; cough, shortness of breath, nausea. EXAM: CHEST  2 VIEW COMPARISON:  05/09/2017 chest radiograph FINDINGS: Stable normal cardiac silhouette. Patchy diffuse pulmonary opacities greatest in mid and upper lung zones may represent alveolar pulmonary edema or multifocal pneumonia. No pleural effusion or pneumothorax. Anterior cervical fusion hardware noted. IMPRESSION: Patchy opacities in mid and upper lung zones may represent developing alveolar pulmonary edema or multifocal pneumonia. Electronically Signed   By: Kristine Garbe M.D.   On: 06/27/2017 21:06    Procedures Procedures (including critical care time)  CRITICAL CARE Performed by: Abigail Butts Total critical care time: 45 minutes Critical care time was exclusive of separately billable procedures and treating other patients. Critical care was necessary to treat or prevent imminent or life-threatening deterioration. Critical care was time spent personally by me on the following activities: development of treatment plan with patient and/or surrogate as well as nursing, discussions with consultants, evaluation of patient's response to treatment, examination of patient, obtaining history from patient or surrogate, ordering and performing treatments and interventions,  ordering and review of laboratory studies, ordering and review of radiographic studies, pulse oximetry and re-evaluation of patient's condition.   Medications Ordered in ED Medications  levofloxacin (LEVAQUIN) IVPB 750 mg (not administered)  vancomycin (VANCOCIN) IVPB 1000 mg/200 mL premix (not administered)  furosemide (LASIX) injection 40 mg (not administered)  aspirin chewable tablet 324 mg (324 mg Oral Given 06/27/17 2250)  albuterol (PROVENTIL) (2.5 MG/3ML) 0.083% nebulizer solution 5 mg (5 mg Nebulization Given 06/27/17 2251)     Initial Impression / Assessment and Plan / ED Course  I have reviewed the triage vital signs and the nursing notes.  Pertinent labs & imaging results that were available during my care of the patient were reviewed by me and considered in my medical decision making (see chart for details).     Patient presents with acute worsening shortness of breath after the last 24 hours. She initially presented to urgent care where chest x-ray revealed a patchy opacities in the mid and upper lung zones concerning for potential pulmonary edema or multifocal yes. Patient has no history of either. She is afebrile here in the emergency department however she continues to have hypoxia on 5 L via nasal cannula. Patient does not wear oxygen at home. She is visibly distressed, tachypnea with accessory muscle usage. Concern for possible PE versus pneumonia vs CHF. Patient without hypotension.  Patient with slightly elevated lactic acid. She remains afebrile. Leukocytosis of 10.7. Patient with elevated troponin at 0.15; suspect this is a troponin leak versus an STEMI. EKG is without ST elevation or significant depression. Patient continues to have hypoxia on high flow nasal cannula. Will be placed on CPAP. Minimal improvement after albuterol.  Patient given 40 mg of Lasix and antibiotics. Serum creatinine is 2.35 therefore we will be unable to obtain CT angio.  BNP 370. Discussed with Dr.  Hal Hope who  will admit.   The patient was discussed with and seen by Dr. Acquanetta Belling who agrees with the treatment plan.   Final Clinical Impressions(s) / ED Diagnoses   Final diagnoses:  SOB (shortness of breath)  Hypoxia  Respiratory distress    New Prescriptions New Prescriptions   No medications on file     Agapito Games 06/28/17 4069

## 2017-06-27 NOTE — ED Notes (Signed)
Care  Link     Called  No  Unit  Ems   Notified

## 2017-06-28 ENCOUNTER — Encounter (HOSPITAL_COMMUNITY): Payer: Self-pay | Admitting: Internal Medicine

## 2017-06-28 DIAGNOSIS — M549 Dorsalgia, unspecified: Secondary | ICD-10-CM | POA: Diagnosis present

## 2017-06-28 DIAGNOSIS — Z794 Long term (current) use of insulin: Secondary | ICD-10-CM

## 2017-06-28 DIAGNOSIS — J810 Acute pulmonary edema: Secondary | ICD-10-CM | POA: Diagnosis not present

## 2017-06-28 DIAGNOSIS — E1122 Type 2 diabetes mellitus with diabetic chronic kidney disease: Secondary | ICD-10-CM | POA: Diagnosis present

## 2017-06-28 DIAGNOSIS — Z887 Allergy status to serum and vaccine status: Secondary | ICD-10-CM | POA: Diagnosis not present

## 2017-06-28 DIAGNOSIS — I5033 Acute on chronic diastolic (congestive) heart failure: Secondary | ICD-10-CM | POA: Diagnosis present

## 2017-06-28 DIAGNOSIS — Z7982 Long term (current) use of aspirin: Secondary | ICD-10-CM | POA: Diagnosis not present

## 2017-06-28 DIAGNOSIS — I350 Nonrheumatic aortic (valve) stenosis: Secondary | ICD-10-CM | POA: Diagnosis not present

## 2017-06-28 DIAGNOSIS — N184 Chronic kidney disease, stage 4 (severe): Secondary | ICD-10-CM | POA: Diagnosis present

## 2017-06-28 DIAGNOSIS — E0821 Diabetes mellitus due to underlying condition with diabetic nephropathy: Secondary | ICD-10-CM | POA: Diagnosis not present

## 2017-06-28 DIAGNOSIS — M109 Gout, unspecified: Secondary | ICD-10-CM | POA: Diagnosis present

## 2017-06-28 DIAGNOSIS — Z8673 Personal history of transient ischemic attack (TIA), and cerebral infarction without residual deficits: Secondary | ICD-10-CM | POA: Diagnosis not present

## 2017-06-28 DIAGNOSIS — J9601 Acute respiratory failure with hypoxia: Secondary | ICD-10-CM | POA: Diagnosis present

## 2017-06-28 DIAGNOSIS — Z888 Allergy status to other drugs, medicaments and biological substances status: Secondary | ICD-10-CM | POA: Diagnosis not present

## 2017-06-28 DIAGNOSIS — F419 Anxiety disorder, unspecified: Secondary | ICD-10-CM | POA: Diagnosis present

## 2017-06-28 DIAGNOSIS — I130 Hypertensive heart and chronic kidney disease with heart failure and stage 1 through stage 4 chronic kidney disease, or unspecified chronic kidney disease: Secondary | ICD-10-CM | POA: Diagnosis present

## 2017-06-28 DIAGNOSIS — N179 Acute kidney failure, unspecified: Secondary | ICD-10-CM | POA: Diagnosis present

## 2017-06-28 DIAGNOSIS — Y95 Nosocomial condition: Secondary | ICD-10-CM | POA: Diagnosis present

## 2017-06-28 DIAGNOSIS — R0603 Acute respiratory distress: Secondary | ICD-10-CM | POA: Diagnosis present

## 2017-06-28 DIAGNOSIS — M1990 Unspecified osteoarthritis, unspecified site: Secondary | ICD-10-CM | POA: Diagnosis present

## 2017-06-28 DIAGNOSIS — E872 Acidosis: Secondary | ICD-10-CM | POA: Diagnosis present

## 2017-06-28 DIAGNOSIS — D631 Anemia in chronic kidney disease: Secondary | ICD-10-CM | POA: Diagnosis present

## 2017-06-28 DIAGNOSIS — J189 Pneumonia, unspecified organism: Secondary | ICD-10-CM | POA: Diagnosis present

## 2017-06-28 DIAGNOSIS — E1136 Type 2 diabetes mellitus with diabetic cataract: Secondary | ICD-10-CM | POA: Diagnosis present

## 2017-06-28 DIAGNOSIS — G8929 Other chronic pain: Secondary | ICD-10-CM | POA: Diagnosis present

## 2017-06-28 DIAGNOSIS — R0902 Hypoxemia: Secondary | ICD-10-CM | POA: Diagnosis not present

## 2017-06-28 DIAGNOSIS — I10 Essential (primary) hypertension: Secondary | ICD-10-CM | POA: Diagnosis not present

## 2017-06-28 DIAGNOSIS — E785 Hyperlipidemia, unspecified: Secondary | ICD-10-CM | POA: Diagnosis present

## 2017-06-28 DIAGNOSIS — I21A1 Myocardial infarction type 2: Secondary | ICD-10-CM | POA: Diagnosis present

## 2017-06-28 DIAGNOSIS — J9600 Acute respiratory failure, unspecified whether with hypoxia or hypercapnia: Secondary | ICD-10-CM | POA: Diagnosis not present

## 2017-06-28 DIAGNOSIS — R748 Abnormal levels of other serum enzymes: Secondary | ICD-10-CM | POA: Diagnosis not present

## 2017-06-28 DIAGNOSIS — Z96652 Presence of left artificial knee joint: Secondary | ICD-10-CM | POA: Diagnosis present

## 2017-06-28 DIAGNOSIS — E7800 Pure hypercholesterolemia, unspecified: Secondary | ICD-10-CM | POA: Diagnosis present

## 2017-06-28 DIAGNOSIS — E1142 Type 2 diabetes mellitus with diabetic polyneuropathy: Secondary | ICD-10-CM | POA: Diagnosis present

## 2017-06-28 LAB — CBC
HCT: 29.1 % — ABNORMAL LOW (ref 36.0–46.0)
HCT: 28.7 % — ABNORMAL LOW (ref 36.0–46.0)
HEMOGLOBIN: 9.4 g/dL — AB (ref 12.0–15.0)
Hemoglobin: 9.2 g/dL — ABNORMAL LOW (ref 12.0–15.0)
MCH: 25.4 pg — ABNORMAL LOW (ref 26.0–34.0)
MCH: 24.9 pg — ABNORMAL LOW (ref 26.0–34.0)
MCHC: 32.3 g/dL (ref 30.0–36.0)
MCHC: 32.1 g/dL (ref 30.0–36.0)
MCV: 78.6 fL (ref 78.0–100.0)
MCV: 77.6 fL — AB (ref 78.0–100.0)
PLATELETS: 399 10*3/uL (ref 150–400)
Platelets: 364 10*3/uL (ref 150–400)
RBC: 3.7 MIL/uL — ABNORMAL LOW (ref 3.87–5.11)
RBC: 3.7 MIL/uL — ABNORMAL LOW (ref 3.87–5.11)
RDW: 14.9 % (ref 11.5–15.5)
RDW: 14.7 % (ref 11.5–15.5)
WBC: 11.7 10*3/uL — ABNORMAL HIGH (ref 4.0–10.5)
WBC: 11.4 10*3/uL — AB (ref 4.0–10.5)

## 2017-06-28 LAB — URINALYSIS, ROUTINE W REFLEX MICROSCOPIC
BILIRUBIN URINE: NEGATIVE
Glucose, UA: 150 mg/dL — AB
KETONES UR: NEGATIVE mg/dL
NITRITE: NEGATIVE
Protein, ur: 100 mg/dL — AB
SPECIFIC GRAVITY, URINE: 1.015 (ref 1.005–1.030)
pH: 5 (ref 5.0–8.0)

## 2017-06-28 LAB — LACTIC ACID, PLASMA
LACTIC ACID, VENOUS: 5.4 mmol/L — AB (ref 0.5–1.9)
LACTIC ACID, VENOUS: 3.8 mmol/L — AB (ref 0.5–1.9)
LACTIC ACID, VENOUS: 3.6 mmol/L — AB (ref 0.5–1.9)
Lactic Acid, Venous: 2.4 mmol/L (ref 0.5–1.9)
Lactic Acid, Venous: 3.1 mmol/L (ref 0.5–1.9)

## 2017-06-28 LAB — D-DIMER, QUANTITATIVE (NOT AT ARMC): D DIMER QUANT: 4.23 ug{FEU}/mL — AB (ref 0.00–0.50)

## 2017-06-28 LAB — BASIC METABOLIC PANEL
Anion gap: 14 (ref 5–15)
BUN: 26 mg/dL — AB (ref 6–20)
CALCIUM: 8.8 mg/dL — AB (ref 8.9–10.3)
CHLORIDE: 104 mmol/L (ref 101–111)
CO2: 18 mmol/L — ABNORMAL LOW (ref 22–32)
CREATININE: 2.44 mg/dL — AB (ref 0.44–1.00)
GFR calc Af Amer: 23 mL/min — ABNORMAL LOW (ref >60)
GFR calc non Af Amer: 20 mL/min — ABNORMAL LOW (ref >60)
Glucose, Bld: 243 mg/dL — ABNORMAL HIGH (ref 65–99)
Potassium: 3.9 mmol/L (ref 3.5–5.1)
SODIUM: 136 mmol/L (ref 135–145)

## 2017-06-28 LAB — HEPATIC FUNCTION PANEL
ALBUMIN: 3.4 g/dL — AB (ref 3.5–5.0)
ALT: 11 U/L — ABNORMAL LOW (ref 14–54)
AST: 27 U/L (ref 15–41)
Alkaline Phosphatase: 90 U/L (ref 38–126)
BILIRUBIN DIRECT: 0.2 mg/dL (ref 0.1–0.5)
BILIRUBIN TOTAL: 1 mg/dL (ref 0.3–1.2)
Indirect Bilirubin: 0.8 mg/dL (ref 0.3–0.9)
Total Protein: 7.5 g/dL (ref 6.5–8.1)

## 2017-06-28 LAB — GLUCOSE, CAPILLARY
Glucose-Capillary: 252 mg/dL — ABNORMAL HIGH (ref 65–99)
Glucose-Capillary: 153 mg/dL — ABNORMAL HIGH (ref 65–99)
Glucose-Capillary: 196 mg/dL — ABNORMAL HIGH (ref 65–99)

## 2017-06-28 LAB — STREP PNEUMONIAE URINARY ANTIGEN: STREP PNEUMO URINARY ANTIGEN: NEGATIVE

## 2017-06-28 LAB — CREATININE, SERUM
CREATININE: 2.56 mg/dL — AB (ref 0.44–1.00)
GFR calc Af Amer: 21 mL/min — ABNORMAL LOW (ref >60)
GFR, EST NON AFRICAN AMERICAN: 18 mL/min — AB (ref >60)

## 2017-06-28 LAB — PROCALCITONIN
Procalcitonin: <0.1 ng/mL
Procalcitonin: 0.14 ng/mL

## 2017-06-28 LAB — TROPONIN I
TROPONIN I: 0.37 ng/mL — AB (ref <0.03)
TROPONIN I: 0.9 ng/mL — AB (ref <0.03)
Troponin I: 0.27 ng/mL (ref <0.03)
Troponin I: 1.5 ng/mL (ref <0.03)

## 2017-06-28 LAB — HEPARIN LEVEL (UNFRACTIONATED)
HEPARIN UNFRACTIONATED: 0.44 [IU]/mL (ref 0.30–0.70)
Heparin Unfractionated: 0.62 IU/mL (ref 0.30–0.70)

## 2017-06-28 LAB — MRSA PCR SCREENING: MRSA by PCR: NEGATIVE

## 2017-06-28 LAB — CBG MONITORING, ED: Glucose-Capillary: 248 mg/dL — ABNORMAL HIGH (ref 65–99)

## 2017-06-28 MED ORDER — HEPARIN SODIUM (PORCINE) 5000 UNIT/ML IJ SOLN: 5000.0000 [IU] | Freq: Three times a day (TID) | INTRAMUSCULAR | Status: DC

## 2017-06-28 MED ORDER — ONDANSETRON HCL 4 MG PO TABS: 4.0000 mg | ORAL_TABLET | Freq: Four times a day (QID) | ORAL | Status: DC | PRN

## 2017-06-28 MED ORDER — HEPARIN BOLUS VIA INFUSION
4500.0000 [IU] | Freq: Once | INTRAVENOUS | Status: AC
  Administered 2017-06-28: 4500 [IU] via INTRAVENOUS
  Filled 2017-06-28: qty 4500

## 2017-06-28 MED ORDER — ACETAMINOPHEN 325 MG PO TABS
650.0000 mg | ORAL_TABLET | Freq: Four times a day (QID) | ORAL | Status: DC | PRN
  Filled 2017-06-28: qty 2

## 2017-06-28 MED ORDER — FUROSEMIDE 10 MG/ML IJ SOLN
40.0000 mg | Freq: Once | INTRAMUSCULAR | Status: AC
  Administered 2017-06-28: 40 mg via INTRAVENOUS
  Filled 2017-06-28: qty 4

## 2017-06-28 MED ORDER — HEPARIN (PORCINE) IN NACL 100-0.45 UNIT/ML-% IJ SOLN
1250.0000 [IU]/h | INTRAMUSCULAR | Status: DC
  Administered 2017-06-28 – 2017-06-30 (×3): 1250 [IU]/h via INTRAVENOUS
  Filled 2017-06-28 (×5): qty 250

## 2017-06-28 MED ORDER — VANCOMYCIN HCL IN DEXTROSE 1-5 GM/200ML-% IV SOLN
1000.0000 mg | INTRAVENOUS | Status: DC
  Administered 2017-06-29: 1000 mg via INTRAVENOUS
  Filled 2017-06-28: qty 200

## 2017-06-28 MED ORDER — DEXTROSE 5 % IV SOLN
1.0000 g | Freq: Three times a day (TID) | INTRAVENOUS | Status: DC
  Administered 2017-06-28 – 2017-06-30 (×7): 1 g via INTRAVENOUS
  Filled 2017-06-28 (×10): qty 1

## 2017-06-28 MED ORDER — ACETAMINOPHEN 650 MG RE SUPP: 650.0000 mg | Freq: Four times a day (QID) | RECTAL | Status: DC | PRN

## 2017-06-28 MED ORDER — IPRATROPIUM-ALBUTEROL 0.5-2.5 (3) MG/3ML IN SOLN
3.0000 mL | Freq: Four times a day (QID) | RESPIRATORY_TRACT | Status: DC
  Administered 2017-06-28 (×3): 3 mL via RESPIRATORY_TRACT
  Filled 2017-06-28 (×3): qty 3

## 2017-06-28 MED ORDER — FUROSEMIDE 10 MG/ML IJ SOLN
40.0000 mg | Freq: Two times a day (BID) | INTRAMUSCULAR | Status: DC
  Administered 2017-06-28 – 2017-07-01 (×6): 40 mg via INTRAVENOUS
  Filled 2017-06-28 (×6): qty 4

## 2017-06-28 MED ORDER — FUROSEMIDE 10 MG/ML IJ SOLN
60.0000 mg | Freq: Once | INTRAMUSCULAR | Status: AC
  Administered 2017-06-28: 60 mg via INTRAVENOUS
  Filled 2017-06-28: qty 6

## 2017-06-28 MED ORDER — INSULIN ASPART 100 UNIT/ML ~~LOC~~ SOLN
0.0000 [IU] | SUBCUTANEOUS | Status: DC
  Administered 2017-06-28: 2 [IU] via SUBCUTANEOUS
  Administered 2017-06-28: 3 [IU] via SUBCUTANEOUS
  Administered 2017-06-28: 2 [IU] via SUBCUTANEOUS
  Administered 2017-06-28: 5 [IU] via SUBCUTANEOUS
  Administered 2017-06-29: 2 [IU] via SUBCUTANEOUS
  Administered 2017-06-29 (×2): 1 [IU] via SUBCUTANEOUS
  Administered 2017-06-29: 2 [IU] via SUBCUTANEOUS
  Administered 2017-06-29 (×2): 3 [IU] via SUBCUTANEOUS
  Administered 2017-06-29: 5 [IU] via SUBCUTANEOUS
  Administered 2017-06-30: 1 [IU] via SUBCUTANEOUS
  Administered 2017-06-30: 5 [IU] via SUBCUTANEOUS
  Administered 2017-06-30 (×2): 1 [IU] via SUBCUTANEOUS
  Administered 2017-06-30: 3 [IU] via SUBCUTANEOUS
  Administered 2017-07-01: 2 [IU] via SUBCUTANEOUS
  Administered 2017-07-01: 3 [IU] via SUBCUTANEOUS
  Administered 2017-07-01: 1 [IU] via SUBCUTANEOUS
  Administered 2017-07-01: 3 [IU] via SUBCUTANEOUS
  Administered 2017-07-01: 1 [IU] via SUBCUTANEOUS
  Administered 2017-07-02: 2 [IU] via SUBCUTANEOUS
  Administered 2017-07-02: 5 [IU] via SUBCUTANEOUS
  Filled 2017-06-28: qty 1

## 2017-06-28 MED ORDER — ONDANSETRON HCL 4 MG/2ML IJ SOLN: 4.0000 mg | Freq: Four times a day (QID) | INTRAMUSCULAR | Status: DC | PRN

## 2017-06-28 MED ORDER — LIDOCAINE 5 % EX PTCH
1.0000 | MEDICATED_PATCH | CUTANEOUS | Status: DC
  Administered 2017-06-28 – 2017-07-02 (×5): 1 via TRANSDERMAL
  Filled 2017-06-28 (×6): qty 1

## 2017-06-28 NOTE — Progress Notes (Signed)
CRITICAL VALUE ALERT  Critical Value:  Troponin 1.50  Date & Time Notied:  Noticed in chart and notified MD - not called by Lab  Provider Notified: K. Sofia - [facility] night coverage  Orders Received/Actions taken: orders given to recheck troponin in 6 hours. Also requested order to recheck Lactic Acid.   Noe GensStefanie A Benito Lemmerman, RN

## 2017-06-28 NOTE — Progress Notes (Addendum)
ANTICOAGULATION CONSULT NOTE - Follow Up Consult  Pharmacy Consult for Heparin Indication: pulmonary embolus  Allergies  Allergen Reactions  . Metoprolol Swelling    SWELLING REACTION UNSPECIFIED   . Cefaclor Hives  . Tetanus Toxoid Adsorbed Swelling    SWELLING REACTION UNSPECIFIED     Patient Measurements: Height: '5\' 7"'  (170.2 cm) Weight: 178 lb (80.7 kg) IBW/kg (Calculated) : 61.6 Heparin Dosing Weight: 78.1 kg  Vital Signs: BP: 147/69 (07/14 0815) Pulse Rate: 85 (07/14 0815)  Labs:  Recent Labs  06/27/17 2240 06/28/17 0230 06/28/17 0408 06/28/17 1023  HGB 10.1* 9.4* 9.2*  --   HCT 30.5* 29.1* 28.7*  --   PLT 451* 364 399  --   HEPARINUNFRC  --   --   --  0.62  CREATININE 2.35* 2.56* 2.44*  --   TROPONINI  --  0.27* 0.37* 0.90*    Estimated Creatinine Clearance: 24.8 mL/min (A) (by C-G formula based on SCr of 2.44 mg/dL (H)).   Medications:  Prescriptions Prior to Admission  Medication Sig Dispense Refill Last Dose  . allopurinol (ZYLOPRIM) 300 MG tablet Take 1 tablet (300 mg total) by mouth daily. (Patient taking differently: Take 300 mg by mouth daily as needed. ) 90 tablet 3 prn  . amLODipine (NORVASC) 10 MG tablet Take 1 tablet (10 mg total) by mouth daily. 90 tablet 3 06/27/2017 at Unknown time  . aspirin EC 325 MG tablet Take 1 tablet (325 mg total) by mouth 2 (two) times daily after a meal. Take x 1 month post op to decrease risk of blood clots. 60 tablet 0 06/27/2017 at Unknown time  . atorvastatin (LIPITOR) 80 MG tablet TAKE 1 TABLET (80 MG TOTAL) BY MOUTH DAILY. 90 tablet 3 06/26/2017 at Unknown time  . COLCRYS 0.6 MG tablet Take 1 tablet (0.6 mg total) by mouth daily. (Patient taking differently: Take 0.6 mg by mouth daily as needed (gout flares). ) 10 tablet 5 prn  . cyclobenzaprine (FLEXERIL) 5 MG tablet Take 1 tablet (5 mg total) by mouth 3 (three) times daily as needed for muscle spasms. 30 tablet 0 prn  . furosemide (LASIX) 20 MG tablet Take 1  tablet (20 mg total) by mouth daily. 30 tablet 1 06/26/2017 at Unknown time  . HYDROcodone-acetaminophen (NORCO) 10-325 MG tablet Take 1 tablet by mouth every 8 (eight) hours as needed. (Patient taking differently: Take 1 tablet by mouth every 8 (eight) hours as needed for moderate pain. ) 30 tablet 0 Past Week at Unknown time  . insulin aspart (NOVOLOG FLEXPEN) 100 UNIT/ML FlexPen 20 units prior to each meal 15 pen 1 06/27/2017 at Unknown time  . insulin glargine (LANTUS) 100 UNIT/ML injection 10 units at bedtime 10 mL 6 06/26/2017 at Unknown time  . lidocaine (LIDODERM) 5 % Place 1 patch onto the skin daily. Remove & Discard patch within 12 hours or as directed by MD   Past Week at Unknown time  . ACCU-CHEK FASTCLIX LANCETS MISC USE TO CHECK BLOOD SUGAR THREE TIMES A DAY BEFORE MEALS AND AT BEDTIME (Patient not taking: Reported on 06/27/2017) 306 each 0 Not Taking at Unknown time  . ACCU-CHEK SMARTVIEW test strip USE TO CHECK BLOOD SUGAR THREE TIMES A DAY BEFORE MEALS AND AT BEDTIME (Patient not taking: Reported on 06/27/2017) 400 each 3 Not Taking at Unknown time  . Blood Glucose Monitoring Suppl (ACCU-CHEK NANO SMARTVIEW) w/Device KIT USE AS DIRECTED (Patient not taking: Reported on 06/27/2017) 1 kit 0 Not Taking at Unknown  time  . Insulin Pen Needle (NOVOFINE) 30G X 8 MM MISC Inject 10 each into the skin as needed. (Patient not taking: Reported on 06/27/2017) 90 each 3 Not Taking at Unknown time  . LORazepam (ATIVAN) 0.5 MG tablet Take 1 tablet (0.5 mg total) by mouth 2 (two) times daily as needed for anxiety. (Patient not taking: Reported on 06/27/2017) 180 tablet 1 Not Taking at Unknown time  . potassium bicarbonate (KLOR-CON/EF) 25 MEQ disintegrating tablet Take 25 mEq by mouth daily. In 6 oz of water   Not Taking at Unknown time    Assessment: 66 yo F presents on 7/13 with SOB x several days that worsened greatly on 06/27/17. Admitted 7/13 PM for Acute respiratory failure hypoxia - probably secondary  to pulmonary edema. Possible pneumonia also. D-dimer 4/23.    Pharmacy dosing IV heparin for PE.  VQ scan. She is s/p recent left knee arthroplasty 05/16/17, she was taking ASA 325 mg BID x 1 month post op for VTE prophylaxis.  06/10/17 pt seen at MD office visit for LE swelling. 6/27 Left venous duplex done : no evidence of DVT Hgb 9.2, plts wnl.  No bleeding noted.  6 hour Heparin level = 0.62 on IV heparin 1250 units/hr and after 4500 units bolus given. Therapeutic heparin level.  No bleeding reported.    Goal of Therapy:  Heparin level 0.3-0.7 units/ml Monitor platelets by anticoagulation protocol: Yes   Plan:  Continue IV heparin gtt at 1250 units/hr Check HL in 6 hrs to confirm remains therapeutic Monitor daily heparin level, CBC, s/s of bleed F/u VQ scan results   Nicole Cella, RPh Clinical Pharmacist Pager: [phone] [place] [phone] 06/28/2017,11:54 AM

## 2017-06-28 NOTE — Consult Note (Signed)
PULMONARY / CRITICAL CARE MEDICINE   Name: Sarah [name] MRN: [id number] DOB: [birth year]    ADMISSION DATE:  06/27/2017 CONSULTATION DATE: 06/28/17  REFERRING MD:  Dr Hal Hope  CHIEF COMPLAINT: SOB  HISTORY OF PRESENT ILLNESS:   65yoF with history of Diastolic CHF, CVA, HTN, Gout, DM, CKD, OA, and is s/p left knee arthroplasty 05/16/17 after which she went to rehab for 2 weeks. She now presents to the ER c/o SOB x several days that worsened greatly today. She c/o cough productive of frothy yellow/white sputum and also says she have had N/V several times over the past day which also looks like frothy white/yellow emesis. She admits to CP earlier (now resolved), some wheezing, and SOB. In the ER she was initially in respiratory distress and was placed on BIPAP (started at 12:40am). CXR showed multifocal bilateral infiltrates, and it was unclear if they represented pulmonary edema vs pneumonia. She was therefore started on antibiotics and given lasix.   At the time of my exam, she is comfortable on BIPAP in no respiratory distress and is speaking in full sentences. She admits to increasing LE edema over the past several days but doesn't know if she has gained weight as she doesn't weigh herself. She denies F/C or known sick contacts.   PAST MEDICAL HISTORY :  She  has a past medical history of Allergy; Anxiety; Arthritis; Cataract; Chronic kidney disease; Diabetes mellitus; Gout; Heart murmur; Hyperlipidemia; Hypertension; Peripheral neuropathy; PONV (postoperative nausea and vomiting); and Stroke ([place]).  PAST SURGICAL HISTORY: She  has a past surgical history that includes Tubal ligation; Hemorrhoid surgery; Neck surgery; Cataract extraction, bilateral; Appendectomy; Eye surgery; Fracture surgery; Spine surgery; and Total knee arthroplasty (Left, 05/16/2017).  Allergies  Allergen Reactions  . Metoprolol Swelling    SWELLING REACTION UNSPECIFIED   . Cefaclor Hives  . Tetanus Toxoid Adsorbed  Swelling    SWELLING REACTION UNSPECIFIED     No current facility-administered medications on file prior to encounter.    Current Outpatient Prescriptions on File Prior to Encounter  Medication Sig  . allopurinol (ZYLOPRIM) 300 MG tablet Take 1 tablet (300 mg total) by mouth daily. (Patient taking differently: Take 300 mg by mouth daily as needed. )  . amLODipine (NORVASC) 10 MG tablet Take 1 tablet (10 mg total) by mouth daily.  Marland Kitchen aspirin EC 325 MG tablet Take 1 tablet (325 mg total) by mouth 2 (two) times daily after a meal. Take x 1 month post op to decrease risk of blood clots.  Marland Kitchen atorvastatin (LIPITOR) 80 MG tablet TAKE 1 TABLET (80 MG TOTAL) BY MOUTH DAILY.  Marland Kitchen COLCRYS 0.6 MG tablet Take 1 tablet (0.6 mg total) by mouth daily. (Patient taking differently: Take 0.6 mg by mouth daily as needed (gout flares). )  . cyclobenzaprine (FLEXERIL) 5 MG tablet Take 1 tablet (5 mg total) by mouth 3 (three) times daily as needed for muscle spasms.  . furosemide (LASIX) 20 MG tablet Take 1 tablet (20 mg total) by mouth daily.  Marland Kitchen HYDROcodone-acetaminophen (NORCO) 10-325 MG tablet Take 1 tablet by mouth every 8 (eight) hours as needed. (Patient taking differently: Take 1 tablet by mouth every 8 (eight) hours as needed for moderate pain. )  . insulin aspart (NOVOLOG FLEXPEN) 100 UNIT/ML FlexPen 20 units prior to each meal  . insulin glargine (LANTUS) 100 UNIT/ML injection 10 units at bedtime  . ACCU-CHEK FASTCLIX LANCETS MISC USE TO CHECK BLOOD SUGAR THREE TIMES A DAY BEFORE MEALS AND AT BEDTIME (  Patient not taking: Reported on 06/27/2017)  . ACCU-CHEK SMARTVIEW test strip USE TO CHECK BLOOD SUGAR THREE TIMES A DAY BEFORE MEALS AND AT BEDTIME (Patient not taking: Reported on 06/27/2017)  . Blood Glucose Monitoring Suppl (ACCU-CHEK NANO SMARTVIEW) w/Device KIT USE AS DIRECTED (Patient not taking: Reported on 06/27/2017)  . Insulin Pen Needle (NOVOFINE) 30G X 8 MM MISC Inject 10 each into the skin as needed.  (Patient not taking: Reported on 06/27/2017)  . LORazepam (ATIVAN) 0.5 MG tablet Take 1 tablet (0.5 mg total) by mouth 2 (two) times daily as needed for anxiety. (Patient not taking: Reported on 06/27/2017)  . potassium bicarbonate (KLOR-CON/EF) 25 MEQ disintegrating tablet Take 25 mEq by mouth daily. In 6 oz of water   FAMILY HISTORY:  Her indicated that her mother is deceased. She indicated that her father is deceased. She indicated that both of her sisters are alive. She indicated that both of her brothers are alive.   SOCIAL HISTORY: She  reports that she has never smoked. She has never used smokeless tobacco. She reports that she does not drink alcohol or use drugs.  REVIEW OF SYSTEMS:   Review of Systems  Constitutional: Negative.   HENT: Negative.   Eyes: Negative.   Respiratory: Positive for cough, sputum production, shortness of breath and wheezing.   Cardiovascular: Positive for chest pain and leg swelling.  Gastrointestinal: Positive for nausea and vomiting.  Genitourinary: Negative.   Musculoskeletal: Negative.   Skin: Negative.   Neurological: Negative.   Endo/Heme/Allergies: Negative.   Psychiatric/Behavioral: Negative.    VITAL SIGNS: BP 137/64   Pulse 78   Temp 97.7 F (36.5 C) (Oral)   Resp (!) 21   Ht '5\' 7"'  (1.702 m)   Wt 80.7 kg (178 lb)   SpO2 100%   BMI 27.88 kg/m   VENTILATOR SETTINGS: FiO2 (%):  [60 %] 60 %  INTAKE / OUTPUT: No intake/output data recorded.  PHYSICAL EXAMINATION: General: WDWN Elderly female, lying on ER stretcher on BIPAP, in NAD Neuro: AAOx3, moving all extremities HEENT: MM moist, OP clear, PERRL, EOMI Cardiovascular: RRR, no m/r/g Lungs: Coarse breath sounds b/l, no wheezing. No accessory muscle use while on BIPAP. Speaking in full sentences.  Abdomen: soft NTND, BS+ Musculoskeletal:  2-3+ pitting BLE edema; Left knee arthroplasty incision well healed, but left knee is slightly more warm than right knee; no noticeable  erythema Skin: no rashes   LABS:  BMET  Recent Labs Lab 06/27/17 2240 06/28/17 0230  NA 136  --   K 4.9  --   CL 102  --   CO2 21*  --   BUN 25*  --   CREATININE 2.35* 2.56*  GLUCOSE 226*  --    Electrolytes  Recent Labs Lab 06/27/17 2240  CALCIUM 8.7*   CBC  Recent Labs Lab 06/27/17 2240 06/28/17 0230 06/28/17 0408  WBC 10.7* 11.7* 11.4*  HGB 10.1* 9.4* 9.2*  HCT 30.5* 29.1* 28.7*  PLT 451* 364 399   Coag's No results for input(s): APTT, INR in the last 168 hours.  Sepsis Markers  Recent Labs Lab 06/27/17 2251 06/28/17 0230  LATICACIDVEN 2.23* 5.4*   ABG  Recent Labs Lab 06/27/17 2306  PHART 7.473*  PCO2ART 29.2*  PO2ART 47.0*   Liver Enzymes  Recent Labs Lab 06/27/17 2240  AST 40  ALT 16  ALKPHOS 93  BILITOT 1.4*  ALBUMIN 3.5   Cardiac Enzymes  Recent Labs Lab 06/28/17 0230  TROPONINI 0.27*  Glucose  Recent Labs Lab 06/28/17 0343  GLUCAP 248*   Imaging Dg Chest 2 View  Result Date: 06/27/2017 CLINICAL DATA:  66 y/o  F; cough, shortness of breath, nausea. EXAM: CHEST  2 VIEW COMPARISON:  05/09/2017 chest radiograph FINDINGS: Stable normal cardiac silhouette. Patchy diffuse pulmonary opacities greatest in mid and upper lung zones may represent alveolar pulmonary edema or multifocal pneumonia. No pleural effusion or pneumothorax. Anterior cervical fusion hardware noted. IMPRESSION: Patchy opacities in mid and upper lung zones may represent developing alveolar pulmonary edema or multifocal pneumonia. Electronically Signed   By: Kristine Garbe M.D.   On: 06/27/2017 21:06   ANTIBIOTICS: S/p 1 dose Vancomycin and 1 dose Levofloxacin in ER Aztreonam is ordered but not charted as being given yet  LINES/TUBES: 20G and 22G PIV's  ASSESSMENT / PLAN: 65yoF with history of Diastolic CHF, CVA, HTN, Gout, DM, CKD, OA, and is s/p left knee arthroplasty 05/16/17, now admitted with worsening SOB and Productive cough over several  days, as well as N/V (several times over 1 day), and increasing LE edema. Found to have Acute hypoxic respiratory failure for which she was placed on BIPAP.   PULMONARY 1. Acute Hypoxic Respiratory failure; Multifocal pulmonary infiltrates (CHF exacerbation vs HCAP): - CXR on my review shows multifocal bilateral pulmonary infiltrates that could be represensentative of pulmonary edema vs pneumonia - Clinically patient is afebrile and is clearly fluid overloaded on exam, which would be in line with a CHF exacerbation. Her elevated BNP also fits with this. - However, she is also s/p recent surgery and rehab stay, making her higher risk for HCAP. And her recent N/V puts her higher risk for aspiration pna, although per her history it sounds as if the SOB and Cough started before the N/V. Other concerning factors are that her lactate worsened instead of improved after being on the BIPAP for 2 hours.  - Agree with pan-culturing and starting broad spectrum antibiotics to cover for possible HCAP.  - Agree with attempting to diurese (has thus far received only 67m Lasix in ER with UOP of 100cc); will give another 696mLasix IV now and asked RN to place foley catheter - Obtain TTE today - She is very comfortable on the BIPAP at this time, and her BIPAP settings are relatively low (60%FIO2, IPAP 10, EPAP 5, Pox 100%) and could be weaned even further. Therefore, I feel she is stable for step-down unit at this time. If her condition worsens, we are happy to re-eval and transfer to ICU.    CARDIOVASCULAR 1. Hx Diastolic CHF: - diurese as stated above; obtain TTE  2. NSTEMI: likely type 2 from demand ischemia - Trop 0.27, up from 0.15 on admission; will continue to trend it. No CP currently - EKG nonischemic - patient started on heparin gtt per primary team  3. History HTN: - hold norvasc for now as BP may drop with aggressive diuresis; re-eval later in day to restart it or not.   RENAL 1. CKD stage IV -  creatinine 2.56, appears to be at her baseline which fluctuates between 2-3 - place foley; monitor UOP closely  GASTROINTESTINAL No active issues   HEMATOLOGIC 1. Anemia: - Hgb 9.4 which appears to be at her baseline of 9-10; likely anemia is related to her CKD  INFECTIOUS 1. Sepsis; Possible HCAP: - CXR with multifocal pulmonary infiltrates as discussed above - Also patient's knee is a little warm but not red or tender - Obtain blood cultures, sputum  culture, UA - trend lactate and procalcitonin - agree with Vanc, Aztreonam, and Levoflox  ENDOCRINE 1. DM:  - NPO while in BIPAP - SSI  NEUROLOGIC No active issues  MUSCULOSKELETAL 1. Left knee arthroplasty: - left knee joint is mildly warm; question if could be infected. Patient says he surgeon told her it was expected that it would be warm now 6 weeks out.  - blood cultures and antibiotics as above  2. Chronic back pain - place lidocaine patch   60 minutes critical care time  Vernie Murders, MD Pulmonary and [place] Pager: [phone]  06/28/2017, 5:28 AM

## 2017-06-28 NOTE — ED Provider Notes (Addendum)
Medical screening examination/treatment/procedure(s) were conducted as a shared visit with non-physician practitioner(s) and myself.  I personally evaluated the patient during the encounter.   EKG Interpretation  Date/Time:  Friday June 27 2017 21:55:33 EDT Ventricular Rate:  85 PR Interval:    QRS Duration: 96 QT Interval:  389 QTC Calculation: 463 R Axis:   51 Text Interpretation:  Sinus rhythm Ventricular premature complex Nonspecific ST depression Baseline wander in lead(s) V6 No significant change since last tracing Confirmed by Linwood DibblesKnapp, Lorrayne Ismael [phone]) on 06/27/2017 11:55:37 PM      Patient presented to the emergency room with complaints of productive cough. She's also had some posttussive emesis. Patient is also reporting dyspnea on exertion.  Patient had an outpatient chest x-ray that shows severe bilateral infiltrates. Possible pneumonia versus congestive heart failure. On My exam the patient does have pedal edema.  PE was initially a concern, considering her recent surgery however chest x-ray is clearly abnormal and I think would account for her shortness of breath. She cannot get a CT scan because of her creatinine. A VQ scan would likely be indeterminate because of the significantly abnormal chest x-ray.  Plan on admitting the patient to the hospital for treatment of CHF exacerbation, less likely pneumonia. She'll need inpatient treatment for further evaluation.   Linwood DibblesKnapp, Lateef Juncaj, MD 06/28/17 0008  Dragon error corrected   Linwood DibblesKnapp, Ridwan Bondy, MD 06/30/17 [phone]

## 2017-06-28 NOTE — Progress Notes (Signed)
ANTICOAGULATION CONSULT NOTE - Follow Up Consult  Pharmacy Consult for Heparin Indication: pulmonary embolus  Allergies  Allergen Reactions  . Metoprolol Swelling    SWELLING REACTION UNSPECIFIED   . Cefaclor Hives  . Tetanus Toxoid Adsorbed Swelling    SWELLING REACTION UNSPECIFIED     Patient Measurements: Height: '5\' 7"'  (170.2 cm) Weight: 178 lb (80.7 kg) IBW/kg (Calculated) : 61.6 Heparin Dosing Weight: 78.1 kg  Vital Signs: Temp: 98.7 F (37.1 C) (07/14 1800) Temp Source: Oral (07/14 1800) BP: 128/91 (07/14 1700) Pulse Rate: 89 (07/14 1917)  Labs:  Recent Labs  06/27/17 2240  06/28/17 0230 06/28/17 0408 06/28/17 1023 06/28/17 1804 06/28/17 1830  HGB 10.1*  --  9.4* 9.2*  --   --   --   HCT 30.5*  --  29.1* 28.7*  --   --   --   PLT 451*  --  364 399  --   --   --   HEPARINUNFRC  --   --   --   --  0.62 0.44  --   CREATININE 2.35*  --  2.56* 2.44*  --   --   --   TROPONINI  --   < > 0.27* 0.37* 0.90*  --  1.50*  < > = values in this interval not displayed.  Estimated Creatinine Clearance: 24.8 mL/min (A) (by C-G formula based on SCr of 2.44 mg/dL (H)).   Medications:  Prescriptions Prior to Admission  Medication Sig Dispense Refill Last Dose  . allopurinol (ZYLOPRIM) 300 MG tablet Take 1 tablet (300 mg total) by mouth daily. (Patient taking differently: Take 300 mg by mouth daily as needed. ) 90 tablet 3 prn  . amLODipine (NORVASC) 10 MG tablet Take 1 tablet (10 mg total) by mouth daily. 90 tablet 3 06/27/2017 at Unknown time  . aspirin EC 325 MG tablet Take 1 tablet (325 mg total) by mouth 2 (two) times daily after a meal. Take x 1 month post op to decrease risk of blood clots. 60 tablet 0 06/27/2017 at Unknown time  . atorvastatin (LIPITOR) 80 MG tablet TAKE 1 TABLET (80 MG TOTAL) BY MOUTH DAILY. 90 tablet 3 06/26/2017 at Unknown time  . COLCRYS 0.6 MG tablet Take 1 tablet (0.6 mg total) by mouth daily. (Patient taking differently: Take 0.6 mg by mouth daily  as needed (gout flares). ) 10 tablet 5 prn  . cyclobenzaprine (FLEXERIL) 5 MG tablet Take 1 tablet (5 mg total) by mouth 3 (three) times daily as needed for muscle spasms. 30 tablet 0 prn  . furosemide (LASIX) 20 MG tablet Take 1 tablet (20 mg total) by mouth daily. 30 tablet 1 06/26/2017 at Unknown time  . HYDROcodone-acetaminophen (NORCO) 10-325 MG tablet Take 1 tablet by mouth every 8 (eight) hours as needed. (Patient taking differently: Take 1 tablet by mouth every 8 (eight) hours as needed for moderate pain. ) 30 tablet 0 Past Week at Unknown time  . insulin aspart (NOVOLOG FLEXPEN) 100 UNIT/ML FlexPen 20 units prior to each meal 15 pen 1 06/27/2017 at Unknown time  . insulin glargine (LANTUS) 100 UNIT/ML injection 10 units at bedtime 10 mL 6 06/26/2017 at Unknown time  . lidocaine (LIDODERM) 5 % Place 1 patch onto the skin daily. Remove & Discard patch within 12 hours or as directed by MD   Past Week at Unknown time  . ACCU-CHEK FASTCLIX LANCETS MISC USE TO CHECK BLOOD SUGAR THREE TIMES A DAY BEFORE MEALS AND AT BEDTIME (  Patient not taking: Reported on 06/27/2017) 306 each 0 Not Taking at Unknown time  . ACCU-CHEK SMARTVIEW test strip USE TO CHECK BLOOD SUGAR THREE TIMES A DAY BEFORE MEALS AND AT BEDTIME (Patient not taking: Reported on 06/27/2017) 400 each 3 Not Taking at Unknown time  . Blood Glucose Monitoring Suppl (ACCU-CHEK NANO SMARTVIEW) w/Device KIT USE AS DIRECTED (Patient not taking: Reported on 06/27/2017) 1 kit 0 Not Taking at Unknown time  . Insulin Pen Needle (NOVOFINE) 30G X 8 MM MISC Inject 10 each into the skin as needed. (Patient not taking: Reported on 06/27/2017) 90 each 3 Not Taking at Unknown time  . LORazepam (ATIVAN) 0.5 MG tablet Take 1 tablet (0.5 mg total) by mouth 2 (two) times daily as needed for anxiety. (Patient not taking: Reported on 06/27/2017) 180 tablet 1 Not Taking at Unknown time  . potassium bicarbonate (KLOR-CON/EF) 25 MEQ disintegrating tablet Take 25 mEq by mouth  daily. In 6 oz of water   Not Taking at Unknown time    Assessment: 66 yo F presents on 7/13 with SOB x several days that worsened greatly on 06/27/17. Admitted 7/13 PM for Acute respiratory failure hypoxia - probably secondary to pulmonary edema. Possible pneumonia also. D-dimer 4/23.    Pharmacy dosing IV heparin for PE.  VQ scan. She is s/p recent left knee arthroplasty 05/16/17, she was taking ASA 325 mg BID x 1 month post op for VTE prophylaxis.  06/10/17 pt seen at MD office visit for LE swelling. 6/27 Left venous duplex done : no evidence of DVT Hgb 9.2, plts wnl.  No bleeding noted.   2nd  Heparin level = 0.44 on IV heparin 1250 units/hr.  Therapeutic heparin level.  No bleeding reported.    Goal of Therapy:  Heparin level 0.3-0.7 units/ml Monitor platelets by anticoagulation protocol: Yes   Plan:  Continue IV heparin gtt at 1250 units/hr Monitor daily heparin level, CBC, s/s of bleed F/u VQ scan results   Nicole Cella, RPh Clinical Pharmacist Pager: [phone] [place] [phone] 06/28/2017,7:34 PM

## 2017-06-28 NOTE — ED Notes (Signed)
Spoke with Dr. Toniann FailKakrakandy about troponin elevation and that pt reported back pain.

## 2017-06-28 NOTE — Progress Notes (Signed)
ANTICOAGULATION CONSULT NOTE - Initial Consult  Pharmacy Consult for heparin Indication: pulmonary embolus  Allergies  Allergen Reactions  . Metoprolol Swelling    SWELLING REACTION UNSPECIFIED   . Cefaclor Hives  . Tetanus Toxoid Adsorbed Swelling    SWELLING REACTION UNSPECIFIED     Patient Measurements: Height: 5\' 7"  (170.2 cm) Weight: 178 lb (80.7 kg) IBW/kg (Calculated) : 61.6 Heparin Dosing Weight: 78.1 kg  Assessment: 66 yo F presents on 7/13 with SOB. Pharmacy consulted to start heparin. Hgb 9.4, plts wnl.  Goal of Therapy:  Heparin level 0.3-0.7 units/ml Monitor platelets by anticoagulation protocol: Yes   Plan:  Give heparin 4,500 unit bolus Start heparin gtt at 1,250 units/hr Monitor daily heparin level, CBC, s/s of bleed  Sarah [name], PharmD, [facility]BC[facility] Clinical Pharmacist Pager [phone] 06/28/2017 3:45 AM

## 2017-06-28 NOTE — Progress Notes (Signed)
Patient seen and examined this morning, admitted overnight by Dr. Gilford SilviusKakrakandi, H&P reviewed and agree with the assessment and plan  In brief, this is a 66 year old female with type 2 diabetes mellitus, chronic kidney disease stage IV, anemia, hypertension, hyperlipidemia, who came to the hospital due to increasing shortness of breath over the last 3 days.  She also had a knee replacement about 6 weeks ago.   Acute hypoxic respiratory failure -Severe, requiring BiPAP.  Critical care consulted, recommended stepdown admission -Chest x-ray on admission patchy opacities in the mid and upper lung zones suggesting multifocal pneumonia versus developing edema.  Agree with broad-spectrum antibiotics as well as diuresis.  2D echo is pending. -PE is also hard of differential, given elevated d-dimer she was started on empiric heparin infusion and a VQ scan is pending  CHF -?  BNP was elevated at 370, 2D echo is pending -Continue diuresis, blood pressure is stable -She does appear fluid overloaded with lower extremity edema as well as crackles on exam -Continue to cycle cardiac enzymes, one slightly elevated.  No chest pain  Multifocal pneumonia -Continue broad-spectrum antibiotics with vancomycin and aztreonam  Status post left knee replacement -Surgical site looks good, no erythema, no tenderness, minimal pain  Type 2 diabetes mellitus -Keep n.p.o. due to BiPAP, sliding scale  Hypertension -IV hydralazine as needed  Chronic kidney disease stage IV -Creatinine between 2 and 3 at baseline, currently at baseline, monitor with diuresis  Anemia of chronic kidney disease -Hemoglobin appears at baseline   Costin M. Elvera LennoxGherghe, MD [facility] [phone]  If 7PM-7AM, please contact night-coverage [email or web address] Password [email or web address]

## 2017-06-28 NOTE — Progress Notes (Signed)
Pt placed on BiPAP at this time.

## 2017-06-28 NOTE — H&P (Addendum)
History and Physical    Sarah [name] XBW:[id number] DOB: [birth year] DOA: 06/27/2017  PCP: Marletta Lor, MD  Patient coming from: Home.  Chief Complaint: Shortness of breath.  HPI: Sarah [name] is a 66 y.o. female with history of diabetes mellitus type 2, chronic kidney disease stage IV, anemia, hypertension, hyperlipidemia who had recent left knee replacement 6 weeks ago presents to the ER with complaints of increasing shortness of breath over the last 3 days. Patient also had 2 episodes of vomiting. Denies abdominal pain or diarrhea. Denies any chest pain fever chills. Has been having some productive cough.  ED Course: In the ER patient is found to be hypoxic requiring 15 L oxygen to maintain sats. Chest x-ray shows congestion. Patient was started on Lasix 40 mg IV and antibiotics for possible pneumonia. Patient is being placed on CPAP.  Review of Systems: As per HPI, rest all negative.   Past Medical History:  Diagnosis Date  . Allergy   . Anxiety    history   . Arthritis   . Cataract    history  . Chronic kidney disease   . Diabetes mellitus   . Gout   . Heart murmur   . Hyperlipidemia   . Hypertension   . Peripheral neuropathy   . PONV (postoperative nausea and vomiting)   . Stroke [facility])     Past Surgical History:  Procedure Laterality Date  . APPENDECTOMY    . CATARACT EXTRACTION, BILATERAL    . EYE SURGERY    . FRACTURE SURGERY     L foot  . HEMORRHOID SURGERY    . NECK SURGERY    . SPINE SURGERY     cervical spine  . TOTAL KNEE ARTHROPLASTY Left 05/16/2017   Procedure: TOTAL KNEE ARTHROPLASTY;  Surgeon: Dorna Leitz, MD;  Location: [facility];  Service: Orthopedics;  Laterality: Left;  . TUBAL LIGATION       reports that she has never smoked. She has never used smokeless tobacco. She reports that she does not drink alcohol or use drugs.  Allergies  Allergen Reactions  . Metoprolol Swelling    SWELLING REACTION UNSPECIFIED   . Cefaclor Hives  .  Tetanus Toxoid Adsorbed Swelling    SWELLING REACTION UNSPECIFIED     Family History  Problem Relation Age of Onset  . Cancer Mother   . Hypertension Mother   . Diabetes Mother   . Heart disease Father     Prior to Admission medications   Medication Sig Start Date End Date Taking? Authorizing Provider  allopurinol (ZYLOPRIM) 300 MG tablet Take 1 tablet (300 mg total) by mouth daily. Patient taking differently: Take 300 mg by mouth daily as needed.  12/23/16  Yes Marletta Lor, MD  amLODipine (NORVASC) 10 MG tablet Take 1 tablet (10 mg total) by mouth daily. 12/23/16  Yes Marletta Lor, MD  aspirin EC 325 MG tablet Take 1 tablet (325 mg total) by mouth 2 (two) times daily after a meal. Take x 1 month post op to decrease risk of blood clots. 05/16/17  Yes Gary Fleet, PA-C  atorvastatin (LIPITOR) 80 MG tablet TAKE 1 TABLET (80 MG TOTAL) BY MOUTH DAILY. 12/23/16  Yes Marletta Lor, MD  COLCRYS 0.6 MG tablet Take 1 tablet (0.6 mg total) by mouth daily. Patient taking differently: Take 0.6 mg by mouth daily as needed (gout flares).  12/23/16  Yes Marletta Lor, MD  cyclobenzaprine (FLEXERIL) 5 MG tablet Take 1  tablet (5 mg total) by mouth 3 (three) times daily as needed for muscle spasms. 06/20/17  Yes Marletta Lor, MD  furosemide (LASIX) 20 MG tablet Take 1 tablet (20 mg total) by mouth daily. 02/18/17  Yes Marletta Lor, MD  HYDROcodone-acetaminophen [facility]) 10-325 MG tablet Take 1 tablet by mouth every 8 (eight) hours as needed. Patient taking differently: Take 1 tablet by mouth every 8 (eight) hours as needed for moderate pain.  06/24/17  Yes Marletta Lor, MD  insulin aspart (NOVOLOG FLEXPEN) 100 UNIT/ML FlexPen 20 units prior to each meal 12/31/16  Yes Marletta Lor, MD  insulin glargine (LANTUS) 100 UNIT/ML injection 10 units at bedtime 06/24/17  Yes Marletta Lor, MD  lidocaine (LIDODERM) 5 % Place 1 patch onto the skin daily. Remove &  Discard patch within 12 hours or as directed by MD   Yes [provider]  ACCU-CHEK FASTCLIX LANCETS MISC USE TO [place] A DAY BEFORE MEALS AND AT BEDTIME Patient not taking: Reported on 06/27/2017 02/04/17   Marletta Lor, MD  ACCU-CHEK SMARTVIEW test strip USE TO CHECK BLOOD SUGAR THREE TIMES A DAY BEFORE MEALS AND AT BEDTIME Patient not taking: Reported on 06/27/2017 02/04/17   Marletta Lor, MD  Blood Glucose Monitoring Suppl (ACCU-CHEK NANO SMARTVIEW) w/Device KIT USE AS DIRECTED Patient not taking: Reported on 06/27/2017 12/23/16   Marletta Lor, MD  Insulin Pen Needle (NOVOFINE) 30G X 8 MM MISC Inject 10 each into the skin as needed. Patient not taking: Reported on 06/27/2017 12/23/16   Marletta Lor, MD  LORazepam (ATIVAN) 0.5 MG tablet Take 1 tablet (0.5 mg total) by mouth 2 (two) times daily as needed for anxiety. Patient not taking: Reported on 06/27/2017 12/23/16   Marletta Lor, MD  potassium bicarbonate (KLOR-CON/EF) 25 MEQ disintegrating tablet Take 25 mEq by mouth daily. In 6 oz of water    [provider]    Physical Exam: Vitals:   06/27/17 2312 06/27/17 2330 06/28/17 0000 06/28/17 0015  BP:  (!) 142/77 (!) 150/72 (!) 154/79  Pulse: 93 89 94 94  Resp: (!) 25 (!) 24 (!) 23 18  Temp:      TempSrc:      SpO2: 90% 98% 95% 97%  Weight:      Height:          Constitutional: Moderately built and nourished. Vitals:   06/27/17 2312 06/27/17 2330 06/28/17 0000 06/28/17 0015  BP:  (!) 142/77 (!) 150/72 (!) 154/79  Pulse: 93 89 94 94  Resp: (!) 25 (!) 24 (!) 23 18  Temp:      TempSrc:      SpO2: 90% 98% 95% 97%  Weight:      Height:       Eyes: Anicteric no pallor. ENMT: No discharge from the ears eyes nose or mouth. Neck: No mass felt. JVD elevated. Respiratory: No rhonchi or crepitations. Cardiovascular: S1-S2 heard no murmurs appreciated. Abdomen: Soft nontender also present. No guarding or  rigidity. Musculoskeletal: No edema. No joint effusion. Skin: No rash or skin appears warm. Neurologic: Alert awake oriented to time place and person. Moves all extremities. Psychiatric: Appears normal. Normal affect.   Labs on Admission: I have personally reviewed following labs and imaging studies  CBC:  Recent Labs Lab 06/27/17 2240  WBC 10.7*  NEUTROABS 8.4*  HGB 10.1*  HCT 30.5*  MCV 77.0*  PLT 076*   Basic Metabolic Panel:  Recent Labs Lab 06/27/17 2240  NA 136  K 4.9  CL 102  CO2 21*  GLUCOSE 226*  BUN 25*  CREATININE 2.35*  CALCIUM 8.7*   GFR: Estimated Creatinine Clearance: 25.7 mL/min (A) (by C-G formula based on SCr of 2.35 mg/dL (H)). Liver Function Tests:  Recent Labs Lab 06/27/17 2240  AST 40  ALT 16  ALKPHOS 93  BILITOT 1.4*  PROT 7.3  ALBUMIN 3.5   No results for input(s): LIPASE, AMYLASE in the last 168 hours. No results for input(s): AMMONIA in the last 168 hours. Coagulation Profile: No results for input(s): INR, PROTIME in the last 168 hours. Cardiac Enzymes: No results for input(s): CKTOTAL, CKMB, CKMBINDEX, TROPONINI in the last 168 hours. BNP (last 3 results) No results for input(s): PROBNP in the last 8760 hours. HbA1C: No results for input(s): HGBA1C in the last 72 hours. CBG: No results for input(s): GLUCAP in the last 168 hours. Lipid Profile: No results for input(s): CHOL, HDL, LDLCALC, TRIG, CHOLHDL, LDLDIRECT in the last 72 hours. Thyroid Function Tests: No results for input(s): TSH, T4TOTAL, FREET4, T3FREE, THYROIDAB in the last 72 hours. Anemia Panel: No results for input(s): VITAMINB12, FOLATE, FERRITIN, TIBC, IRON, RETICCTPCT in the last 72 hours. Urine analysis:    Component Value Date/Time   COLORURINE YELLOW 05/09/2017 [place] 05/09/2017 1049   LABSPEC 1.014 05/09/2017 1049   PHURINE 6.0 05/09/2017 1049   GLUCOSEU 150 (A) 05/09/2017 1049   HGBUR NEGATIVE 05/09/2017 [place] 05/09/2017 1049   KETONESUR NEGATIVE 05/09/2017 1049   PROTEINUR 100 (A) 05/09/2017 1049   NITRITE NEGATIVE 05/09/2017 1049   LEUKOCYTESUR NEGATIVE 05/09/2017 1049   Sepsis Labs: '@LABRCNTIP' (procalcitonin:4,lacticidven:4) )No results found for this or any previous visit (from the past 240 hour(s)).   Radiological Exams on Admission: Dg Chest 2 View  Result Date: 06/27/2017 CLINICAL DATA:  66 y/o  F; cough, shortness of breath, nausea. EXAM: CHEST  2 VIEW COMPARISON:  05/09/2017 chest radiograph FINDINGS: Stable normal cardiac silhouette. Patchy diffuse pulmonary opacities greatest in mid and upper lung zones may represent alveolar pulmonary edema or multifocal pneumonia. No pleural effusion or pneumothorax. Anterior cervical fusion hardware noted. IMPRESSION: Patchy opacities in mid and upper lung zones may represent developing alveolar pulmonary edema or multifocal pneumonia. Electronically Signed   By: Kristine Garbe M.D.   On: 06/27/2017 21:06    EKG: Independently reviewed. Normal sinus rhythm.  Assessment/Plan Principal Problem:   Acute respiratory failure (HCC) Active Problems:   Diabetes mellitus with renal complications (HCC)   Essential hypertension   Acute on chronic diastolic CHF (congestive heart failure) ([place])   HCAP (healthcare-associated pneumonia)   Acute respiratory failure with hypoxia ([place])    1. Acute respiratory failure hypoxia - probably secondary to pulmonary edema. Possible pneumonia also. Patient is placed on Lasix 40 mg IV every 12. I have given additional Lasix of 40. Closely follow intake output metabolic panel daily weights and continue CPAP. I have consulted pulmonary critical care. For possible pneumonia patient is on antibiotics. Check pro calcitonin levels. If negative may discontinue antibiotics. Patient's last 2-D echo done in June 2016 showing EF of 60-65% with grade 1 diastolic dysfunction. Check d-dimer and if elevated will keep  patient on heparin infusion and get VQ scan. 2. Nausea vomiting and back pain - will get CT abdomen and pelvis. 3. Diabetes mellitus type 2 - for now patient is nothing by mouth in anticipation of possible need for  intubation. Patient is on sliding scale coverage. 4. Hypertension - when necessary IV hydralazine for now. 5. History of gout. 6. Chronic kidney disease stage IV - closely follow intake output and metabolic panel. 7. Anemia probably secondary to renal disease appears to be chronic - follow CBC. 8. Recent left knee surgery.   DVT prophylaxis: Heparin. Code Status: Full code.  Family Communication: Discussed with patient.  Disposition Plan: To be determined.  Consults called: Pulmonary critical care.  Admission status: Inpatient.    Rise Patience MD [facility] Pager [phone].  If 7PM-7AM, please contact night-coverage [email or web address] Password [email or web address]  06/28/2017, 1:11 AM

## 2017-06-28 NOTE — Progress Notes (Signed)
CRITICAL VALUE ALERT  Critical Value:  Lactic Acid 3.1  Date & Time Notied:  06/28/2017 2240  Provider Notified: K. [place], [place]  Orders Received/Actions taken:    Denean Pavon A Chatara Lucente

## 2017-06-28 NOTE — Progress Notes (Signed)
Pharmacy Antibiotic Note  Sarah [name] is a 66 y.o. female admitted on 06/27/2017 with pneumonia.  Pharmacy has been consulted for vancomycin dosing.  Plan: Vancomycin 1000mg  IV every 24 hours.  Goal trough 15-20 mcg/mL.  Will change aztreonam to 1g IV every 8 hours.  Height: 5\' 7"  (170.2 cm) Weight: 178 lb (80.7 kg) IBW/kg (Calculated) : 61.6  Temp (24hrs), Avg:98.2 F (36.8 C), Min:97.7 F (36.5 C), Max:98.6 F (37 C)   Recent Labs Lab 06/27/17 2240 06/27/17 2251  WBC 10.7*  --   CREATININE 2.35*  --   LATICACIDVEN  --  2.23*    Estimated Creatinine Clearance: 25.7 mL/min (A) (by C-G formula based on SCr of 2.35 mg/dL (H)).    Allergies  Allergen Reactions  . Metoprolol Swelling    SWELLING REACTION UNSPECIFIED   . Cefaclor Hives  . Tetanus Toxoid Adsorbed Swelling    SWELLING REACTION UNSPECIFIED      Thank you for allowing pharmacy to be a part of this patient's care.  Sarah [name], PharmD, BCPS  06/28/2017 1:16 AM

## 2017-06-29 ENCOUNTER — Encounter (HOSPITAL_COMMUNITY): Payer: Self-pay | Admitting: Physician Assistant

## 2017-06-29 DIAGNOSIS — J9601 Acute respiratory failure with hypoxia: Secondary | ICD-10-CM

## 2017-06-29 DIAGNOSIS — J810 Acute pulmonary edema: Secondary | ICD-10-CM

## 2017-06-29 DIAGNOSIS — J9600 Acute respiratory failure, unspecified whether with hypoxia or hypercapnia: Secondary | ICD-10-CM

## 2017-06-29 LAB — GLUCOSE, CAPILLARY
GLUCOSE-CAPILLARY: 168 mg/dL — AB (ref 65–99)
GLUCOSE-CAPILLARY: 146 mg/dL — AB (ref 65–99)
GLUCOSE-CAPILLARY: 268 mg/dL — AB (ref 65–99)
GLUCOSE-CAPILLARY: 212 mg/dL — AB (ref 65–99)
Glucose-Capillary: 140 mg/dL — ABNORMAL HIGH (ref 65–99)
Glucose-Capillary: 166 mg/dL — ABNORMAL HIGH (ref 65–99)
Glucose-Capillary: 207 mg/dL — ABNORMAL HIGH (ref 65–99)

## 2017-06-29 LAB — CBC
HEMATOCRIT: 24.8 % — AB (ref 36.0–46.0)
HEMOGLOBIN: 8.2 g/dL — AB (ref 12.0–15.0)
MCH: 25.3 pg — AB (ref 26.0–34.0)
MCHC: 33.1 g/dL (ref 30.0–36.0)
MCV: 76.5 fL — ABNORMAL LOW (ref 78.0–100.0)
Platelets: 301 10*3/uL (ref 150–400)
RBC: 3.24 MIL/uL — AB (ref 3.87–5.11)
RDW: 15.1 % (ref 11.5–15.5)
WBC: 9.2 10*3/uL (ref 4.0–10.5)

## 2017-06-29 LAB — COMPREHENSIVE METABOLIC PANEL
ALK PHOS: 76 U/L (ref 38–126)
ALT: 11 U/L — AB (ref 14–54)
AST: 17 U/L (ref 15–41)
Albumin: 3.1 g/dL — ABNORMAL LOW (ref 3.5–5.0)
Anion gap: 10 (ref 5–15)
BUN: 26 mg/dL — AB (ref 6–20)
CALCIUM: 8.7 mg/dL — AB (ref 8.9–10.3)
CHLORIDE: 106 mmol/L (ref 101–111)
CO2: 22 mmol/L (ref 22–32)
CREATININE: 2.62 mg/dL — AB (ref 0.44–1.00)
GFR, EST AFRICAN AMERICAN: 21 mL/min — AB (ref >60)
GFR, EST NON AFRICAN AMERICAN: 18 mL/min — AB (ref >60)
Glucose, Bld: 212 mg/dL — ABNORMAL HIGH (ref 65–99)
Potassium: 3.3 mmol/L — ABNORMAL LOW (ref 3.5–5.1)
Sodium: 138 mmol/L (ref 135–145)
Total Bilirubin: 0.9 mg/dL (ref 0.3–1.2)
Total Protein: 6.9 g/dL (ref 6.5–8.1)

## 2017-06-29 LAB — HEPARIN LEVEL (UNFRACTIONATED): HEPARIN UNFRACTIONATED: 0.37 [IU]/mL (ref 0.30–0.70)

## 2017-06-29 LAB — TROPONIN I: Troponin I: 1.43 ng/mL (ref <0.03)

## 2017-06-29 MED ORDER — POTASSIUM CHLORIDE CRYS ER 20 MEQ PO TBCR
40.0000 meq | EXTENDED_RELEASE_TABLET | Freq: Once | ORAL | Status: DC
Start: 2017-06-29 — End: 2017-06-29
  Filled 2017-06-29: qty 2

## 2017-06-29 MED ORDER — ORAL CARE MOUTH RINSE
15.0000 mL | Freq: Two times a day (BID) | OROMUCOSAL | Status: DC
  Administered 2017-06-29: 15 mL via OROMUCOSAL

## 2017-06-29 MED ORDER — POTASSIUM CHLORIDE 20 MEQ/15ML (10%) PO SOLN
40.0000 meq | Freq: Once | ORAL | Status: AC
  Administered 2017-06-29: 40 meq via ORAL
  Filled 2017-06-29: qty 30

## 2017-06-29 MED ORDER — IPRATROPIUM-ALBUTEROL 0.5-2.5 (3) MG/3ML IN SOLN
3.0000 mL | Freq: Three times a day (TID) | RESPIRATORY_TRACT | Status: DC
  Administered 2017-06-29 – 2017-07-01 (×7): 3 mL via RESPIRATORY_TRACT
  Filled 2017-06-29 (×7): qty 3

## 2017-06-29 NOTE — Progress Notes (Signed)
Name: Sarah [name] MRN:   [id number] DOB:   [birth year]             ADMISSION DATE:  06/27/2017 CONSULTATION DATE: 06/28/17  REFERRING MD:  Dr Toniann Fail  CHIEF COMPLAINT: SOB  HISTORY OF PRESENT ILLNESS:   65yoF with history of Diastolic CHF, CVA, HTN, Gout, DM, CKD, OA, and is s/p left knee arthroplasty 05/16/17 after which she went to rehab for 2 weeks. She now presents to the ER c/o SOB x several days that worsened greatly today. She c/o cough productive of frothy yellow/white sputum and also says she have had N/V several times over the past day which also looks like frothy white/yellow emesis. She admits to CP earlier (now resolved), some wheezing, and SOB. In the ER she was initially in respiratory distress and was placed on BIPAP (started at 12:40am). CXR showed multifocal bilateral infiltrates, and it was unclear if they represented pulmonary edema vs pneumonia. She was therefore started on antibiotics and given lasix.    SUBJECTIVE Feels better  OBJECTIVE BP 139/72 (BP Location: Right Arm)   Pulse 92   Temp 98.6 F (37 C) (Axillary)   Resp (!) 25   Ht 5\' 7"  (1.702 m)   Wt 171 lb (77.6 kg)   SpO2 99%   BMI 26.78 kg/m   5 liters   Intake/Output Summary (Last 24 hours) at 06/29/17 1248 Last data filed at 06/29/17 1200  Gross per 24 hour  Intake              700 ml  Output             2025 ml  Net            -1325 ml   Physical Exam  Constitutional: She is oriented to person, place, and time. She appears well-developed and well-nourished. No distress.  HENT:  Head: Normocephalic and atraumatic.  Mouth/Throat: No oropharyngeal exudate.  Eyes: Pupils are equal, round, and reactive to light. Conjunctivae and EOM are normal.  Neck: Normal range of motion. No JVD present.  Cardiovascular: Normal rate and regular rhythm.   No murmur heard. Pitting LE edema   Pulmonary/Chest: Effort normal. She has no wheezes. She has rhonchi.  Abdominal: Normal appearance and bowel  sounds are normal. She exhibits no distension. There is no tenderness.  Musculoskeletal: Normal range of motion.  Neurological: She is alert and oriented to person, place, and time.  Skin: Skin is warm, dry and intact.  Psychiatric: She has a normal mood and affect. Her speech is normal and behavior is normal.   CBC Recent Labs     06/28/17  0230  06/28/17  0408  06/29/17  0203  WBC  11.7*  11.4*  9.2  HGB  9.4*  9.2*  8.2*  HCT  29.1*  28.7*  24.8*  PLT  364  399  301    Coag's No results for input(s): APTT, INR in the last 72 hours.  BMET Recent Labs     06/27/17  2240  06/28/17  0230  06/28/17  0408  06/29/17  0203  NA  136   --   136  138  K  4.9   --   3.9  3.3*  CL  102   --   104  106  CO2  21*   --   18*  22  BUN  25*   --   26*  26*  CREATININE  2.35*  2.56*  2.44*  2.62*  GLUCOSE  226*   --   243*  212*    Electrolytes Recent Labs     06/27/17  2240  06/28/17  0408  06/29/17  0203  CALCIUM  8.7*  8.8*  8.7*    Sepsis Markers Recent Labs     06/28/17  0230  06/28/17  0601  PROCALCITON  <0.10  0.14    ABG Recent Labs     06/27/17  2306  PHART  7.473*  PCO2ART  29.2*  PO2ART  47.0*    Liver Enzymes Recent Labs     06/27/17  2240  06/28/17  0408  06/29/17  0203  AST  40  27  17  ALT  16  11*  11*  ALKPHOS  93  90  76  BILITOT  1.4*  1.0  0.9  ALBUMIN  3.5  3.4*  3.1*    Cardiac Enzymes Recent Labs     06/28/17  1023  06/28/17  1830  06/29/17  0203  TROPONINI  0.90*  1.50*  1.43*    Glucose Recent Labs     06/28/17  1654  06/28/17  2035  06/28/17  2356  06/29/17  0505  06/29/17  0807  06/29/17  1155  GLUCAP  153*  196*  207*  168*  166*  146*    Imaging Dg Chest 2 View  Result Date: 06/27/2017 CLINICAL DATA:  66 y/o  F; cough, shortness of breath, nausea. EXAM: CHEST  2 VIEW COMPARISON:  05/09/2017 chest radiograph FINDINGS: Stable normal cardiac silhouette. Patchy diffuse pulmonary opacities greatest in mid and  upper lung zones may represent alveolar pulmonary edema or multifocal pneumonia. No pleural effusion or pneumothorax. Anterior cervical fusion hardware noted. IMPRESSION: Patchy opacities in mid and upper lung zones may represent developing alveolar pulmonary edema or multifocal pneumonia. Electronically Signed   By: Mitzi HansenLance  Furusawa-Stratton M.D.   On: 06/27/2017 21:06   ASSESSMENT / PLAN: 65yoF with history of Diastolic CHF, CVA, HTN, Gout, DM, CKD, OA, and is s/p left knee arthroplasty 05/16/17, now admitted with worsening SOB and Productive cough over several days, as well as N/V (several times over 1 day), and increasing LE edema. Found to have Acute hypoxic respiratory failure for which she was placed on BIPAP.  -now weaning oxygen -favor HF > infection  -cont current plan of care -dc vanc w/ renal fxn so poor -we will s/o  Acute Hypoxic Respiratory failure; Multifocal pulmonary infiltrates (CHF exacerbation vs HCAP) Off NIPPV pcxr personally reviewed from 7/13 multifocal infiltrates Still on 6 liters [place] Wbc trending down No sig fever PCT was negative Plan Wean Oxygen (suspect that this can be done much more aggressively) Mobilize  Cont to trend PCT Cont lasix  F/u PCXR  Change bipap to PRN See sepsis below re: abx  SIRS/sepsis.  R/o PNA. Also concerned about possible infected knee but level of suspicion is low Plan Cont zosyn Dc vanc as if was positive we should have seen something by now  F/u cultures  Narrow as indicated.   Hx Diastolic CHF NSTEMI: likely type 2 from demand ischemia, CEs now trending down. Cards following.  History HTN - EKG nonischemic Plan Cont tele Cont heparin gtt F/u ECHO Asa if hgb holds Cards planning ischemia eval at some point    CKD stage IV hypokalemia  baseline which fluctuates between 2-3 Seems to be tolerating diuresis  Plan Cont lasix  Dc vanc F/u chem in am  Anemia - Hgb 9.4 which appears to be at her baseline of 9-10;  likely anemia is related to her CKD Plan Trend cbc  DM:  Plan ssi   Left knee arthroplasty: - left knee joint is mildly warm; question if could be infected. Patient says he surgeon told her it was expected that it would be warm now 6 weeks out.  Plan F/u BCs  Chronic back pain Cont lidocaine patch   We will s/o call PRN  Simonne Martinet ACNP-BC [facility] Pulmonary/Critical Care Pager # [phone] OR # [phone] if no answer

## 2017-06-29 NOTE — Progress Notes (Signed)
PROGRESS NOTE  Sarah [name] ZOX:[id number] DOB: 20-Dec-1950 DOA: 06/27/2017 PCP: Gordy Savers, MD   LOS: 1 day   Brief Narrative / Interim history: 66 year old female with type 2 diabetes mellitus, chronic kidney disease stage IV, anemia, hypertension, hyperlipidemia, who came to the hospital due to increasing shortness of breath over the last 3 days.  She also had a knee replacement about 6 weeks ago.  Assessment & Plan: Principal Problem:   Acute respiratory failure (HCC) Active Problems:   Diabetes mellitus with renal complications (HCC)   Essential hypertension   Acute on chronic diastolic CHF (congestive heart failure) (HCC)   HCAP (healthcare-associated pneumonia)   Acute respiratory failure with hypoxia (HCC)   Acute hypoxic respiratory failure -requiring BiPAP on admission.  Critical care consulted, recommended stepdown admission.  Found on BiPAP this morning as well -Chest x-ray on admission patchy opacities in the mid and upper lung zones suggesting multifocal pneumonia versus developing edema. -Placed on broad-spectrum antibiotics, continue, cultures are negative so far -PE is also hard of differential, given elevated d-dimer she was started on empiric heparin infusion and a VQ scan is pending, not sure why has not been done  CHF -? BNP was elevated at 370, 2D echo is pending -Continue diuresis, blood pressure is stable -She does appear fluid overloaded with lower extremity edema as well as crackles on exam -Given elevated troponin greater than 1, reported chest pains at home in the last month, I have consulted cardiology to evaluate, appreciate input  Multifocal pneumonia -Continue broad-spectrum antibiotics with vancomycin and aztreonam -Fever curve improving, she has been afebrile overnight  Status post left knee replacement -Surgical site looks good, no erythema, no tenderness, minimal pain  Type 2 diabetes mellitus -Continue sliding scale insulin,  CBGs 140s-160s  Hypertension -IV hydralazine as needed, has been normotensive this morning  Chronic kidney disease stage IV -Creatinine between 2 and 3 at baseline, currently at baseline, monitor with diuresis.  2.4 yesterday, 2.6 today, little change  Anemia of chronic kidney disease -Hemoglobin appears at baseline   DVT prophylaxis: heparin infusion Code Status: Full code Family Communication: no family at bedside Disposition Plan: remain in SDU  Consultants:   PCCM  Cardiology   Procedures:   2D echo: pending  Antimicrobials:  Vancomycin 7/14 >>  Aztreonam 7/14 >>   Subjective: - no chest pain, shortness of breath, no abdominal pain, nausea or vomiting.  Feeling like her breathing is improving  Objective: Vitals:   06/29/17 0839 06/29/17 0857 06/29/17 0900 06/29/17 1222  BP:   138/65 139/72  Pulse:    92  Resp:    (!) 25  Temp:    98.6 F (37 C)  TempSrc:    Axillary  SpO2: 98% 97%  99%  Weight:      Height:        Intake/Output Summary (Last 24 hours) at 06/29/17 1334 Last data filed at 06/29/17 1323  Gross per 24 hour  Intake            687.5 ml  Output             2350 ml  Net          -1662.5 ml   Filed Weights   06/27/17 2149 06/29/17 0817  Weight: 80.7 kg (178 lb) 77.6 kg (171 lb)    Examination:  Vitals:   06/29/17 0839 06/29/17 0857 06/29/17 0900 06/29/17 1222  BP:   138/65 139/72  Pulse:    92  Resp:    (!) 25  Temp:    98.6 F (37 C)  TempSrc:    Axillary  SpO2: 98% 97%  99%  Weight:      Height:        Constitutional: NAD Eyes: lids and conjunctivae normal Respiratory: Faint bibasilar crackles, rhonchi, no wheezing. Normal respiratory effort. No accessory muscle use.  Cardiovascular: Regular rate and rhythm, no murmurs / rubs / gallops. + LE edema. 2+ pedal pulses.  Abdomen: no tenderness. Bowel sounds positive.  Neurologic: CN 2-12 grossly intact. Strength 5/5 in all 4.  Psychiatric: Normal judgment and insight.  Alert and oriented x 3. Normal mood.    Data Reviewed: I have independently reviewed following labs and imaging studies   CBC:  Recent Labs Lab 06/27/17 2240 06/28/17 0230 06/28/17 0408 06/29/17 0203  WBC 10.7* 11.7* 11.4* 9.2  NEUTROABS 8.4*  --   --   --   HGB 10.1* 9.4* 9.2* 8.2*  HCT 30.5* 29.1* 28.7* 24.8*  MCV 77.0* 78.6 77.6* 76.5*  PLT 451* 364 399 301   Basic Metabolic Panel:  Recent Labs Lab 06/27/17 2240 06/28/17 0230 06/28/17 0408 06/29/17 0203  NA 136  --  136 138  K 4.9  --  3.9 3.3*  CL 102  --  104 106  CO2 21*  --  18* 22  GLUCOSE 226*  --  243* 212*  BUN 25*  --  26* 26*  CREATININE 2.35* 2.56* 2.44* 2.62*  CALCIUM 8.7*  --  8.8* 8.7*   GFR: Estimated Creatinine Clearance: 22.7 mL/min (A) (by C-G formula based on SCr of 2.62 mg/dL (H)). Liver Function Tests:  Recent Labs Lab 06/27/17 2240 06/28/17 0408 06/29/17 0203  AST 40 27 17  ALT 16 11* 11*  ALKPHOS 93 90 76  BILITOT 1.4* 1.0 0.9  PROT 7.3 7.5 6.9  ALBUMIN 3.5 3.4* 3.1*   No results for input(s): LIPASE, AMYLASE in the last 168 hours. No results for input(s): AMMONIA in the last 168 hours. Coagulation Profile: No results for input(s): INR, PROTIME in the last 168 hours. Cardiac Enzymes:  Recent Labs Lab 06/28/17 0230 06/28/17 0408 06/28/17 1023 06/28/17 1830 06/29/17 0203  TROPONINI 0.27* 0.37* 0.90* 1.50* 1.43*   BNP (last 3 results) No results for input(s): PROBNP in the last 8760 hours. HbA1C: No results for input(s): HGBA1C in the last 72 hours. CBG:  Recent Labs Lab 06/28/17 2035 06/28/17 2356 06/29/17 0505 06/29/17 0807 06/29/17 1155  GLUCAP 196* 207* 168* 166* 146*   Lipid Profile: No results for input(s): CHOL, HDL, LDLCALC, TRIG, CHOLHDL, LDLDIRECT in the last 72 hours. Thyroid Function Tests: No results for input(s): TSH, T4TOTAL, FREET4, T3FREE, THYROIDAB in the last 72 hours. Anemia Panel: No results for input(s): VITAMINB12, FOLATE, FERRITIN,  TIBC, IRON, RETICCTPCT in the last 72 hours. Urine analysis:    Component Value Date/Time   COLORURINE YELLOW 06/28/2017 0735   APPEARANCEUR CLOUDY (A) 06/28/2017 0735   LABSPEC 1.015 06/28/2017 0735   PHURINE 5.0 06/28/2017 0735   GLUCOSEU 150 (A) 06/28/2017 0735   HGBUR SMALL (A) 06/28/2017 0735   BILIRUBINUR NEGATIVE 06/28/2017 0735   KETONESUR NEGATIVE 06/28/2017 0735   PROTEINUR 100 (A) 06/28/2017 0735   NITRITE NEGATIVE 06/28/2017 0735   LEUKOCYTESUR SMALL (A) 06/28/2017 0735   Sepsis Labs: Invalid input(s): PROCALCITONIN, LACTICIDVEN  Recent Results (from the past 240 hour(s))  MRSA PCR Screening     Status: None   Collection Time: 06/28/17  9:02 AM  Result Value Ref Range Status   MRSA by PCR NEGATIVE NEGATIVE Final    Comment:        The GeneXpert MRSA Assay (FDA approved for NASAL specimens only), is one component of a comprehensive MRSA colonization surveillance program. It is not intended to diagnose MRSA infection nor to guide or monitor treatment for MRSA infections.       Radiology Studies: Dg Chest 2 View  Result Date: 06/27/2017 CLINICAL DATA:  66 y/o  F; cough, shortness of breath, nausea. EXAM: CHEST  2 VIEW COMPARISON:  05/09/2017 chest radiograph FINDINGS: Stable normal cardiac silhouette. Patchy diffuse pulmonary opacities greatest in mid and upper lung zones may represent alveolar pulmonary edema or multifocal pneumonia. No pleural effusion or pneumothorax. Anterior cervical fusion hardware noted. IMPRESSION: Patchy opacities in mid and upper lung zones may represent developing alveolar pulmonary edema or multifocal pneumonia. Electronically Signed   By: Mitzi HansenLance  Furusawa-Stratton M.D.   On: 06/27/2017 21:06     Scheduled Meds: . furosemide  40 mg Intravenous Q12H  . insulin aspart  0-9 Units Subcutaneous Q4H  . ipratropium-albuterol  3 mL Nebulization TID  . lidocaine  1 patch Transdermal Q24H   Continuous Infusions: . aztreonam Stopped  (06/29/17 1353)  . heparin 1,250 Units/hr (06/28/17 2022)    Sarah Pertostin Briyanna Billingham, MD, PhD [facility] Pager [phone]  If 7PM-7AM, please contact night-coverage [email or web address] Password [email or web address] 06/29/2017, 1:34 PM

## 2017-06-29 NOTE — Plan of Care (Signed)
Problem: Physical Regulation: Goal: Ability to maintain clinical measurements within normal limits will improve Outcome: Not Progressing At beginning of shift, patient O2 Saturation dropping into 80's. Patient placed back on Bipap machine. Remained on Bipap overnight. Patient being followed closely for Lactic Acid and Troponin levels. MD aware and following.   Problem: Tissue Perfusion: Goal: Risk factors for ineffective tissue perfusion will decrease Outcome: Progressing Patient on heparin gtt. Therapeutic level maintained.   Problem: Activity: Goal: Risk for activity intolerance will decrease Outcome: Progressing Patient able to use BSC with standby assist.   Problem: Fluid Volume: Goal: Ability to maintain a balanced intake and output will improve Outcome: Progressing Patient given lasix and swelling in legs has decreased slightly.   Problem: Nutrition: Goal: Adequate nutrition will be maintained Outcome: Not Progressing Patient NPO while on Bipap.

## 2017-06-29 NOTE — Progress Notes (Signed)
ANTICOAGULATION CONSULT NOTE - Follow Up Consult  Pharmacy Consult for Heparin Indication: rule out pulmonary embolus   Allergies  Allergen Reactions  . Metoprolol Swelling    SWELLING REACTION UNSPECIFIED   . Cefaclor Hives  . Tetanus Toxoid Adsorbed Swelling    SWELLING REACTION UNSPECIFIED     Patient Measurements: Height: 5' 7" (170.2 cm) Weight: 171 lb (77.6 kg) IBW/kg (Calculated) : 61.6 Heparin Dosing Weight: 78.1 kg  Vital Signs: Temp: 98.6 F (37 C) (07/15 1222) Temp Source: Axillary (07/15 1222) BP: 139/72 (07/15 1222) Pulse Rate: 92 (07/15 1222)  Labs:  Recent Labs  06/28/17 0230 06/28/17 0408 06/28/17 1023 06/28/17 1804 06/28/17 1830 06/29/17 0203  HGB 9.4* 9.2*  --   --   --  8.2*  HCT 29.1* 28.7*  --   --   --  24.8*  PLT 364 399  --   --   --  301  HEPARINUNFRC  --   --  0.62 0.44  --  0.37  CREATININE 2.56* 2.44*  --   --   --  2.62*  TROPONINI 0.27* 0.37* 0.90*  --  1.50* 1.43*    Estimated Creatinine Clearance: 22.7 mL/min (A) (by C-G formula based on SCr of 2.62 mg/dL (H)).   Medications:  Prescriptions Prior to Admission  Medication Sig Dispense Refill Last Dose  . allopurinol (ZYLOPRIM) 300 MG tablet Take 1 tablet (300 mg total) by mouth daily. (Patient taking differently: Take 300 mg by mouth daily as needed. ) 90 tablet 3 prn  . amLODipine (NORVASC) 10 MG tablet Take 1 tablet (10 mg total) by mouth daily. 90 tablet 3 06/27/2017 at Unknown time  . aspirin EC 325 MG tablet Take 1 tablet (325 mg total) by mouth 2 (two) times daily after a meal. Take x 1 month post op to decrease risk of blood clots. 60 tablet 0 06/27/2017 at Unknown time  . atorvastatin (LIPITOR) 80 MG tablet TAKE 1 TABLET (80 MG TOTAL) BY MOUTH DAILY. 90 tablet 3 06/26/2017 at Unknown time  . COLCRYS 0.6 MG tablet Take 1 tablet (0.6 mg total) by mouth daily. (Patient taking differently: Take 0.6 mg by mouth daily as needed (gout flares). ) 10 tablet 5 prn  . cyclobenzaprine  (FLEXERIL) 5 MG tablet Take 1 tablet (5 mg total) by mouth 3 (three) times daily as needed for muscle spasms. 30 tablet 0 prn  . furosemide (LASIX) 20 MG tablet Take 1 tablet (20 mg total) by mouth daily. 30 tablet 1 06/26/2017 at Unknown time  . HYDROcodone-acetaminophen (NORCO) 10-325 MG tablet Take 1 tablet by mouth every 8 (eight) hours as needed. (Patient taking differently: Take 1 tablet by mouth every 8 (eight) hours as needed for moderate pain. ) 30 tablet 0 Past Week at Unknown time  . insulin aspart (NOVOLOG FLEXPEN) 100 UNIT/ML FlexPen 20 units prior to each meal 15 pen 1 06/27/2017 at Unknown time  . insulin glargine (LANTUS) 100 UNIT/ML injection 10 units at bedtime 10 mL 6 06/26/2017 at Unknown time  . lidocaine (LIDODERM) 5 % Place 1 patch onto the skin daily. Remove & Discard patch within 12 hours or as directed by MD   Past Week at Unknown time  . ACCU-CHEK FASTCLIX LANCETS MISC USE TO CHECK BLOOD SUGAR THREE TIMES A DAY BEFORE MEALS AND AT BEDTIME (Patient not taking: Reported on 06/27/2017) 306 each 0 Not Taking at Unknown time  . ACCU-CHEK SMARTVIEW test strip USE TO CHECK BLOOD SUGAR THREE TIMES A  DAY BEFORE MEALS AND AT BEDTIME (Patient not taking: Reported on 06/27/2017) 400 each 3 Not Taking at Unknown time  . Blood Glucose Monitoring Suppl (ACCU-CHEK NANO SMARTVIEW) w/Device KIT USE AS DIRECTED (Patient not taking: Reported on 06/27/2017) 1 kit 0 Not Taking at Unknown time  . Insulin Pen Needle (NOVOFINE) 30G X 8 MM MISC Inject 10 each into the skin as needed. (Patient not taking: Reported on 06/27/2017) 90 each 3 Not Taking at Unknown time  . LORazepam (ATIVAN) 0.5 MG tablet Take 1 tablet (0.5 mg total) by mouth 2 (two) times daily as needed for anxiety. (Patient not taking: Reported on 06/27/2017) 180 tablet 1 Not Taking at Unknown time  . potassium bicarbonate (KLOR-CON/EF) 25 MEQ disintegrating tablet Take 25 mEq by mouth daily. In 6 oz of water   Not Taking at Unknown time     Assessment: 66 yo F presented on 7/13 with SOB x several days that worsened greatly on 06/27/17. Admitted 7/13 PM for Acute respiratory failure hypoxia - probably secondary to pulmonary edema. Possible pneumonia also. D-dimer 4/23.    Pharmacy dosing IV heparin for r/o E.  VQ scan is pending. This morning the HL= 0.37, remains therapeutic on heparin 1250 units/hr No bleeding noteHgb 10.1 >>to 8.2, pltc wnl , no bleeding reported.  Goal of Therapy:  Heparin level 0.3-0.7 units/ml Monitor platelets by anticoagulation protocol: Yes   Plan:  Continue IV heparin gtt at 1250 units/hr Monitor daily heparin level, CBC, s/s of bleed F/u VQ scan results   Nicole Cella, RPh Clinical Pharmacist Pager: [phone] [place] [phone] 06/29/2017,1:34 PM

## 2017-06-29 NOTE — Consult Note (Signed)
Cardiology Consultation Note  Patient ID: Sarah [name], MRN: [id number], DOB/AGE: 1951-03-07 66 y.o. Admit date: 06/27/2017   Date of Consult: 06/29/2017 Primary Physician: Marletta Lor, MD Primary Cardiologist: New to [facility] - consult by Dr. Marlou Porch, MD Requesting Physician: Dr. Cruzita Lederer, MD  Chief Complaint: SOB Reason for Consult: Elevated troponin  HPI: Sarah [name] is a 66 y.o. female who is being seen today for the evaluation of elevated troponin at the request of Dr. Cruzita Lederer, MD. Patient has a h/o chronic diastolic CHF, CKD stage IV with baseline SCr 2-3, anemia of chronic disease, recent left knee replacement in early June 2018, prior stroke, DM2, HTN, heart murmur, HTN, and HLD who presented to [facility] with increased SOB and was found to have acute respiratory failure with hypoxia in the setting of likely multifocal PNA and possible acute CHF.   She does report a history of remote cardiac cath many years ago at an outside hospital without intervention. No ischemic evaluation since. She recently underwent knee replacement surgery in the first part of June 2018. She was placed on full-dose ASA time of discharge. She had been dealing with some swelling in the left leg since her replacement surgery with follow up lower extremity ultrasound being negative for DVT on 6/27. 3 days prior to her admission she and her sister had noticed increased SOB with cough that had been productive of white sputum coupled with mild orthopnea. She is not certain of her baseline/dry weight and does not weigh herself daily. Appetite has been low as she has been noting a fair amount of vomiting at home leading up to her admission. Never with chest pain. Afebrile, no chills oro myalgias. Patient was noted to be hypoxic in the ED requiring 15 L oxygen to maintain adequate saturations, followed by transition to CPAP, now on nasal cannula at 6 L. CXR showed patchy opacities in the mid lung possibly  representing pulmonary edema vs multifocal PNA. EKG not acute as below. Troponin 0.27-->0.37-->0.90-->1.50-->1.43, D-dimer 4.23, BNP 370, WBC 10.7-->11.7-->9.2, HGB 10.1-->8.2, PLT 451, lactic acid 5.4-->3.1, strep antigen negative, PCT 0.14, SCr 2.35-->2.62, K+ 3.3. She was started on aztreonam and vancomycin for possible PNA as well as heparin gtt for empiric treatment of possible PE given her elevated D-dimer, and IV Lasix with a UOP of 2.3 L to date. Weight 171 pounds. She has been febrile up to 100.8. Currently, without chest pain. VQ scan is pending this morning to evaluate for PE as she cannot undergo CTA chest given her CKD.   Past Medical History:  Diagnosis Date  . Allergy   . Anxiety    history   . Arthritis   . Cataract    history  . Chronic diastolic CHF (congestive heart failure) ([place])    a. TTE 2016: EF 60-65%, no RWMA, GR1DD, mild AS, trivial AI, mildly dilated LA, PFO unable to be excluded, PASP 38 mmHg  . Chronic kidney disease   . Diabetes mellitus   . Gout   . Heart murmur   . Hyperlipidemia   . Hypertension   . Peripheral neuropathy   . PONV (postoperative nausea and vomiting)   . Stroke [facility])       Most Recent Cardiac Studies: TTE 05/2015: Study Conclusions  - Left ventricle: The cavity size was normal. Wall thickness was   normal. Systolic function was normal. The estimated ejection   fraction was in the range of 60% to 65%. Wall motion was normal;  there were no regional wall motion abnormalities. Doppler   parameters are consistent with abnormal left ventricular   relaxation (grade 1 diastolic dysfunction). - Aortic valve: There was very mild stenosis. There was trivial   regurgitation. - Mitral valve: Mild focal thickening and calcification of the   anterior leaflet. - Left atrium: The atrium was mildly dilated. - Atrial septum: A patent foramen ovale cannot be excluded. - Pulmonary arteries: Systolic pressure was mildly increased. PA   peak  pressure: 38 mm Hg (S).   Surgical History:  Past Surgical History:  Procedure Laterality Date  . APPENDECTOMY    . CATARACT EXTRACTION, BILATERAL    . EYE SURGERY    . FRACTURE SURGERY     L foot  . HEMORRHOID SURGERY    . NECK SURGERY    . SPINE SURGERY     cervical spine  . TOTAL KNEE ARTHROPLASTY Left 05/16/2017   Procedure: TOTAL KNEE ARTHROPLASTY;  Surgeon: Dorna Leitz, MD;  Location: [facility];  Service: Orthopedics;  Laterality: Left;  . TUBAL LIGATION       Home Meds: Prior to Admission medications   Medication Sig Start Date End Date Taking? Authorizing Provider  allopurinol (ZYLOPRIM) 300 MG tablet Take 1 tablet (300 mg total) by mouth daily. Patient taking differently: Take 300 mg by mouth daily as needed.  12/23/16  Yes Marletta Lor, MD  amLODipine (NORVASC) 10 MG tablet Take 1 tablet (10 mg total) by mouth daily. 12/23/16  Yes Marletta Lor, MD  aspirin EC 325 MG tablet Take 1 tablet (325 mg total) by mouth 2 (two) times daily after a meal. Take x 1 month post op to decrease risk of blood clots. 05/16/17  Yes Gary Fleet, PA-C  atorvastatin (LIPITOR) 80 MG tablet TAKE 1 TABLET (80 MG TOTAL) BY MOUTH DAILY. 12/23/16  Yes Marletta Lor, MD  COLCRYS 0.6 MG tablet Take 1 tablet (0.6 mg total) by mouth daily. Patient taking differently: Take 0.6 mg by mouth daily as needed (gout flares).  12/23/16  Yes Marletta Lor, MD  cyclobenzaprine (FLEXERIL) 5 MG tablet Take 1 tablet (5 mg total) by mouth 3 (three) times daily as needed for muscle spasms. 06/20/17  Yes Marletta Lor, MD  furosemide (LASIX) 20 MG tablet Take 1 tablet (20 mg total) by mouth daily. 02/18/17  Yes Marletta Lor, MD  HYDROcodone-acetaminophen [facility]) 10-325 MG tablet Take 1 tablet by mouth every 8 (eight) hours as needed. Patient taking differently: Take 1 tablet by mouth every 8 (eight) hours as needed for moderate pain.  06/24/17  Yes Marletta Lor, MD  insulin aspart  (NOVOLOG FLEXPEN) 100 UNIT/ML FlexPen 20 units prior to each meal 12/31/16  Yes Marletta Lor, MD  insulin glargine (LANTUS) 100 UNIT/ML injection 10 units at bedtime 06/24/17  Yes Marletta Lor, MD  lidocaine (LIDODERM) 5 % Place 1 patch onto the skin daily. Remove & Discard patch within 12 hours or as directed by MD   Yes [provider]  ACCU-CHEK FASTCLIX LANCETS MISC USE TO [place] A DAY BEFORE MEALS AND AT BEDTIME Patient not taking: Reported on 06/27/2017 02/04/17   Marletta Lor, MD  ACCU-CHEK SMARTVIEW test strip USE TO CHECK BLOOD SUGAR THREE TIMES A DAY BEFORE MEALS AND AT BEDTIME Patient not taking: Reported on 06/27/2017 02/04/17   Marletta Lor, MD  Blood Glucose Monitoring Suppl (ACCU-CHEK NANO SMARTVIEW) w/Device KIT USE AS DIRECTED Patient not  taking: Reported on 06/27/2017 12/23/16   Marletta Lor, MD  Insulin Pen Needle (NOVOFINE) 30G X 8 MM MISC Inject 10 each into the skin as needed. Patient not taking: Reported on 06/27/2017 12/23/16   Marletta Lor, MD  LORazepam (ATIVAN) 0.5 MG tablet Take 1 tablet (0.5 mg total) by mouth 2 (two) times daily as needed for anxiety. Patient not taking: Reported on 06/27/2017 12/23/16   Marletta Lor, MD  potassium bicarbonate (KLOR-CON/EF) 25 MEQ disintegrating tablet Take 25 mEq by mouth daily. In 6 oz of water    [provider]    Inpatient Medications:  . furosemide  40 mg Intravenous Q12H  . insulin aspart  0-9 Units Subcutaneous Q4H  . ipratropium-albuterol  3 mL Nebulization TID  . lidocaine  1 patch Transdermal Q24H   . aztreonam Stopped (06/29/17 0544)  . heparin 1,250 Units/hr (06/28/17 2022)  . vancomycin Stopped (06/29/17 0300)    Allergies:  Allergies  Allergen Reactions  . Metoprolol Swelling    SWELLING REACTION UNSPECIFIED   . Cefaclor Hives  . Tetanus Toxoid Adsorbed Swelling    SWELLING REACTION UNSPECIFIED     Social History    Social History  . Marital status: Single    Spouse name: N/A  . Number of children: N/A  . Years of education: N/A   Occupational History  . Not on file.   Social History Main Topics  . Smoking status: Never Smoker  . Smokeless tobacco: Never Used  . Alcohol use No  . Drug use: No  . Sexual activity: Not on file   Other Topics Concern  . Not on file   Social History Narrative  . No narrative on file     Family History  Problem Relation Age of Onset  . Cancer Mother   . Hypertension Mother   . Diabetes Mother   . Heart disease Father      Review of Systems: Review of Systems  Constitutional: Positive for malaise/fatigue. Negative for chills, diaphoresis, fever and weight loss.  HENT: Negative for congestion.   Eyes: Negative for discharge and redness.  Respiratory: Positive for cough, sputum production and shortness of breath. Negative for hemoptysis and wheezing.        Cough productive of white sputum  Cardiovascular: Positive for orthopnea and leg swelling. Negative for chest pain, palpitations, claudication and PND.  Gastrointestinal: Negative for abdominal pain, blood in stool, heartburn, melena, nausea and vomiting.  Genitourinary: Negative for hematuria.  Musculoskeletal: Negative for falls and myalgias.  Skin: Negative for rash.  Neurological: Positive for weakness. Negative for dizziness, tingling, tremors, sensory change, speech change, focal weakness and loss of consciousness.  Endo/Heme/Allergies: Does not bruise/bleed easily.  Psychiatric/Behavioral: Negative for substance abuse. The patient is not nervous/anxious.   All other systems reviewed and are negative.   Labs:  Recent Labs  06/28/17 0408 06/28/17 1023 06/28/17 1830 06/29/17 0203  TROPONINI 0.37* 0.90* 1.50* 1.43*   Lab Results  Component Value Date   WBC 9.2 06/29/2017   HGB 8.2 (L) 06/29/2017   HCT 24.8 (L) 06/29/2017   MCV 76.5 (L) 06/29/2017   PLT 301 06/29/2017      Recent Labs Lab 06/29/17 0203  NA 138  K 3.3*  CL 106  CO2 22  BUN 26*  CREATININE 2.62*  CALCIUM 8.7*  PROT 6.9  BILITOT 0.9  ALKPHOS 76  ALT 11*  AST 17  GLUCOSE 212*   Lab Results  Component Value Date  CHOL 270 (H) 01/09/2015   HDL 54.00 01/09/2015   LDLCALC 207 (H) 07/08/2014   TRIG 275.0 (H) 01/09/2015   Lab Results  Component Value Date   DDIMER 4.23 (H) 06/28/2017    Radiology/Studies:  Dg Chest 2 View  Result Date: 06/27/2017 CLINICAL DATA:  66 y/o  F; cough, shortness of breath, nausea. EXAM: CHEST  2 VIEW COMPARISON:  05/09/2017 chest radiograph FINDINGS: Stable normal cardiac silhouette. Patchy diffuse pulmonary opacities greatest in mid and upper lung zones may represent alveolar pulmonary edema or multifocal pneumonia. No pleural effusion or pneumothorax. Anterior cervical fusion hardware noted. IMPRESSION: Patchy opacities in mid and upper lung zones may represent developing alveolar pulmonary edema or multifocal pneumonia. Electronically Signed   By: Kristine Garbe M.D.   On: 06/27/2017 21:06    EKG: Interpreted by me showed: NSR, 85 bpm, rare PVC, nonspecific st/t changes Telemetry: Interpreted by me showed: NSR, 80s bpm  Weights: [employer]   06/27/17 2149 06/29/17 0817  Weight: 178 lb (80.7 kg) 171 lb (77.6 kg)     Physical Exam: Blood pressure 138/65, pulse 74, temperature 98.6 F (37 C), temperature source Axillary, resp. rate (!) 22, height 5' 7" (1.702 m), weight 171 lb (77.6 kg), SpO2 97 %. Body mass index is 26.78 kg/m. General: Well developed, well nourished, in no acute distress. Head: Normocephalic, atraumatic, sclera non-icteric, no xanthomas, nares are without discharge.  Neck: Negative for carotid bruits. JVD elevated ~ 8 cm. Lungs: Diminished breath sounds bilaterally with bibasilar crackles and diffuse rhonchi along left mid lung. Faint wheezing. Breathing is mildly unlabored on 6 L oxygen via nasal  cannula. Heart: RRR with S1 S2. II/VI systolic murmur RUSB, no rubs, or gallops appreciated. Abdomen: Soft, non-tender, non-distended with normoactive bowel sounds. No hepatomegaly. No rebound/guarding. No obvious abdominal masses. Msk:  Strength and tone appear normal for age. Extremities: No clubbing or cyanosis. Trace to 1+ pitting edema L>R lower extremity. No LE erythema, warmth, or cording. Distal pedal pulses are 2+ and equal bilaterally. Neuro: Alert and oriented X 3. No facial asymmetry. No focal deficit. Moves all extremities spontaneously. Psych:  Responds to questions appropriately with a normal affect.    Assessment and Plan:  Principal Problem:   Acute respiratory failure ([place]) Active Problems:   Diabetes mellitus with renal complications ([facility])   Essential hypertension   Acute on chronic diastolic CHF (congestive heart failure) ([place])   HCAP (healthcare-associated pneumonia)   Acute respiratory failure with hypoxia ([place])    1. Demand ischemia: -Mildly elevated troponin with a peak of 1.50, now down trending -Likely 2/2 supply demand ischemia in the setting of underlying coronary disease with acute respiratory failure with hypoxia 2/2 likely multifocal PNA, acute on chronic diastolic CHF, anemia of chronic disease, and CKD stage IV -On heparin gtt for empiric treatment of possible PE given her hypoxia and recent orthopedic surgery -Check TTE to evaluate LV systolic function -Would benefit from ischemic evaluation once acute illness and respiratory status are improved -Timing of ischemic evaluation can be determined depending on the results of her TTE -Not an ideal cardiac cath candidate at this time given her acute illness and underlying CKD with AKI -Consider addition of ASA if HGB remains stable -Hold on starting beta blocker at this time given wheezing  2. Acute respiratory failure with hypoxia: -Likely multifactorial including multifocal PNA and acute on chronic  diastolic CHF -Wean oxygen as able  3. Acute on chronic diastolic CHF: -She does appear mildly volume  overloaded -IV Lasix with KCl repletion and close monitoring of SCr -TTE pending  4. Multifocal PNA: -Aztreonam and vancomycin per primary -Nebs per primary   5. Acute on CKD stage IV: -Monitor with diuresis  -If renal function continues to trend upwards may need to hold Lasix  6. Anemia of chronic disease: -Maintain HGB > 8.5  7. HTN: -Controlled  Signed, Marcille Blanco [place] Pager: [phone] 06/29/2017, 11:33 AM  Personally seen and examined. Agree with above.  66 year old female approximate one month postop left knee replacement here with significant shortness of breath, fever, orthopnea, lactic acidosis, elevated troponin.  Exam: Alert, mild to moderately increased respiratory effort laying in bed. Lungs rhonchorous bilaterally, heart: Regular rate and rhythm, no significant murmurs, mid neck JVD, left knee scar normal, no erythema in calves bilaterally.  Chest x-ray personally reviewed demonstrates patchy opacities bilaterally suggestive of multifocal pneumonia but cannot exclude interstitial edema pattern. Question beginning of ARDS.  Echocardiogram pending. EKG with no evidence of ischemia. Personally viewed.  Elevated troponin  - Likely demand ischemia in the setting of her current metabolic derangement, multifocal pneumonia  - Troponin is now trending downward.  - Once she is over this illness, I would suggest outpatient Myoview to exclude any high risk ischemia.  - Awaiting echocardiogram. Obviously if her ejection fraction is markedly reduced, we may wish to pursue invasive evaluation here in the hospital potentially if her renal function allows.   Acute diastolic heart failure  - There was no evidence of cardiomegaly on chest x-ray. I would suspect her EF is normal however we are checking an echocardiogram.  - Continue with IV Lasix for now.  -  Creatinine mildly increased from before-2.62  - If fluid status remains a conundrum, consider right heart catheterization/Swan-Ganz catheter.  Multifocal pneumonia  - Mild fever, patchy bilateral chest x-ray point in this direction, although  this may represent edema. Treating aggressively for both.  - Treating with IV antibiotics.  - Also a VQ scan has been ordered. With her recent knee surgery a month ago, pulmonary embolism is also on the differential. On 06/11/17 lower extremity vascular Dopplers were performed which were negative for DVT.  Elevated troponin does increase her overall cardiovascular risk. Note, prior echocardiogram in 2016 showed normal ejection fraction with mildly elevated pulmonary pressures.  Candee Furbish, MD

## 2017-06-30 ENCOUNTER — Ambulatory Visit: Payer: Medicare HMO | Admitting: Internal Medicine

## 2017-06-30 ENCOUNTER — Inpatient Hospital Stay (HOSPITAL_COMMUNITY): Payer: Medicare HMO

## 2017-06-30 DIAGNOSIS — R748 Abnormal levels of other serum enzymes: Secondary | ICD-10-CM

## 2017-06-30 DIAGNOSIS — I10 Essential (primary) hypertension: Secondary | ICD-10-CM

## 2017-06-30 DIAGNOSIS — J811 Chronic pulmonary edema: Secondary | ICD-10-CM

## 2017-06-30 LAB — COMPREHENSIVE METABOLIC PANEL
ALK PHOS: 74 U/L (ref 38–126)
ALT: 9 U/L — AB (ref 14–54)
AST: 18 U/L (ref 15–41)
Albumin: 3 g/dL — ABNORMAL LOW (ref 3.5–5.0)
Anion gap: 11 (ref 5–15)
BUN: 25 mg/dL — ABNORMAL HIGH (ref 6–20)
CALCIUM: 8.8 mg/dL — AB (ref 8.9–10.3)
CO2: 23 mmol/L (ref 22–32)
CREATININE: 2.69 mg/dL — AB (ref 0.44–1.00)
Chloride: 104 mmol/L (ref 101–111)
GFR, EST AFRICAN AMERICAN: 20 mL/min — AB (ref >60)
GFR, EST NON AFRICAN AMERICAN: 17 mL/min — AB (ref >60)
Glucose, Bld: 143 mg/dL — ABNORMAL HIGH (ref 65–99)
Potassium: 3.6 mmol/L (ref 3.5–5.1)
Sodium: 138 mmol/L (ref 135–145)
Total Bilirubin: 0.7 mg/dL (ref 0.3–1.2)
Total Protein: 7 g/dL (ref 6.5–8.1)

## 2017-06-30 LAB — PROCALCITONIN: Procalcitonin: 0.24 ng/mL

## 2017-06-30 LAB — CBC
HEMATOCRIT: 27.5 % — AB (ref 36.0–46.0)
Hemoglobin: 8.9 g/dL — ABNORMAL LOW (ref 12.0–15.0)
MCH: 24.9 pg — AB (ref 26.0–34.0)
MCHC: 32.4 g/dL (ref 30.0–36.0)
MCV: 76.8 fL — AB (ref 78.0–100.0)
Platelets: 347 10*3/uL (ref 150–400)
RBC: 3.58 MIL/uL — ABNORMAL LOW (ref 3.87–5.11)
RDW: 14.7 % (ref 11.5–15.5)
WBC: 6.8 10*3/uL (ref 4.0–10.5)

## 2017-06-30 LAB — GLUCOSE, CAPILLARY
GLUCOSE-CAPILLARY: 133 mg/dL — AB (ref 65–99)
GLUCOSE-CAPILLARY: 150 mg/dL — AB (ref 65–99)
GLUCOSE-CAPILLARY: 130 mg/dL — AB (ref 65–99)
Glucose-Capillary: 217 mg/dL — ABNORMAL HIGH (ref 65–99)
Glucose-Capillary: 290 mg/dL — ABNORMAL HIGH (ref 65–99)
Glucose-Capillary: 149 mg/dL — ABNORMAL HIGH (ref 65–99)

## 2017-06-30 LAB — HEPARIN LEVEL (UNFRACTIONATED): Heparin Unfractionated: 0.36 IU/mL (ref 0.30–0.70)

## 2017-06-30 MED ORDER — CYCLOBENZAPRINE HCL 10 MG PO TABS
10.0000 mg | ORAL_TABLET | Freq: Once | ORAL | Status: AC
  Administered 2017-06-30: 10 mg via ORAL
  Filled 2017-06-30: qty 1

## 2017-06-30 MED ORDER — TECHNETIUM TO 99M ALBUMIN AGGREGATED
4.0000 | Freq: Once | INTRAVENOUS | Status: AC | PRN
  Administered 2017-06-30: 4 via INTRAVENOUS

## 2017-06-30 MED ORDER — COLCHICINE 0.6 MG PO TABS
0.6000 mg | ORAL_TABLET | Freq: Once | ORAL | Status: AC
  Administered 2017-06-30: 0.6 mg via ORAL
  Filled 2017-06-30: qty 1

## 2017-06-30 MED ORDER — COLCHICINE 0.6 MG PO TABS
0.3000 mg | ORAL_TABLET | Freq: Every day | ORAL | Status: DC
  Administered 2017-07-01 – 2017-07-02 (×2): 0.3 mg via ORAL
  Filled 2017-06-30 (×3): qty 1

## 2017-06-30 MED ORDER — TECHNETIUM TC 99M DIETHYLENETRIAME-PENTAACETIC ACID: 30.0000 | Freq: Once | INTRAVENOUS | Status: DC | PRN

## 2017-06-30 MED ORDER — LEVOFLOXACIN IN D5W 750 MG/150ML IV SOLN
750.0000 mg | INTRAVENOUS | Status: DC
  Administered 2017-06-30: 750 mg via INTRAVENOUS
  Filled 2017-06-30: qty 150

## 2017-06-30 NOTE — Progress Notes (Signed)
ANTICOAGULATION CONSULT NOTE - Follow Up Consult  Pharmacy Consult for Heparin Indication: rule out pulmonary embolus    Patient Measurements: Height: 5\' 7"  (170.2 cm) Weight: 155 lb 4.8 oz (70.4 kg) IBW/kg (Calculated) : 61.6 Heparin Dosing Weight: 70.4 kg  Vital Signs: Temp: 97.4 F (36.3 C) (07/16 0805) Temp Source: Oral (07/16 0805) BP: 152/57 (07/16 0805) Pulse Rate: 86 (07/16 0805)  Labs:  Recent Labs  06/28/17 0408  06/28/17 1023 06/28/17 1804 06/28/17 1830 06/29/17 0203 06/30/17 0425  HGB 9.2*  --   --   --   --  8.2* 8.9*  HCT 28.7*  --   --   --   --  24.8* 27.5*  PLT 399  --   --   --   --  301 347  HEPARINUNFRC  --   < > 0.62 0.44  --  0.37 0.36  CREATININE 2.44*  --   --   --   --  2.62* 2.69*  TROPONINI 0.37*  --  0.90*  --  1.50* 1.43*  --   < > = values in this interval not displayed.  Estimated Creatinine Clearance: 20 mL/min (A) (by C-G formula based on SCr of 2.69 mg/dL (H)).   Assessment: [age] YOF s/p recent L-TKA on 05/16/17 who presented on 7/14 with SOB concerning for PE. Pharmacy consult to start Heparin for anticoagulation.   Heparin level this morning remains therapeutic (HL 0.36 << 0.37, goal of 0.3-0.7). CBC low but stable. No overt s/sx of bleeding are noted at this time.   Goal of Therapy:  Heparin level 0.3-0.7 units/ml Monitor platelets by anticoagulation protocol: Yes   Plan:  1. Continue Heparin at 1250 units/hr (12.5 ml/hr) 2. Will continue to monitor for any signs/symptoms of bleeding and will follow up with heparin level in the a.m.   Thank you for allowing pharmacy to be a part of this patient's care.  Georgina PillionElizabeth Aaren Atallah, PharmD, BCPS Clinical Pharmacist Pager: [phone] Clinical phone for 06/30/2017 from 7a-3:30p: [phone] If after 3:30p, please call main pharmacy at: [phone] 06/30/2017 9:33 AM

## 2017-06-30 NOTE — Progress Notes (Signed)
Pharmacy Antibiotic Note  Starling MannsMickey L Boulet is a [age] YOF s/p recent L-TKA on 05/16/17 who presented on 7/14 with SOB concerning for PNA vs PE. Pharmacy has been consulted for Levaquin dosing.   Plan: 1. Start Levaquin 750 mg IV every 48 hours 2. Will continue to follow renal function, culture results, LOT, and antibiotic de-escalation plans   Height: 5\' 7"  (170.2 cm) Weight: 155 lb 4.8 oz (70.4 kg) IBW/kg (Calculated) : 61.6  Temp (24hrs), Avg:98.6 F (37 C), Min:97.4 F (36.3 C), Max:99.6 F (37.6 C)   Recent Labs Lab 06/27/17 2240  06/28/17 0230 06/28/17 0406 06/28/17 0408 06/28/17 0601 06/28/17 1023 06/28/17 2102 06/29/17 0203 06/30/17 0425  WBC 10.7*  --  11.7*  --  11.4*  --   --   --  9.2 6.8  CREATININE 2.35*  --  2.56*  --  2.44*  --   --   --  2.62* 2.69*  LATICACIDVEN  --   < > 5.4* 3.8*  --  3.6* 2.4* 3.1*  --   --   < > = values in this interval not displayed.  Estimated Creatinine Clearance: 20 mL/min (A) (by C-G formula based on SCr of 2.69 mg/dL (H)).    Allergies  Allergen Reactions  . Metoprolol Swelling    SWELLING REACTION UNSPECIFIED   . Cefaclor Hives  . Tetanus Toxoid Adsorbed Swelling    SWELLING REACTION UNSPECIFIED     Antimicrobials this admission: Vanc 7/13>>7/15 Aztreonam 7/13>> 7/16 LVQ 7/13 x1; restart 7/16  Dose adjustments this admission: n/a  Microbiology results: 7/14 BCx: ngtd 7/14 Sputum: sent 7/14 MRSA PCR: negative  Thank you for allowing pharmacy to be a part of this patient's care.  Georgina PillionElizabeth Passion Lavin, PharmD, BCPS Clinical Pharmacist Pager: [phone] Clinical phone for 06/30/2017 from 7a-3:30p: [phone] If after 3:30p, please call main pharmacy at: [phone] 06/30/2017 11:04 AM

## 2017-06-30 NOTE — Plan of Care (Signed)
Problem: Safety: Goal: Ability to remain free from injury will improve Outcome: Progressing Patient bed alarm and call light utilized. Patient displays no impulsive behavior- calls for assistance.   Problem: Pain Managment: Goal: General experience of comfort will improve Outcome: Not Progressing Patient with frequent complaints of pain in her left knee. Patient had knee replaced 6/1. Also frequent complaints of cramping. Patient takes flexeril at home for this. Received order for one time dose of flexeril from night shift MD.   Problem: Physical Regulation: Goal: Ability to maintain clinical measurements within normal limits will improve Outcome: Progressing Patient did not require Bipap overnight. No longer coughing up bloody or thick sputum. Lung sounds clear and diminished. Patient on 3 L / [place]. Patient due to have VQ scan. Was unable to be completed 7/15 due to Bipap.  Goal: Will remain free from infection Outcome: Progressing Patient receiving antibiotics.   Problem: Tissue Perfusion: Goal: Risk factors for ineffective tissue perfusion will decrease Outcome: Progressing Remains on heparin gtt.   Problem: Fluid Volume: Goal: Ability to maintain a balanced intake and output will improve Outcome: Progressing +3 edema still present in lower legs. Patient receiving lasix.

## 2017-06-30 NOTE — Progress Notes (Signed)
PROGRESS NOTE  Sarah [name] RUE:[id number] DOB: 03/28/1951 DOA: 06/27/2017 PCP: Gordy Savers, MD   LOS: 2 days   Brief Narrative / Interim history: 66 year old female with type 2 diabetes mellitus, chronic kidney disease stage IV, anemia, hypertension, hyperlipidemia, who came to the hospital due to increasing shortness of breath over the last 3 days.  She also had a knee replacement about 6 weeks ago.  Assessment & Plan: Principal Problem:   Acute respiratory failure (HCC) Active Problems:   Diabetes mellitus with renal complications (HCC)   Essential hypertension   Acute on chronic diastolic CHF (congestive heart failure) (HCC)   HCAP (healthcare-associated pneumonia)   Acute respiratory failure with hypoxia (HCC)   Acute hypoxic respiratory failure -requiring BiPAP on admission.  Critical care consulted, recommended stepdown admission.   -Chest x-ray on admission patchy opacities in the mid and upper lung zones suggesting multifocal pneumonia versus developing edema. -Placed on broad-spectrum antibiotics, continue, cultures are negative so far -PE is also hard of differential, given elevated d-dimer she was started on empiric heparin -Respiratory status is improving, she is off BiPAP and on nasal cannula.  VQ scan and 2D echo are pending.  CHF -? BNP was elevated at 370 -Continue diuresis, blood pressure is stable -She is net -3.2 L, however weight has gone down quite significantly from 178 outpatient to 155 this morning.  Lower extremity swelling is improved -2D echo is pending -Cardiology consulted on 7/15, appreciate input  Multifocal pneumonia -Continue broad-spectrum antibiotics with vancomycin and aztreonam -She continues to remain afebrile, will narrow antibiotics to Levaquin today  Status post left knee replacement -Surgical site looks good, no erythema, no tenderness, minimal pain  Type 2 diabetes mellitus -Continue sliding scale insulin, CBGs  140s-150s  Hypertension -IV hydralazine as needed, blood pressure into the 150s this morning  Chronic kidney disease stage IV -Creatinine between 2 and 3 at baseline, currently at baseline and appears to remain stable with diuresis  Anemia of chronic kidney disease -Hemoglobin appears at baseline   DVT prophylaxis: heparin infusion Code Status: Full code Family Communication: no family at bedside Disposition Plan: remain in SDU  Consultants:   PCCM  Cardiology   Procedures:   2D echo: pending  Antimicrobials:  Vancomycin 7/14 >> 7/16  Aztreonam 7/14 >> 7/16  Levofloxacin 7/16 >>  Subjective: -On nasal cannula this morning, she is feeling better, her breathing is better.  Denies any chest pain, however states that she has had an episode of chest pain last night lasting less than 30 minutes.  No abdominal pain, nausea or vomiting.  Objective: Vitals:   06/30/17 0418 06/30/17 0600 06/30/17 0740 06/30/17 0805  BP: 131/66 (!) 151/65  (!) 152/57  Pulse: 80 79  86  Resp: (!) 21 18  16   Temp: 99.6 F (37.6 C)   (!) 97.4 F (36.3 C)  TempSrc: Axillary   Oral  SpO2: 100% 100% 100% 100%  Weight: 70.4 kg (155 lb 4.8 oz)     Height:        Intake/Output Summary (Last 24 hours) at 06/30/17 1024 Last data filed at 06/30/17 0700  Gross per 24 hour  Intake              425 ml  Output             1425 ml  Net            -1000 ml   Filed Weights   06/27/17 2149 06/29/17 1478  06/30/17 0418  Weight: 80.7 kg (178 lb) 77.6 kg (171 lb) 70.4 kg (155 lb 4.8 oz)    Examination:  Vitals:   06/30/17 0418 06/30/17 0600 06/30/17 0740 06/30/17 0805  BP: 131/66 (!) 151/65  (!) 152/57  Pulse: 80 79  86  Resp: (!) 21 18  16   Temp: 99.6 F (37.6 C)   (!) 97.4 F (36.3 C)  TempSrc: Axillary   Oral  SpO2: 100% 100% 100% 100%  Weight: 70.4 kg (155 lb 4.8 oz)     Height:        Constitutional: NAD, calm, comfortable Eyes: lids and conjunctivae normal Respiratory: clear  to auscultation bilaterally, no wheezing, no crackles. Normal respiratory effort.  Cardiovascular: Regular rate and rhythm, no murmurs / rubs / gallops. + LE edema, improved. 2+ pedal pulses.  Abdomen: no tenderness. Bowel sounds positive.  Skin: no rashes, lesions, ulcers. No induration Neurologic: non focal   Data Reviewed: I have independently reviewed following labs and imaging studies   CBC:  Recent Labs Lab 06/27/17 2240 06/28/17 0230 06/28/17 0408 06/29/17 0203 06/30/17 0425  WBC 10.7* 11.7* 11.4* 9.2 6.8  NEUTROABS 8.4*  --   --   --   --   HGB 10.1* 9.4* 9.2* 8.2* 8.9*  HCT 30.5* 29.1* 28.7* 24.8* 27.5*  MCV 77.0* 78.6 77.6* 76.5* 76.8*  PLT 451* 364 399 301 347   Basic Metabolic Panel:  Recent Labs Lab 06/27/17 2240 06/28/17 0230 06/28/17 0408 06/29/17 0203 06/30/17 0425  NA 136  --  136 138 138  K 4.9  --  3.9 3.3* 3.6  CL 102  --  104 106 104  CO2 21*  --  18* 22 23  GLUCOSE 226*  --  243* 212* 143*  BUN 25*  --  26* 26* 25*  CREATININE 2.35* 2.56* 2.44* 2.62* 2.69*  CALCIUM 8.7*  --  8.8* 8.7* 8.8*   GFR: Estimated Creatinine Clearance: 20 mL/min (A) (by C-G formula based on SCr of 2.69 mg/dL (H)). Liver Function Tests:  Recent Labs Lab 06/27/17 2240 06/28/17 0408 06/29/17 0203 06/30/17 0425  AST 40 27 17 18   ALT 16 11* 11* 9*  ALKPHOS 93 90 76 74  BILITOT 1.4* 1.0 0.9 0.7  PROT 7.3 7.5 6.9 7.0  ALBUMIN 3.5 3.4* 3.1* 3.0*   No results for input(s): LIPASE, AMYLASE in the last 168 hours. No results for input(s): AMMONIA in the last 168 hours. Coagulation Profile: No results for input(s): INR, PROTIME in the last 168 hours. Cardiac Enzymes:  Recent Labs Lab 06/28/17 0230 06/28/17 0408 06/28/17 1023 06/28/17 1830 06/29/17 0203  TROPONINI 0.27* 0.37* 0.90* 1.50* 1.43*   BNP (last 3 results) No results for input(s): PROBNP in the last 8760 hours. HbA1C: No results for input(s): HGBA1C in the last 72 hours. CBG:  Recent Labs Lab  06/29/17 1603 06/29/17 1927 06/29/17 2340 06/30/17 0421 06/30/17 0805  GLUCAP 268* 212* 140* 133* 150*   Lipid Profile: No results for input(s): CHOL, HDL, LDLCALC, TRIG, CHOLHDL, LDLDIRECT in the last 72 hours. Thyroid Function Tests: No results for input(s): TSH, T4TOTAL, FREET4, T3FREE, THYROIDAB in the last 72 hours. Anemia Panel: No results for input(s): VITAMINB12, FOLATE, FERRITIN, TIBC, IRON, RETICCTPCT in the last 72 hours. Urine analysis:    Component Value Date/Time   COLORURINE YELLOW 06/28/2017 0735   APPEARANCEUR CLOUDY (A) 06/28/2017 0735   LABSPEC 1.015 06/28/2017 0735   PHURINE 5.0 06/28/2017 0735   GLUCOSEU 150 (A) 06/28/2017 1610  HGBUR SMALL (A) 06/28/2017 0735   BILIRUBINUR NEGATIVE 06/28/2017 0735   KETONESUR NEGATIVE 06/28/2017 0735   PROTEINUR 100 (A) 06/28/2017 0735   NITRITE NEGATIVE 06/28/2017 0735   LEUKOCYTESUR SMALL (A) 06/28/2017 0735   Sepsis Labs: Invalid input(s): PROCALCITONIN, LACTICIDVEN  Recent Results (from the past 240 hour(s))  Culture, blood (routine x 2)     Status: None (Preliminary result)   Collection Time: 06/28/17  6:00 AM  Result Value Ref Range Status   Specimen Description BLOOD RIGHT HAND  Final   Special Requests   Final    BOTTLES DRAWN AEROBIC ONLY Blood Culture adequate volume   Culture NO GROWTH 1 DAY  Final   Report Status PENDING  Incomplete  Culture, blood (routine x 2)     Status: None (Preliminary result)   Collection Time: 06/28/17  6:10 AM  Result Value Ref Range Status   Specimen Description BLOOD LEFT WRIST  Final   Special Requests IN PEDIATRIC BOTTLE Blood Culture adequate volume  Final   Culture NO GROWTH 1 DAY  Final   Report Status PENDING  Incomplete  MRSA PCR Screening     Status: None   Collection Time: 06/28/17  9:02 AM  Result Value Ref Range Status   MRSA by PCR NEGATIVE NEGATIVE Final    Comment:        The GeneXpert MRSA Assay (FDA approved for NASAL specimens only), is one component  of a comprehensive MRSA colonization surveillance program. It is not intended to diagnose MRSA infection nor to guide or monitor treatment for MRSA infections.       Radiology Studies: Nm Pulmonary Perf And Vent  Result Date: 06/30/2017 CLINICAL DATA:  Shortness of breath and chest pain EXAM: NUCLEAR MEDICINE VENTILATION - PERFUSION LUNG SCAN VIEWS: Anterior, posterior, left lateral, right lateral, RPO, LPO, RAO, LAO -ventilation and perfusion RADIOPHARMACEUTICALS:  31.3 mCi Technetium-6283m DTPA aerosol inhalation and 4.2 mCi Technetium-5783m MAA IV COMPARISON:  Chest radiograph June 30, 2017 FINDINGS: Ventilation: There is homogeneous and symmetric radiotracer uptake bilaterally on the ventilation study. No ventilation defects are evident. Perfusion: There is homogeneous and symmetric radiotracer uptake bilaterally on the perfusion study. No perfusion defects are evident. IMPRESSION: No appreciable ventilation or perfusion defects. This study constitutes a very low probability of pulmonary embolus. Electronically Signed   By: Bretta BangWilliam  Woodruff III M.D.   On: 06/30/2017 09:29   Dg Chest Port 1 View  Result Date: 06/30/2017 CLINICAL DATA:  66 year old diabetic hypertensive female with history of CHF. Subsequent encounter. EXAM: PORTABLE CHEST 1 VIEW COMPARISON:  06/27/2017. FINDINGS: Clearing of previously noted patchy bilateral airspace disease. The significant improvement in a short period time suggests that this may have represented pulmonary edema. Mild pulmonary vascular congestion remains. Mild cardiomegaly. Calcified slightly tortuous aorta. No pneumothorax detected. No plain film evidence of pulmonary malignancy. Surgical changes lower cervical spine. Remote right clavicle fracture. IMPRESSION: Clearing of previously noted patchy bilateral airspace disease. The significant improvement in a short period time suggests that this may have represented pulmonary edema. Mild pulmonary vascular  congestion remains. Mild cardiomegaly. Electronically Signed   By: Lacy DuverneySteven  Olson M.D.   On: 06/30/2017 07:15   Scheduled Meds: . furosemide  40 mg Intravenous Q12H  . insulin aspart  0-9 Units Subcutaneous Q4H  . ipratropium-albuterol  3 mL Nebulization TID  . lidocaine  1 patch Transdermal Q24H  . mouth rinse  15 mL Mouth Rinse BID   Continuous Infusions: . aztreonam Stopped (06/30/17 0658)  .  heparin 1,250 Units/hr (06/28/17 2022)    Pamella Pert, MD, PhD [facility] Pager [phone]  If 7PM-7AM, please contact night-coverage [email or web address] Password [facility] 06/30/2017, 10:24 AM

## 2017-06-30 NOTE — Care Management Note (Signed)
Case Management Note  Patient Details  Name: Sarah [name] MRN: [id number] Date of Birth: 06/13/1951  Subjective/Objective:     From home alone, pta indep with cane, she has brother and sisters that live here in [place], and a God sister , Mya that lives in [place],  presents with resp failure ,was on Bipap, for VQ Scan today. She has PCP  And medication coverage.                Action/Plan: NCM will follow for dc needs.   Expected Discharge Date:  06/30/17               Expected Discharge Plan:  Home/Self Care  In-House Referral:     Discharge planning Services  CM Consult  Post Acute Care Choice:    Choice offered to:     DME Arranged:    DME Agency:     HH Arranged:    HH Agency:     Status of Service:  In process, will continue to follow  If discussed at Long Length of Stay Meetings, dates discussed:    Additional Comments:  Leone Havenaylor, Santiel Topper Clinton, RN 06/30/2017, 5:30 PM

## 2017-06-30 NOTE — Progress Notes (Signed)
Progress Note  Patient Name: Sarah [name] Date of Encounter: 06/30/2017  Primary Cardiologist: Anne Fu  Subjective   Breathing much better today.   Inpatient Medications    Scheduled Meds: . furosemide  40 mg Intravenous Q12H  . insulin aspart  0-9 Units Subcutaneous Q4H  . ipratropium-albuterol  3 mL Nebulization TID  . lidocaine  1 patch Transdermal Q24H  . mouth rinse  15 mL Mouth Rinse BID   Continuous Infusions: . heparin 1,250 Units/hr (06/30/17 1000)  . levofloxacin (LEVAQUIN) IV     PRN Meds: acetaminophen **OR** acetaminophen, ondansetron **OR** ondansetron (ZOFRAN) IV, technetium TC 107M diethylenetriame-pentaacetic acid   Vital Signs    Vitals:   06/30/17 0805 06/30/17 0900 06/30/17 0915 06/30/17 1000  BP: (!) 152/57     Pulse: 86 87 85 80  Resp: 16 (!) 22 19 18   Temp: (!) 97.4 F (36.3 C)     TempSrc: Oral     SpO2: 100% 98% 97% 91%  Weight:      Height:        Intake/Output Summary (Last 24 hours) at 06/30/17 1143 Last data filed at 06/30/17 1000  Gross per 24 hour  Intake            462.5 ml  Output             1825 ml  Net          -1362.5 ml   Filed Weights   06/27/17 2149 06/29/17 0817 06/30/17 0418  Weight: 178 lb (80.7 kg) 171 lb (77.6 kg) 155 lb 4.8 oz (70.4 kg)    Telemetry    SR - Personally Reviewed  ECG    SR with PVCs - Personally Reviewed  Physical Exam   General: Well developed, well nourished, older AA female appearing in no acute distress. Head: Normocephalic, atraumatic.  Neck: Supple without bruits, JVD. Lungs:  Resp regular and unlabored, CTA. Heart: RRR, S1, S2, no S3, S4, 2/6 systolic murmur; no rub. Abdomen: Soft, non-tender, non-distended with normoactive bowel sounds. No hepatomegaly. No rebound/guarding. No obvious abdominal masses. Extremities: No clubbing, cyanosis, 1+ bilateral LE edema L>R. Distal pedal pulses are 2+ bilaterally. Neuro: Alert and oriented X 3. Moves all extremities  spontaneously. Psych: Normal affect.  Labs    Chemistry Recent Labs Lab 06/28/17 0408 06/29/17 0203 06/30/17 0425  NA 136 138 138  K 3.9 3.3* 3.6  CL 104 106 104  CO2 18* 22 23  GLUCOSE 243* 212* 143*  BUN 26* 26* 25*  CREATININE 2.44* 2.62* 2.69*  CALCIUM 8.8* 8.7* 8.8*  PROT 7.5 6.9 7.0  ALBUMIN 3.4* 3.1* 3.0*  AST 27 17 18   ALT 11* 11* 9*  ALKPHOS 90 76 74  BILITOT 1.0 0.9 0.7  GFRNONAA 20* 18* 17*  GFRAA 23* 21* 20*  ANIONGAP 14 10 11      Hematology Recent Labs Lab 06/28/17 0408 06/29/17 0203 06/30/17 0425  WBC 11.4* 9.2 6.8  RBC 3.70* 3.24* 3.58*  HGB 9.2* 8.2* 8.9*  HCT 28.7* 24.8* 27.5*  MCV 77.6* 76.5* 76.8*  MCH 24.9* 25.3* 24.9*  MCHC 32.1 33.1 32.4  RDW 14.7 15.1 14.7  PLT 399 301 347    Cardiac Enzymes Recent Labs Lab 06/28/17 0408 06/28/17 1023 06/28/17 1830 06/29/17 0203  TROPONINI 0.37* 0.90* 1.50* 1.43*    Recent Labs Lab 06/27/17 2249  TROPIPOC 0.15*     BNP Recent Labs Lab 06/27/17 2240  BNP 370.9*     DDimer  Recent Labs Lab 06/28/17 0230  DDIMER 4.23*      Radiology    Nm Pulmonary Perf And Vent  Result Date: 06/30/2017 CLINICAL DATA:  Shortness of breath and chest pain EXAM: NUCLEAR MEDICINE VENTILATION - PERFUSION LUNG SCAN VIEWS: Anterior, posterior, left lateral, right lateral, RPO, LPO, RAO, LAO -ventilation and perfusion RADIOPHARMACEUTICALS:  31.3 mCi Technetium-2833m DTPA aerosol inhalation and 4.2 mCi Technetium-3933m MAA IV COMPARISON:  Chest radiograph June 30, 2017 FINDINGS: Ventilation: There is homogeneous and symmetric radiotracer uptake bilaterally on the ventilation study. No ventilation defects are evident. Perfusion: There is homogeneous and symmetric radiotracer uptake bilaterally on the perfusion study. No perfusion defects are evident. IMPRESSION: No appreciable ventilation or perfusion defects. This study constitutes a very low probability of pulmonary embolus. Electronically Signed   By: Bretta BangWilliam   Woodruff III M.D.   On: 06/30/2017 09:29   Dg Chest Port 1 View  Result Date: 06/30/2017 CLINICAL DATA:  66 year old diabetic hypertensive female with history of CHF. Subsequent encounter. EXAM: PORTABLE CHEST 1 VIEW COMPARISON:  06/27/2017. FINDINGS: Clearing of previously noted patchy bilateral airspace disease. The significant improvement in a short period time suggests that this may have represented pulmonary edema. Mild pulmonary vascular congestion remains. Mild cardiomegaly. Calcified slightly tortuous aorta. No pneumothorax detected. No plain film evidence of pulmonary malignancy. Surgical changes lower cervical spine. Remote right clavicle fracture. IMPRESSION: Clearing of previously noted patchy bilateral airspace disease. The significant improvement in a short period time suggests that this may have represented pulmonary edema. Mild pulmonary vascular congestion remains. Mild cardiomegaly. Electronically Signed   By: Lacy DuverneySteven  Olson M.D.   On: 06/30/2017 07:15    Cardiac Studies   TTE: pending  Patient Profile     66 y.o. female with PMH of chronic diastolic HF, CKD stage IV, anemia, DM, HTN. HL, and Stroke who presented with acute respiratory failure and PNA. Found to have an elevated Troponin.   Assessment & Plan    1. Elevated Troponin suspect Demand ischemia: -Mildly elevated troponin with a peak of 1.50, now down trending, likely 2/2 supply demand ischemia in the setting of underlying coronary disease with acute respiratory failure with hypoxia 2/2 likely multifocal PNA, acute on chronic diastolic CHF, anemia of chronic disease, and CKD stage IV - currently on heparin gtt for empiric treatment of possible PE given her hypoxia and recent orthopedic surgery. VQ scan pending - echo pending to assess LV function and WMA. Would benefit from further ischemic evaluation once past this acute illness. Will follow results of echo to determine work up.  - would opt to continue medical therapy  at this time. On home statin, consider the addition of ASA if Hgb remains stable.   2. Acute respiratory failure with hypoxia: -Likely multifactorial including multifocal PNA and acute on chronic diastolic CHF -now off Bipap, and O2.   3. Acute on chronic diastolic CHF: Mild volume overload still present on exam, though weight trending down. Net - 3.3L -IV Lasix with KCl repletion and close monitoring of SCr -TTE pending - BB held on admission 2/2 respiratory failure. No ACEi/ARB in the setting of CKD.  4. Multifocal PNA: -Aztreonam and vancomycin per primary -Nebs per primary   5. Acute on CKD stage IV: Cr stable -Monitor with diuresis   6. Anemia of chronic disease: -Maintain HGB > 8.5  7. HTN: stable   Signed, Laverda PageLindsay Roberts, NP  06/30/2017, 11:43 AM    Agree with note by Laverda PageLindsay Roberts NP-C  Respiratory status improved  on IV antibiotics and diuretics. V/Q scan negative for PE. Can probably discontinue IV heparin. I/O-3.4 L. Serum creatinine trending up to 2.7. Can probably transition from IV to by mouth diuretics. On exam she has trace edema bilaterally. Her lungs are clear and she has no evidence of jugular venous distention. 2-D echo pending for LV function although it has been normal in the past. At this point, I think that her mildly elevated troponin was related to demand ischemia and recommend outpatient functional study.    Runell Gess, M.D., FACP, [facility], [facility] [address] [place], [place]  [address]  [phone] 06/30/2017 12:05 PM

## 2017-07-01 ENCOUNTER — Inpatient Hospital Stay (HOSPITAL_COMMUNITY): Payer: Medicare HMO

## 2017-07-01 DIAGNOSIS — R0902 Hypoxemia: Secondary | ICD-10-CM

## 2017-07-01 DIAGNOSIS — I350 Nonrheumatic aortic (valve) stenosis: Secondary | ICD-10-CM

## 2017-07-01 LAB — GLUCOSE, CAPILLARY
GLUCOSE-CAPILLARY: 172 mg/dL — AB (ref 65–99)
GLUCOSE-CAPILLARY: 209 mg/dL — AB (ref 65–99)
Glucose-Capillary: 122 mg/dL — ABNORMAL HIGH (ref 65–99)
Glucose-Capillary: 146 mg/dL — ABNORMAL HIGH (ref 65–99)
Glucose-Capillary: 211 mg/dL — ABNORMAL HIGH (ref 65–99)

## 2017-07-01 LAB — ECHOCARDIOGRAM COMPLETE
AV Area VTI index: 0.76 cm2/m2
AV Area VTI: 1.24 cm2
AV Mean grad: 12 mmHg
AV Peak grad: 24 mmHg
AV VEL mean LVOT/AV: 0.52
AV pk vel: 244 cm/s
AV vel: 1.44
AVAREAMEANV: 1.33 cm2
AVAREAMEANVIN: 0.7 cm2/m2
Ao pk vel: 0.49 m/s
CHL CUP AV PEAK INDEX: 0.65
CHL CUP DOP CALC LVOT VTI: 28 cm
CHL CUP LVOT MV VTI INDEX: 0.89 cm2/m2
CHL CUP LVOT MV VTI: 1.68
CHL CUP MV DEC (S): 254
DOP CAL AO MEAN VELOCITY: 164 cm/s
EERAT: 16.43
EWDT: 254 ms
FS: 29 % (ref 28–44)
Height: 67 in
IV/PV OW: 0.9
LA diam end sys: 38 mm
LA vol A4C: 49.5 ml
LA vol index: 29.3 mL/m2
LA vol: 55.6 mL
LADIAMINDEX: 2 cm/m2
LASIZE: 38 mm
LDCA: 2.54 cm2
LV E/e' medial: 16.43
LV E/e'average: 16.43
LV PW d: 10.9 mm — AB (ref 0.6–1.1)
LV TDI E'MEDIAL: 5.39
LVELAT: 9.25 cm/s
LVOT peak VTI: 0.57 cm
LVOT peak grad rest: 6 mmHg
LVOTD: 18 mm
LVOTPV: 119 cm/s
LVOTSV: 71 mL
Lateral S' vel: 16.4 cm/s
MV M vel: 96.4
MV pk A vel: 125 m/s
MV pk E vel: 152 m/s
MVANNULUSVTI: 42.3 cm
MVAP: 2.68 cm2
MVPG: 9 mmHg
MVSPHT: 93 ms
Mean grad: 4 mmHg
RV TAPSE: 19.5 mm
TDI e' lateral: 9.25
VTI: 49.3 cm
Valve area index: 0.76
Valve area: 1.44 cm2
WEIGHTICAEL: 2645.52 [oz_av]

## 2017-07-01 LAB — BASIC METABOLIC PANEL
ANION GAP: 12 (ref 5–15)
BUN: 34 mg/dL — ABNORMAL HIGH (ref 6–20)
CHLORIDE: 101 mmol/L (ref 101–111)
CO2: 24 mmol/L (ref 22–32)
Calcium: 9 mg/dL (ref 8.9–10.3)
Creatinine, Ser: 3.04 mg/dL — ABNORMAL HIGH (ref 0.44–1.00)
GFR calc non Af Amer: 15 mL/min — ABNORMAL LOW (ref >60)
GFR, EST AFRICAN AMERICAN: 17 mL/min — AB (ref >60)
GLUCOSE: 224 mg/dL — AB (ref 65–99)
POTASSIUM: 3.6 mmol/L (ref 3.5–5.1)
Sodium: 137 mmol/L (ref 135–145)

## 2017-07-01 LAB — CBC
HCT: 27 % — ABNORMAL LOW (ref 36.0–46.0)
Hemoglobin: 8.9 g/dL — ABNORMAL LOW (ref 12.0–15.0)
MCH: 25 pg — ABNORMAL LOW (ref 26.0–34.0)
MCHC: 33 g/dL (ref 30.0–36.0)
MCV: 75.8 fL — AB (ref 78.0–100.0)
PLATELETS: 370 10*3/uL (ref 150–400)
RBC: 3.56 MIL/uL — AB (ref 3.87–5.11)
RDW: 14.5 % (ref 11.5–15.5)
WBC: 6.9 10*3/uL (ref 4.0–10.5)

## 2017-07-01 MED ORDER — IPRATROPIUM-ALBUTEROL 0.5-2.5 (3) MG/3ML IN SOLN: 3.0000 mL | Freq: Four times a day (QID) | RESPIRATORY_TRACT | Status: DC | PRN

## 2017-07-01 MED ORDER — HEPARIN SODIUM (PORCINE) 5000 UNIT/ML IJ SOLN
5000.0000 [IU] | Freq: Three times a day (TID) | INTRAMUSCULAR | Status: DC
  Administered 2017-07-01 – 2017-07-02 (×4): 5000 [IU] via SUBCUTANEOUS
  Filled 2017-07-01 (×3): qty 1

## 2017-07-01 MED ORDER — CYCLOBENZAPRINE HCL 5 MG PO TABS: 5.0000 mg | ORAL_TABLET | Freq: Three times a day (TID) | ORAL | Status: DC | PRN

## 2017-07-01 NOTE — Plan of Care (Signed)
Problem: Pain Managment: Goal: General experience of comfort will improve Outcome: Progressing Patient complaining of gouty pain in feet- states that this happens to her and that last time she was given "2 pills" but they only gave her 1 this time. Improving but not resolved at this time. Patient did not request pain pills- applied scheduled lidocaine patch.   Problem: Physical Regulation: Goal: Ability to maintain clinical measurements within normal limits will improve Outcome: Progressing Patient remained on room air overnight. No trouble breathing. Cardiology will follow as outpatient.

## 2017-07-01 NOTE — Progress Notes (Signed)
PROGRESS NOTE  Sarah [name]:[id number] DOB: 04-Oct-1951 DOA: 06/27/2017 PCP: Gordy Savers, MD   LOS: 3 days   Brief Narrative / Interim history: 66 year old female with type 2 diabetes mellitus, chronic kidney disease stage IV, anemia, hypertension, hyperlipidemia, who came to the hospital due to increasing shortness of breath over the last 3 days.  She also had a knee replacement about 6 weeks ago.  She was initially placed in stepdown due to respiratory failure requiring BiPAP, and [name] M was consulted.  She did not require intubation, and after diuresis and antibiotics her breathing is improved, and she was transferred to the floor on 7/17.  Due to elevated troponin as well as reported chest pains at home, cardiology was consulted.  Assessment & Plan: Principal Problem:   Acute respiratory failure (HCC) Active Problems:   Diabetes mellitus with renal complications (HCC)   Essential hypertension   Acute on chronic diastolic CHF (congestive heart failure) (HCC)   HCAP (healthcare-associated pneumonia)   Acute respiratory failure with hypoxia (HCC)   Pulmonary edema   Cardiac enzymes elevated   Acute hypoxic respiratory failure -patient was admitted to SDU given BiPAP requirement on admission. PCCM was consulted. With diuresis and antibiotics her respiratory status has improved and has now been weaned off to room air. Transfer to floor today.  -Chest x-ray on admission patchy opacities in the mid and upper lung zones suggesting multifocal pneumonia versus developing edema. -PE was also hard of differential, given elevated d-dimer she was started on empiric heparin, however heparin was discontinued yesterday due to negative VQ scan  CHF -? BNP was elevated at 370 -Continue diuresis, blood pressure is stable -She is net -4.5 L. Lower extremity swelling is improved -2D echo is pending -Cardiology consulted on 7/15, appreciate input  Multifocal pneumonia -initially started  on broad spectrum antibiotics with Vancomyin and Aztreonam, narrowed to Levaquin yesterday. Clinically improving. Cultures have remained negative. Plan for 7 days total antibiotics, today day #4  Status post left knee replacement -Surgical site looks good, no erythema, no tenderness, minimal pain  Gouty attack -Patient reports bilateral toe pain yesterday, started on colchicine which is renally dosed. -Improving today  Type 2 diabetes mellitus -Continue sliding scale insulin, CBGs 120s-140s  Hypertension -IV hydralazine as needed, blood pressure into the 130s today  Chronic kidney disease stage IV -Creatinine between 2 and 3 at baseline, currently at baseline and appears to remain stable with diuresis -BMP this morning pending  Anemia of chronic kidney disease -Hemoglobin appears at baseline   DVT prophylaxis: Heparin subcutaneous Code Status: Full code Family Communication: no family at bedside Disposition Plan: Transfer to telemetry  Consultants:   PCCM  Cardiology   Procedures:   2D echo: pending  Antimicrobials:  Vancomycin 7/14 >> 7/16  Aztreonam 7/14 >> 7/16  Levofloxacin 7/16 >>  Subjective: -No complaints this morning, feeling good, no shortness of breath, she is on room air.  Denies any chest pain, no abdominal pain, no nausea or vomiting.  Objective: Vitals:   07/01/17 0753 07/01/17 0800 07/01/17 1000 07/01/17 1102  BP: (!) 109/96 (!) 109/96 (!) 132/46 107/62  Pulse: 86 75 81 89  Resp: (!) 24 (!) 21 20 14   Temp: 98.7 F (37.1 C)   97.6 F (36.4 C)  TempSrc: Oral   Oral  SpO2: 98% 100% 95% 94%  Weight:      Height:        Intake/Output Summary (Last 24 hours) at 07/01/17 1136 Last data  filed at 07/01/17 1102  Gross per 24 hour  Intake           986.88 ml  Output             1850 ml  Net          -863.12 ml   Filed Weights   06/29/17 0817 06/30/17 0418 07/01/17 0351  Weight: 77.6 kg (171 lb) 70.4 kg (155 lb 4.8 oz) 75 kg (165 lb  5.5 oz)    Examination:  Vitals:   07/01/17 0753 07/01/17 0800 07/01/17 1000 07/01/17 1102  BP: (!) 109/96 (!) 109/96 (!) 132/46 107/62  Pulse: 86 75 81 89  Resp: (!) 24 (!) 21 20 14   Temp: 98.7 F (37.1 C)   97.6 F (36.4 C)  TempSrc: Oral   Oral  SpO2: 98% 100% 95% 94%  Weight:      Height:        Constitutional: No apparent distress, sleeping when I entered the room Eyes: Lids and conjunctivae normal Respiratory: No wheezing, no crackles, clear to auscultation bilaterally, normal respiratory effort, moves air well on both lung fields Cardiovascular: Regular rate and rhythm, no murmurs rubs or gallops.  Lower extremity edema almost resolved.  2+ pulses Abdomen: No tenderness, bowel sounds positive Skin: No rashes Neurologic: No focal findings, strength equal   Data Reviewed: I have independently reviewed following labs and imaging studies   CBC:  Recent Labs Lab 06/27/17 2240 06/28/17 0230 06/28/17 0408 06/29/17 0203 06/30/17 0425 07/01/17 0508  WBC 10.7* 11.7* 11.4* 9.2 6.8 6.9  NEUTROABS 8.4*  --   --   --   --   --   HGB 10.1* 9.4* 9.2* 8.2* 8.9* 8.9*  HCT 30.5* 29.1* 28.7* 24.8* 27.5* 27.0*  MCV 77.0* 78.6 77.6* 76.5* 76.8* 75.8*  PLT 451* 364 399 301 347 370   Basic Metabolic Panel:  Recent Labs Lab 06/27/17 2240 06/28/17 0230 06/28/17 0408 06/29/17 0203 06/30/17 0425  NA 136  --  136 138 138  K 4.9  --  3.9 3.3* 3.6  CL 102  --  104 106 104  CO2 21*  --  18* 22 23  GLUCOSE 226*  --  243* 212* 143*  BUN 25*  --  26* 26* 25*  CREATININE 2.35* 2.56* 2.44* 2.62* 2.69*  CALCIUM 8.7*  --  8.8* 8.7* 8.8*   GFR: Estimated Creatinine Clearance: 21.8 mL/min (A) (by C-G formula based on SCr of 2.69 mg/dL (H)). Liver Function Tests:  Recent Labs Lab 06/27/17 2240 06/28/17 0408 06/29/17 0203 06/30/17 0425  AST 40 27 17 18   ALT 16 11* 11* 9*  ALKPHOS 93 90 76 74  BILITOT 1.4* 1.0 0.9 0.7  PROT 7.3 7.5 6.9 7.0  ALBUMIN 3.5 3.4* 3.1* 3.0*   No  results for input(s): LIPASE, AMYLASE in the last 168 hours. No results for input(s): AMMONIA in the last 168 hours. Coagulation Profile: No results for input(s): INR, PROTIME in the last 168 hours. Cardiac Enzymes:  Recent Labs Lab 06/28/17 0230 06/28/17 0408 06/28/17 1023 06/28/17 1830 06/29/17 0203  TROPONINI 0.27* 0.37* 0.90* 1.50* 1.43*   BNP (last 3 results) No results for input(s): PROBNP in the last 8760 hours. HbA1C: No results for input(s): HGBA1C in the last 72 hours. CBG:  Recent Labs Lab 06/30/17 1636 06/30/17 2011 06/30/17 2318 07/01/17 0358 07/01/17 0803  GLUCAP 290* 130* 149* 122* 146*   Lipid Profile: No results for input(s): CHOL, HDL, LDLCALC, TRIG, CHOLHDL,  LDLDIRECT in the last 72 hours. Thyroid Function Tests: No results for input(s): TSH, T4TOTAL, FREET4, T3FREE, THYROIDAB in the last 72 hours. Anemia Panel: No results for input(s): VITAMINB12, FOLATE, FERRITIN, TIBC, IRON, RETICCTPCT in the last 72 hours. Urine analysis:    Component Value Date/Time   COLORURINE YELLOW 06/28/2017 0735   APPEARANCEUR CLOUDY (A) 06/28/2017 0735   LABSPEC 1.015 06/28/2017 0735   PHURINE 5.0 06/28/2017 0735   GLUCOSEU 150 (A) 06/28/2017 0735   HGBUR SMALL (A) 06/28/2017 0735   BILIRUBINUR NEGATIVE 06/28/2017 0735   KETONESUR NEGATIVE 06/28/2017 0735   PROTEINUR 100 (A) 06/28/2017 0735   NITRITE NEGATIVE 06/28/2017 0735   LEUKOCYTESUR SMALL (A) 06/28/2017 0735   Sepsis Labs: Invalid input(s): PROCALCITONIN, LACTICIDVEN  Recent Results (from the past 240 hour(s))  Culture, blood (routine x 2)     Status: None (Preliminary result)   Collection Time: 06/28/17  6:00 AM  Result Value Ref Range Status   Specimen Description BLOOD RIGHT HAND  Final   Special Requests   Final    BOTTLES DRAWN AEROBIC ONLY Blood Culture adequate volume   Culture NO GROWTH 2 DAYS  Final   Report Status PENDING  Incomplete  Culture, blood (routine x 2)     Status: None  (Preliminary result)   Collection Time: 06/28/17  6:10 AM  Result Value Ref Range Status   Specimen Description BLOOD LEFT WRIST  Final   Special Requests IN PEDIATRIC BOTTLE Blood Culture adequate volume  Final   Culture NO GROWTH 2 DAYS  Final   Report Status PENDING  Incomplete  MRSA PCR Screening     Status: None   Collection Time: 06/28/17  9:02 AM  Result Value Ref Range Status   MRSA by PCR NEGATIVE NEGATIVE Final    Comment:        The GeneXpert MRSA Assay (FDA approved for NASAL specimens only), is one component of a comprehensive MRSA colonization surveillance program. It is not intended to diagnose MRSA infection nor to guide or monitor treatment for MRSA infections.     Radiology Studies: Nm Pulmonary Perf And Vent  Result Date: 06/30/2017 CLINICAL DATA:  Shortness of breath and chest pain EXAM: NUCLEAR MEDICINE VENTILATION - PERFUSION LUNG SCAN VIEWS: Anterior, posterior, left lateral, right lateral, RPO, LPO, RAO, LAO -ventilation and perfusion RADIOPHARMACEUTICALS:  31.3 mCi Technetium-51m DTPA aerosol inhalation and 4.2 mCi Technetium-76m MAA IV COMPARISON:  Chest radiograph June 30, 2017 FINDINGS: Ventilation: There is homogeneous and symmetric radiotracer uptake bilaterally on the ventilation study. No ventilation defects are evident. Perfusion: There is homogeneous and symmetric radiotracer uptake bilaterally on the perfusion study. No perfusion defects are evident. IMPRESSION: No appreciable ventilation or perfusion defects. This study constitutes a very low probability of pulmonary embolus. Electronically Signed   By: Bretta Bang III M.D.   On: 06/30/2017 09:29   Dg Chest Port 1 View  Result Date: 06/30/2017 CLINICAL DATA:  66 year old diabetic hypertensive female with history of CHF. Subsequent encounter. EXAM: PORTABLE CHEST 1 VIEW COMPARISON:  06/27/2017. FINDINGS: Clearing of previously noted patchy bilateral airspace disease. The significant improvement  in a short period time suggests that this may have represented pulmonary edema. Mild pulmonary vascular congestion remains. Mild cardiomegaly. Calcified slightly tortuous aorta. No pneumothorax detected. No plain film evidence of pulmonary malignancy. Surgical changes lower cervical spine. Remote right clavicle fracture. IMPRESSION: Clearing of previously noted patchy bilateral airspace disease. The significant improvement in a short period time suggests that this may have represented  pulmonary edema. Mild pulmonary vascular congestion remains. Mild cardiomegaly. Electronically Signed   By: Lacy DuverneySteven  Olson M.D.   On: 06/30/2017 07:15   Scheduled Meds: . colchicine  0.3 mg Oral Daily  . furosemide  40 mg Intravenous Q12H  . heparin subcutaneous  5,000 Units Subcutaneous Q8H  . insulin aspart  0-9 Units Subcutaneous Q4H  . lidocaine  1 patch Transdermal Q24H   Continuous Infusions: . levofloxacin (LEVAQUIN) IV Stopped (06/30/17 1616)    Pamella Pertostin Gherghe, MD, PhD [facility] Pager [phone]  If 7PM-7AM, please contact night-coverage [email or web address] Password [email or web address] 07/01/2017, 11:36 AM

## 2017-07-01 NOTE — Progress Notes (Signed)
    Echo today noted normal EF with no WMA. Arrange for outpatient follow up with stress once ready for discharge.   SignedLaverda Page, Lindsay Roberts, NP-C 07/01/2017, 3:19 PM Pager: [phone]

## 2017-07-01 NOTE — Progress Notes (Signed)
  Echocardiogram 2D Echocardiogram has been performed.  Sarah [name], Sarah [name] 07/01/2017, 2:23 PM

## 2017-07-02 ENCOUNTER — Other Ambulatory Visit: Payer: Self-pay | Admitting: Internal Medicine

## 2017-07-02 DIAGNOSIS — J189 Pneumonia, unspecified organism: Secondary | ICD-10-CM

## 2017-07-02 DIAGNOSIS — I5033 Acute on chronic diastolic (congestive) heart failure: Secondary | ICD-10-CM

## 2017-07-02 DIAGNOSIS — R748 Abnormal levels of other serum enzymes: Secondary | ICD-10-CM

## 2017-07-02 LAB — COMPREHENSIVE METABOLIC PANEL
ALBUMIN: 2.9 g/dL — AB (ref 3.5–5.0)
ALT: 11 U/L — AB (ref 14–54)
AST: 16 U/L (ref 15–41)
Alkaline Phosphatase: 72 U/L (ref 38–126)
Anion gap: 12 (ref 5–15)
BILIRUBIN TOTAL: 0.9 mg/dL (ref 0.3–1.2)
BUN: 37 mg/dL — ABNORMAL HIGH (ref 6–20)
CHLORIDE: 103 mmol/L (ref 101–111)
CO2: 22 mmol/L (ref 22–32)
CREATININE: 3.09 mg/dL — AB (ref 0.44–1.00)
Calcium: 8.7 mg/dL — ABNORMAL LOW (ref 8.9–10.3)
GFR calc Af Amer: 17 mL/min — ABNORMAL LOW (ref >60)
GFR, EST NON AFRICAN AMERICAN: 15 mL/min — AB (ref >60)
GLUCOSE: 172 mg/dL — AB (ref 65–99)
POTASSIUM: 3.4 mmol/L — AB (ref 3.5–5.1)
Sodium: 137 mmol/L (ref 135–145)
TOTAL PROTEIN: 7.1 g/dL (ref 6.5–8.1)

## 2017-07-02 LAB — GLUCOSE, CAPILLARY
GLUCOSE-CAPILLARY: 118 mg/dL — AB (ref 65–99)
Glucose-Capillary: 161 mg/dL — ABNORMAL HIGH (ref 65–99)
Glucose-Capillary: 263 mg/dL — ABNORMAL HIGH (ref 65–99)

## 2017-07-02 LAB — CBC
HEMATOCRIT: 27.7 % — AB (ref 36.0–46.0)
Hemoglobin: 9.2 g/dL — ABNORMAL LOW (ref 12.0–15.0)
MCH: 25.3 pg — ABNORMAL LOW (ref 26.0–34.0)
MCHC: 33.2 g/dL (ref 30.0–36.0)
MCV: 76.1 fL — AB (ref 78.0–100.0)
Platelets: 351 10*3/uL (ref 150–400)
RBC: 3.64 MIL/uL — AB (ref 3.87–5.11)
RDW: 15 % (ref 11.5–15.5)
WBC: 6.7 10*3/uL (ref 4.0–10.5)

## 2017-07-02 LAB — PROCALCITONIN: PROCALCITONIN: 0.22 ng/mL

## 2017-07-02 MED ORDER — POTASSIUM CHLORIDE ER 10 MEQ PO TBCR: 20.0000 meq | EXTENDED_RELEASE_TABLET | Freq: Every day | ORAL | 0 refills | Status: DC

## 2017-07-02 MED ORDER — COLCHICINE 0.6 MG PO TABS: 0.3000 mg | ORAL_TABLET | Freq: Every day | ORAL | Status: DC | PRN

## 2017-07-02 MED ORDER — LEVOFLOXACIN 750 MG PO TABS: 750.0000 mg | ORAL_TABLET | ORAL | 0 refills | Status: AC

## 2017-07-02 MED ORDER — FUROSEMIDE 40 MG PO TABS: 40.0000 mg | ORAL_TABLET | Freq: Every day | ORAL | 0 refills | Status: AC

## 2017-07-02 MED ORDER — ALLOPURINOL 100 MG PO TABS: 100.0000 mg | ORAL_TABLET | Freq: Every day | ORAL | 0 refills | Status: AC

## 2017-07-02 MED ORDER — COLCRYS 0.6 MG PO TABS: 0.3000 mg | ORAL_TABLET | Freq: Every day | ORAL | 5 refills | Status: DC

## 2017-07-02 MED ORDER — LEVOFLOXACIN 750 MG PO TABS
750.0000 mg | ORAL_TABLET | ORAL | Status: DC
  Administered 2017-07-02: 750 mg via ORAL
  Filled 2017-07-02: qty 1

## 2017-07-02 MED ORDER — LEVOFLOXACIN 500 MG PO TABS: 500.0000 mg | ORAL_TABLET | ORAL | Status: DC

## 2017-07-02 NOTE — Progress Notes (Signed)
Pharmacy Antibiotic Note  Sarah [name] is a [age] YOF s/p recent L-TKA on 05/16/17 who presented on 7/14 with SOB concerning for PNA vs PE. Pharmacy has been consulted for Levaquin dosing.  Renal function continues to decline, Scr 2.64>3.09 and dose of levaquin needs to be reduced. Levaquin was switched from IV to PO 7/17.  Plan: 1. Decrease Levaquin to 500 mg PO every 48 hours 2. Will continue to follow renal function, culture results, LOT, and antibiotic de-escalation plans   Height: 5\' 4"  (162.6 cm) Weight: 164 lb 0.4 oz (74.4 kg) IBW/kg (Calculated) : 54.7  Temp (24hrs), Avg:98.2 F (36.8 C), Min:97.7 F (36.5 C), Max:98.8 F (37.1 C)   Recent Labs Lab 06/28/17 0230 06/28/17 0406 06/28/17 0408 06/28/17 0601 06/28/17 1023 06/28/17 2102 06/29/17 0203 06/30/17 0425 07/01/17 0508 07/01/17 1216 07/02/17 0432  WBC 11.7*  --  11.4*  --   --   --  9.2 6.8 6.9  --  6.7  CREATININE 2.56*  --  2.44*  --   --   --  2.62* 2.69*  --  3.04* 3.09*  LATICACIDVEN 5.4* 3.8*  --  3.6* 2.4* 3.1*  --   --   --   --   --     Estimated Creatinine Clearance: 17.7 mL/min (A) (by C-G formula based on SCr of 3.09 mg/dL (H)).    Allergies  Allergen Reactions  . Metoprolol Swelling    SWELLING REACTION UNSPECIFIED   . Cefaclor Hives  . Tetanus Toxoid Adsorbed Swelling    SWELLING REACTION UNSPECIFIED     Antimicrobials this admission: Vanc 7/13>>7/15 Aztreonam 7/13>> 7/16 LVQ 7/13 x1; restart 7/16  Dose adjustments this admission: Levaquin IV to PO and decreased from 750mg  to 500mg  q48h.  Microbiology results: 7/14 BCx: ngtd 7/14 Sputum: sent 7/14 MRSA PCR: negative  Thank you for allowing pharmacy to be a part of this patient's care.  Donnella Biyler [name], PharmD PGY1 Acute Care Pharmacy Resident Pager: [phone] 07/02/2017 2:49 PM

## 2017-07-02 NOTE — Progress Notes (Signed)
Pt discharge via wheelchair with nurse staff  Payden Docter Elige RadonBradley

## 2017-07-02 NOTE — Consult Note (Addendum)
   [facility] CM Inpatient Consult   07/02/2017  Sarah [name] [birth year] [id number]   Patient assessed for post hospital care management needs in the [facility]. Patient is a 66 y/o with a Hx of DM2, Stage IV kidney disease and admitted for possible CPAP with shortness of breath.  Met with the patient at the bedside.  Patient states she is doing pretty well and was in outpatient rehab for her knee and stating a HX of stroke in the past as well.  Patient denies any problems with medications and states she calls her primary care provider, Dr. Bluford Kaufmann and he takes care of any refills or problems with [place].  She denies issues with transportation and states she drives to her rehab without problems.  Patient accepted a brochure with contact information.  Noted that [facility] provides the transition of care follow up for their patients.  Will follow with EMMI PNA calls, sheet given and explained.  Patient encouraged to call if she has any care management needs.  For questions, please contact:  Natividad Brood, RN BSN [facility] Liaison  [phone] business mobile phone Toll free office [phone]

## 2017-07-02 NOTE — Discharge Summary (Signed)
Physician Discharge Summary  Sarah [name] HGD:[id number] DOB: 06/12/1951 DOA: 06/27/2017  PCP: Marletta Lor, MD  Admit date: 06/27/2017 Discharge date: 07/02/2017  Admitted From:home Disposition:home  Recommendations for Outpatient Follow-up:  1. Follow up with PCP in 1-2 weeks 2. Please obtain BMP/CBC in one week 3. Follow-up with the cardiologist for stress test.  Home Health:no Equipment/Devices:no Discharge Condition:stable CODE STATUS:full code Diet recommendation:carb modified heart healthy diet.  Brief/Interim Summary: 66 year old female with history of type 2 diabetes, chronic kidney disease is stage IV, anemia, hypertension, hyperlipidemia presented with shortness of breath for about 3 days. Patient had knee replacement surgery about 6 weeks prior to admission. Patient initially required BiPAP and evaluated by the pulmonary critical care service.  Acute respiratory failure with hypoxia likely in the setting of possible CHF and multifocal pneumonia. Hypoxemia improved currently in room air. VQ scan low probability for PE.  Multifocal pneumonia: Discharged with oral Levaquin renally dosed for 5 days.  Acute on chronic diastolic congestive heart failure: Treated with IV Lasix and switched to oral Lasix on discharge. Recommended to follow up with the cardiologist for outpatient stress test. Message sent to cardiology consult team. Recommended low-salt diet and follow up with PCP or cardiologist to repeat lab check. -Repeat echo with normal EF with no wall motion abnormalities.  Acute kidney injury on chronic kidney disease stage IV likely hemodynamically mediated in the setting of diuretics. Serum creatinine level is stable but elevated. Patient is clinically improved. Denied urinary symptoms. Recommended to follow-up with her cardiologist and nephrologist. Patient verbalized understanding.  Anemia of chronic disease: Hemoglobin is stable.  Hypertension: Monitor blood  pressure.  Discharge Diagnoses:  Principal Problem:   Acute respiratory failure ([place]) Active Problems:   Diabetes mellitus with renal complications (HCC)   Essential hypertension   Acute on chronic diastolic CHF (congestive heart failure) ([place])   HCAP (healthcare-associated pneumonia)   Acute respiratory failure with hypoxia (HCC)   Pulmonary edema   Cardiac enzymes elevated    Discharge Instructions  Discharge Instructions    (HEART FAILURE PATIENTS) Call MD:  Anytime you have any of the following symptoms: 1) 3 pound weight gain in 24 hours or 5 pounds in 1 week 2) shortness of breath, with or without a dry hacking cough 3) swelling in the hands, feet or stomach 4) if you have to sleep on extra pillows at night in order to breathe.    Complete by:  As directed    Call MD for:  difficulty breathing, headache or visual disturbances    Complete by:  As directed    Call MD for:  extreme fatigue    Complete by:  As directed    Call MD for:  hives    Complete by:  As directed    Call MD for:  persistant dizziness or light-headedness    Complete by:  As directed    Call MD for:  persistant nausea and vomiting    Complete by:  As directed    Call MD for:  severe uncontrolled pain    Complete by:  As directed    Call MD for:  temperature >100.4    Complete by:  As directed    Diet - low sodium heart healthy    Complete by:  As directed    Diet Carb Modified    Complete by:  As directed    Discharge instructions    Complete by:  As directed    Please follow up with  your cardiologist for outpatient cardiac stress test.   Increase activity slowly    Complete by:  As directed      Allergies as of 07/02/2017      Reactions   Metoprolol Swelling   SWELLING REACTION UNSPECIFIED    Cefaclor Hives   Tetanus Toxoid Adsorbed Swelling   SWELLING REACTION UNSPECIFIED       Medication List    STOP taking these medications   LORazepam 0.5 MG tablet Commonly known as:  ATIVAN      TAKE these medications   ACCU-CHEK FASTCLIX LANCETS Misc USE TO CHECK BLOOD SUGAR THREE TIMES A DAY BEFORE MEALS AND AT BEDTIME   ACCU-CHEK NANO SMARTVIEW w/Device Kit USE AS DIRECTED   ACCU-CHEK SMARTVIEW test strip Generic drug:  glucose blood USE TO CHECK BLOOD SUGAR THREE TIMES A DAY BEFORE MEALS AND AT BEDTIME   allopurinol 100 MG tablet Commonly known as:  ZYLOPRIM Take 1 tablet (100 mg total) by mouth daily. What changed:  medication strength  how much to take   amLODipine 10 MG tablet Commonly known as:  NORVASC Take 1 tablet (10 mg total) by mouth daily.   aspirin EC 325 MG tablet Take 1 tablet (325 mg total) by mouth 2 (two) times daily after a meal. Take x 1 month post op to decrease risk of blood clots.   atorvastatin 80 MG tablet Commonly known as:  LIPITOR TAKE 1 TABLET (80 MG TOTAL) BY MOUTH DAILY.   COLCRYS 0.6 MG tablet Generic drug:  colchicine Take 0.5 tablets (0.3 mg total) by mouth daily. What changed:  how much to take   cyclobenzaprine 5 MG tablet Commonly known as:  FLEXERIL Take 1 tablet (5 mg total) by mouth 3 (three) times daily as needed for muscle spasms.   furosemide 40 MG tablet Commonly known as:  LASIX Take 1 tablet (40 mg total) by mouth daily. What changed:  medication strength  how much to take   HYDROcodone-acetaminophen 10-325 MG tablet Commonly known as:  NORCO Take 1 tablet by mouth every 8 (eight) hours as needed. What changed:  reasons to take this   insulin aspart 100 UNIT/ML FlexPen Commonly known as:  NOVOLOG FLEXPEN 20 units prior to each meal   insulin glargine 100 UNIT/ML injection Commonly known as:  LANTUS 10 units at bedtime   Insulin Pen Needle 30G X 8 MM Misc Commonly known as:  NOVOFINE Inject 10 each into the skin as needed.   KLOR-CON/EF 25 MEQ disintegrating tablet Generic drug:  potassium bicarbonate Take 25 mEq by mouth daily. In 6 oz of water   levofloxacin 750 MG tablet Commonly known  as:  LEVAQUIN Take 1 tablet (750 mg total) by mouth every other day.   lidocaine 5 % Commonly known as:  LIDODERM Place 1 patch onto the skin daily. Remove & Discard patch within 12 hours or as directed by MD   potassium chloride 10 MEQ tablet Commonly known as:  K-DUR Take 2 tablets (20 mEq total) by mouth daily.      Follow-up Information    Marletta Lor, MD. Schedule an appointment as soon as possible for a visit in 1 week(s).   Specialty:  Internal Medicine Contact information: [place] [address] [phone]        Jerline Pain, MD. Schedule an appointment as soon as possible for a visit in 2 week(s).   Specialty:  Cardiology Contact information: [address]  [place] [address] [phone]          Allergies  Allergen Reactions  . Metoprolol Swelling    SWELLING REACTION UNSPECIFIED   . Cefaclor Hives  . Tetanus Toxoid Adsorbed Swelling    SWELLING REACTION UNSPECIFIED     Consultations: Pulmonary critical care Cardiologist  Procedures/Studies: Echocardiogram  Subjective: Seen and examined at bedside. Reported doing well. Denied headache, tinnitus, nausea, vomiting, chest pain, shortness of breath, dysuria or urgency.  Discharge Exam: Vitals:   07/02/17 0734 07/02/17 1154  BP: (!) 133/53 140/60  Pulse: 73 74  Resp:    Temp: 98.1 F (36.7 C) 97.9 F (36.6 C)   Vitals:   07/02/17 0115 07/02/17 0556 07/02/17 0734 07/02/17 1154  BP: (!) 157/62 (!) 159/57 (!) 133/53 140/60  Pulse: 75 78 73 74  Resp: 16 18    Temp: 98 F (36.7 C) 98.3 F (36.8 C) 98.1 F (36.7 C) 97.9 F (36.6 C)  TempSrc: Oral Oral Oral Oral  SpO2: 95% 97% 97% 98%  Weight: 74.4 kg (164 lb 0.4 oz)     Height: '5\' 4"'  (1.626 m)       General: Pt is alert, awake, not in acute distress Cardiovascular: RRR, S1/S2 +, no rubs, no gallops Respiratory: CTA bilaterally, no wheezing, no rhonchi Abdominal: Soft, NT, ND,  bowel sounds + Extremities: no edema, no cyanosis    The results of significant diagnostics from this hospitalization (including imaging, microbiology, ancillary and laboratory) are listed below for reference.     Microbiology: Recent Results (from the past 240 hour(s))  Culture, blood (routine x 2)     Status: None (Preliminary result)   Collection Time: 06/28/17  6:00 AM  Result Value Ref Range Status   Specimen Description BLOOD RIGHT HAND  Final   Special Requests   Final    BOTTLES DRAWN AEROBIC ONLY Blood Culture adequate volume   Culture NO GROWTH 3 DAYS  Final   Report Status PENDING  Incomplete  Culture, blood (routine x 2)     Status: None (Preliminary result)   Collection Time: 06/28/17  6:10 AM  Result Value Ref Range Status   Specimen Description BLOOD LEFT WRIST  Final   Special Requests IN PEDIATRIC BOTTLE Blood Culture adequate volume  Final   Culture NO GROWTH 3 DAYS  Final   Report Status PENDING  Incomplete  MRSA PCR Screening     Status: None   Collection Time: 06/28/17  9:02 AM  Result Value Ref Range Status   MRSA by PCR NEGATIVE NEGATIVE Final    Comment:        The GeneXpert MRSA Assay (FDA approved for NASAL specimens only), is one component of a comprehensive MRSA colonization surveillance program. It is not intended to diagnose MRSA infection nor to guide or monitor treatment for MRSA infections.      Labs: BNP (last 3 results)  Recent Labs  06/27/17 2240  BNP 287.8*   Basic Metabolic Panel:  Recent Labs Lab 06/28/17 0408 06/29/17 0203 06/30/17 0425 07/01/17 1216 07/02/17 0432  NA 136 138 138 137 137  K 3.9 3.3* 3.6 3.6 3.4*  CL 104 106 104 101 103  CO2 18* '22 23 24 22  ' GLUCOSE 243* 212* 143* 224* 172*  BUN 26* 26* 25* 34* 37*  CREATININE 2.44* 2.62* 2.69* 3.04* 3.09*  CALCIUM 8.8* 8.7* 8.8* 9.0 8.7*   Liver Function Tests:  Recent Labs Lab 06/27/17 2240 06/28/17 0408 06/29/17 0203 06/30/17 0425 07/02/17 6767  AST  40 '27 17 18 16  ' ALT 16 11* 11* 9* 11*  ALKPHOS 93 90 76 74 72  BILITOT 1.4* 1.0 0.9 0.7 0.9  PROT 7.3 7.5 6.9 7.0 7.1  ALBUMIN 3.5 3.4* 3.1* 3.0* 2.9*   No results for input(s): LIPASE, AMYLASE in the last 168 hours. No results for input(s): AMMONIA in the last 168 hours. CBC:  Recent Labs Lab 06/27/17 2240  06/28/17 0408 06/29/17 0203 06/30/17 0425 07/01/17 0508 07/02/17 0432  WBC 10.7*  < > 11.4* 9.2 6.8 6.9 6.7  NEUTROABS 8.4*  --   --   --   --   --   --   HGB 10.1*  < > 9.2* 8.2* 8.9* 8.9* 9.2*  HCT 30.5*  < > 28.7* 24.8* 27.5* 27.0* 27.7*  MCV 77.0*  < > 77.6* 76.5* 76.8* 75.8* 76.1*  PLT 451*  < > 399 301 347 370 351  < > = values in this interval not displayed. Cardiac Enzymes:  Recent Labs Lab 06/28/17 0230 06/28/17 0408 06/28/17 1023 06/28/17 1830 06/29/17 0203  TROPONINI 0.27* 0.37* 0.90* 1.50* 1.43*   BNP: Invalid input(s): POCBNP CBG:  Recent Labs Lab 07/01/17 1642 07/01/17 2036 07/01/17 2345 07/02/17 0733 07/02/17 1152  GLUCAP 172* 209* 118* 161* 263*   D-Dimer No results for input(s): DDIMER in the last 72 hours. Hgb A1c No results for input(s): HGBA1C in the last 72 hours. Lipid Profile No results for input(s): CHOL, HDL, LDLCALC, TRIG, CHOLHDL, LDLDIRECT in the last 72 hours. Thyroid function studies No results for input(s): TSH, T4TOTAL, T3FREE, THYROIDAB in the last 72 hours.  Invalid input(s): FREET3 Anemia work up No results for input(s): VITAMINB12, FOLATE, FERRITIN, TIBC, IRON, RETICCTPCT in the last 72 hours. Urinalysis    Component Value Date/Time   COLORURINE YELLOW 06/28/2017 0735   APPEARANCEUR CLOUDY (A) 06/28/2017 0735   LABSPEC 1.015 06/28/2017 0735   PHURINE 5.0 06/28/2017 0735   GLUCOSEU 150 (A) 06/28/2017 0735   HGBUR SMALL (A) 06/28/2017 0735   BILIRUBINUR NEGATIVE 06/28/2017 0735   KETONESUR NEGATIVE 06/28/2017 0735   PROTEINUR 100 (A) 06/28/2017 0735   NITRITE NEGATIVE 06/28/2017 0735   LEUKOCYTESUR SMALL  (A) 06/28/2017 0735   Sepsis Labs Invalid input(s): PROCALCITONIN,  WBC,  LACTICIDVEN Microbiology Recent Results (from the past 240 hour(s))  Culture, blood (routine x 2)     Status: None (Preliminary result)   Collection Time: 06/28/17  6:00 AM  Result Value Ref Range Status   Specimen Description BLOOD RIGHT HAND  Final   Special Requests   Final    BOTTLES DRAWN AEROBIC ONLY Blood Culture adequate volume   Culture NO GROWTH 3 DAYS  Final   Report Status PENDING  Incomplete  Culture, blood (routine x 2)     Status: None (Preliminary result)   Collection Time: 06/28/17  6:10 AM  Result Value Ref Range Status   Specimen Description BLOOD LEFT WRIST  Final   Special Requests IN PEDIATRIC BOTTLE Blood Culture adequate volume  Final   Culture NO GROWTH 3 DAYS  Final   Report Status PENDING  Incomplete  MRSA PCR Screening     Status: None   Collection Time: 06/28/17  9:02 AM  Result Value Ref Range Status   MRSA by PCR NEGATIVE NEGATIVE Final    Comment:        The GeneXpert MRSA Assay (FDA approved for NASAL specimens only), is one component of a comprehensive MRSA colonization surveillance program. It is  not intended to diagnose MRSA infection nor to guide or monitor treatment for MRSA infections.      Time coordinating discharge: 32 minutes  SIGNED:   Rosita Fire, MD  [facility] 07/02/2017, 12:37 PM  If 7PM-7AM, please contact night-coverage [email or web address] Password [email or web address]

## 2017-07-02 NOTE — Progress Notes (Signed)
NURSING PROGRESS NOTE  Sarah [name] Edelman [id number] Admission Data: 07/02/2017 3:37 AM Attending Provider: Leatha GildingGherghe, Costin M, MD AOZ:[id number]:Kwiatkowski, Janett LabellaPeter F, MD Code Status: full  Sarah [name] Berwanger is a 66 y.o. female patient admitted from ED:  -No acute distress noted.  -No complaints of shortness of breath.  -No complaints of chest pain.   Cardiac Monitoring: Box # 37 in place. Cardiac monitor yields:normal sinus rhythm.  Blood pressure (!) 157/62, pulse 75, temperature 98 F (36.7 C), temperature source Oral, resp. rate 16, height 5\' 4"  (1.626 m), weight 74.4 kg (164 lb 0.4 oz), SpO2 95 %.   IV Fluids:  IV in place, occlusive dsg intact without redness, IV cath antecubital left and left wrist condition patent and no redness, saline locked   Allergies:  Metoprolol; Cefaclor; and Tetanus toxoid adsorbed  Past Medical History:   has a past medical history of Allergy; Anxiety; Arthritis; Cataract; Chronic diastolic CHF (congestive heart failure) (HCC); Chronic kidney disease; Diabetes mellitus; Gout; Heart murmur; Hyperlipidemia; Hypertension; Peripheral neuropathy; PONV (postoperative nausea and vomiting); and Stroke (HCC).  Past Surgical History:   has a past surgical history that includes Tubal ligation; Hemorrhoid surgery; Neck surgery; Cataract extraction, bilateral; Appendectomy; Eye surgery; Fracture surgery; Spine surgery; and Total knee arthroplasty (Left, 05/16/2017).  Social History:   reports that she has never smoked. She has never used smokeless tobacco. She reports that she does not drink alcohol or use drugs.  Skin: Skin intact. Skin assessed by second RN Jasmina   Patient/Family orientated to room. Information packet given to patient/family. Admission inpatient armband information verified with patient/family to include name and date of birth and placed on patient arm. Side rails up x 2, fall assessment and education completed with patient/family. Patient/family able to verbalize  understanding of risk associated with falls and verbalized understanding to call for assistance before getting out of bed. Call light within reach. Patient/family able to voice and demonstrate understanding of unit orientation instructions.    Will continue to evaluate and treat per MD orders.

## 2017-07-02 NOTE — Progress Notes (Signed)
Pt IV discontinued, catheter intact and telemetry removed Pt discahrge education went over at bedside.  Pt has all belongings including printed prescriptions . Awaiting pt transportation for discharge    Destiny Trickey Elige RadonBradley

## 2017-07-02 NOTE — Progress Notes (Addendum)
Called pharmacy asked if they could change IVPB Levaquin to PO for pt to receive a dose prior to discharge. Pharmacy stated she will look into and adjust as appropriate   MD aware   Sarah Beitler Elige RadonBradley

## 2017-07-03 LAB — CULTURE, BLOOD (ROUTINE X 2)
CULTURE: NO GROWTH
CULTURE: NO GROWTH
SPECIAL REQUESTS: ADEQUATE
Special Requests: ADEQUATE

## 2017-07-04 ENCOUNTER — Telehealth: Payer: Self-pay

## 2017-07-04 LAB — LEGIONELLA PNEUMOPHILA SEROGP 1 UR AG: L. pneumophila Serogp 1 Ur Ag: NEGATIVE

## 2017-07-04 NOTE — Telephone Encounter (Signed)
LMTCB

## 2017-07-07 NOTE — Telephone Encounter (Signed)
D/C 07/02/17 To: home  Spoke with pt and she states that she is doing well but is still tired and weak. She is able to perform all ADL's but does have assistance available if needed. She has no questions or concerns at this time.  Appt scheduled with Dr. Kirtland BouchardK 07/08/17, pt aware.   Transition Care Management Follow-up Telephone Call  How have you been since you were released from the hospital? Good, tired and a little weak   Do you understand why you were in the hospital? yes   Do you understand the discharge instrcutions? yes  Items Reviewed:  Medications reviewed: yes  Allergies reviewed: yes  Dietary changes reviewed: yes  Referrals reviewed: yes   Functional Questionnaire:   Activities of Daily Living (ADLs):   She states they are independent in the following: ambulation, bathing and hygiene, feeding, continence, grooming, toileting and dressing States they require assistance with the following: none   Any transportation issues/concerns?: no   Any patient concerns? no   Confirmed importance and date/time of follow-up visits scheduled: yes   Confirmed with patient if condition begins to worsen call PCP or go to the ER.  Patient was given the Call-a-Nurse line [phone]: yes

## 2017-07-08 ENCOUNTER — Ambulatory Visit (INDEPENDENT_AMBULATORY_CARE_PROVIDER_SITE_OTHER): Payer: Medicare HMO | Admitting: Internal Medicine

## 2017-07-08 ENCOUNTER — Encounter: Payer: Self-pay | Admitting: Internal Medicine

## 2017-07-08 VITALS — BP 128/68 | HR 72 | Temp 97.8°F | Wt 165.6 lb

## 2017-07-08 DIAGNOSIS — E7800 Pure hypercholesterolemia, unspecified: Secondary | ICD-10-CM

## 2017-07-08 DIAGNOSIS — E0821 Diabetes mellitus due to underlying condition with diabetic nephropathy: Secondary | ICD-10-CM

## 2017-07-08 DIAGNOSIS — Z794 Long term (current) use of insulin: Secondary | ICD-10-CM | POA: Diagnosis not present

## 2017-07-08 DIAGNOSIS — I5033 Acute on chronic diastolic (congestive) heart failure: Secondary | ICD-10-CM

## 2017-07-08 DIAGNOSIS — I10 Essential (primary) hypertension: Secondary | ICD-10-CM | POA: Diagnosis not present

## 2017-07-08 DIAGNOSIS — J9601 Acute respiratory failure with hypoxia: Secondary | ICD-10-CM | POA: Diagnosis not present

## 2017-07-08 LAB — CBC WITH DIFFERENTIAL/PLATELET
BASOS PCT: 0.8 % (ref 0.0–3.0)
Basophils Absolute: 0.1 10*3/uL (ref 0.0–0.1)
EOS PCT: 1.9 % (ref 0.0–5.0)
Eosinophils Absolute: 0.1 10*3/uL (ref 0.0–0.7)
HCT: 33.2 % — ABNORMAL LOW (ref 36.0–46.0)
Hemoglobin: 10.6 g/dL — ABNORMAL LOW (ref 12.0–15.0)
LYMPHS ABS: 1.5 10*3/uL (ref 0.7–4.0)
Lymphocytes Relative: 19.8 % (ref 12.0–46.0)
MCHC: 31.9 g/dL (ref 30.0–36.0)
MCV: 79.8 fl (ref 78.0–100.0)
MONO ABS: 0.6 10*3/uL (ref 0.1–1.0)
MONOS PCT: 7.6 % (ref 3.0–12.0)
NEUTROS ABS: 5.2 10*3/uL (ref 1.4–7.7)
NEUTROS PCT: 69.9 % (ref 43.0–77.0)
Platelets: 537 10*3/uL — ABNORMAL HIGH (ref 150.0–400.0)
RBC: 4.16 Mil/uL (ref 3.87–5.11)
RDW: 15.3 % (ref 11.5–15.5)
WBC: 7.4 10*3/uL (ref 4.0–10.5)

## 2017-07-08 LAB — BASIC METABOLIC PANEL
BUN: 39 mg/dL — AB (ref 6–23)
CHLORIDE: 103 meq/L (ref 96–112)
CO2: 23 meq/L (ref 19–32)
Calcium: 9.8 mg/dL (ref 8.4–10.5)
Creatinine, Ser: 3.11 mg/dL — ABNORMAL HIGH (ref 0.40–1.20)
GFR: 19.2 mL/min — ABNORMAL LOW (ref >60.00)
GLUCOSE: 210 mg/dL — AB (ref 70–99)
POTASSIUM: 3.7 meq/L (ref 3.5–5.1)
Sodium: 138 mEq/L (ref 135–145)

## 2017-07-08 LAB — HEMOGLOBIN A1C: HEMOGLOBIN A1C: 6.6 % — AB (ref 4.6–6.5)

## 2017-07-08 MED ORDER — HYDROCODONE-ACETAMINOPHEN 10-325 MG PO TABS: 1.0000 | ORAL_TABLET | Freq: Three times a day (TID) | ORAL | 0 refills | Status: AC | PRN

## 2017-07-08 NOTE — Progress Notes (Signed)
Subjective:    Patient ID: Sarah [name], female    DOB: [birth year], 66 y.o.   MRN: [id number]  HPI  Admit date: 06/27/2017 Discharge date: 07/02/2017   Recommendations for Outpatient Follow-up:  1. Follow up with PCP in 1-2 weeks 2. Please obtain BMP/CBC in one week 3. Follow-up with the cardiologist for stress test.   Brief/Interim Summary: 66 year old female with history of type 2 diabetes, chronic kidney disease is stage IV, anemia, hypertension, hyperlipidemia presented with shortness of breath for about 3 days. Patient had knee replacement surgery about 6 weeks prior to admission. Patient initially required BiPAP and evaluated by the pulmonary critical care service.  Acute respiratory failure with hypoxia likely in the setting of possible CHF and multifocal pneumonia. Hypoxemia improved currently in room air. VQ scan low probability for PE.  Multifocal pneumonia: Discharged with oral Levaquin renally dosed for 5 days.  Acute on chronic diastolic congestive heart failure: Treated with IV Lasix and switched to oral Lasix on discharge. Recommended to follow up with the cardiologist for outpatient stress test. Message sent to cardiology consult team. Recommended low-salt diet and follow up with PCP or cardiologist to repeat lab check. -Repeat echo with normal EF with no wall motion abnormalities.  Acute kidney injury on chronic kidney disease stage IV likely hemodynamically mediated in the setting of diuretics. Serum creatinine level is stable but elevated. Patient is clinically improved. Denied urinary symptoms. Recommended to follow-up with her cardiologist and nephrologist. Patient verbalized understanding.   Discharge Diagnoses:  Principal Problem:   Acute respiratory failure ([place]) Active Problems:   Diabetes mellitus with renal complications (HCC)   Essential hypertension   Acute on chronic diastolic CHF (congestive heart failure) ([place])   HCAP (healthcare-associated  pneumonia)   Acute respiratory failure with hypoxia (HCC)   Pulmonary edema   Cardiac enzymes elevated  66 year old patient who is seen today following a hospital discharge and for transitional care management.  Hospital records reviewed.  Since her discharge she has done well.  Denies any shortness of breath or PND.  She has some mild left lower leg swelling following left total knee replacement surgery, but no right leg edema. She is scheduled for cardiology follow-up and consideration for a nuclear stress test. Denies any exertional chest pain or shortness of breath, but walks with a cane and limited by orthopedic issues. She remains on basal bolus insulin  Lab Results  Component Value Date   HGBA1C 7.5 (H) 03/31/2017   Wt Readings from Last 3 Encounters:  07/08/17 165 lb 9.6 oz (75.1 kg)  07/02/17 164 lb 0.4 oz (74.4 kg)  06/24/17 178 lb 3.2 oz (80.8 kg)   Past Medical History:  Diagnosis Date  . Allergy   . Anxiety    history   . Arthritis   . Cataract    history  . Chronic diastolic CHF (congestive heart failure) ([place])    a. TTE 2016: EF 60-65%, no RWMA, GR1DD, mild AS, trivial AI, mildly dilated LA, PFO unable to be excluded, PASP 38 mmHg  . Chronic kidney disease   . Diabetes mellitus   . Gout   . Heart murmur   . Hyperlipidemia   . Hypertension   . Peripheral neuropathy   . PONV (postoperative nausea and vomiting)   . Stroke [facility])      Social History   Social History  . Marital status: Single    Spouse name: N/A  . Number of children: N/A  . Years of  education: N/A   Occupational History  . Not on file.   Social History Main Topics  . Smoking status: Never Smoker  . Smokeless tobacco: Never Used  . Alcohol use No  . Drug use: No  . Sexual activity: Not on file   Other Topics Concern  . Not on file   Social History Narrative  . No narrative on file    Past Surgical History:  Procedure Laterality Date  . APPENDECTOMY    . CATARACT  EXTRACTION, BILATERAL    . EYE SURGERY    . FRACTURE SURGERY     L foot  . HEMORRHOID SURGERY    . NECK SURGERY    . SPINE SURGERY     cervical spine  . TOTAL KNEE ARTHROPLASTY Left 05/16/2017   Procedure: TOTAL KNEE ARTHROPLASTY;  Surgeon: Dorna Leitz, MD;  Location: [place];  Service: Orthopedics;  Laterality: Left;  . TUBAL LIGATION      Family History  Problem Relation Age of Onset  . Cancer Mother   . Hypertension Mother   . Diabetes Mother   . Heart disease Father     Allergies  Allergen Reactions  . Metoprolol Swelling    SWELLING REACTION UNSPECIFIED   . Cefaclor Hives  . Tetanus Toxoid Adsorbed Swelling    SWELLING REACTION UNSPECIFIED     Current Outpatient Prescriptions on File Prior to Visit  Medication Sig Dispense Refill  . ACCU-CHEK FASTCLIX LANCETS MISC USE TO CHECK BLOOD SUGAR THREE TIMES A DAY BEFORE MEALS AND AT BEDTIME 306 each 0  . ACCU-CHEK SMARTVIEW test strip USE TO CHECK BLOOD SUGAR THREE TIMES A DAY BEFORE MEALS AND AT BEDTIME 400 each 3  . allopurinol (ZYLOPRIM) 100 MG tablet Take 1 tablet (100 mg total) by mouth daily. 30 tablet 0  . amLODipine (NORVASC) 10 MG tablet Take 1 tablet (10 mg total) by mouth daily. 90 tablet 3  . aspirin EC 325 MG tablet Take 1 tablet (325 mg total) by mouth 2 (two) times daily after a meal. Take x 1 month post op to decrease risk of blood clots. 60 tablet 0  . atorvastatin (LIPITOR) 80 MG tablet TAKE 1 TABLET (80 MG TOTAL) BY MOUTH DAILY. 90 tablet 3  . Blood Glucose Monitoring Suppl (ACCU-CHEK NANO SMARTVIEW) w/Device KIT USE AS DIRECTED 1 kit 0  . COLCRYS 0.6 MG tablet Take 0.5 tablets (0.3 mg total) by mouth daily. 10 tablet 5  . cyclobenzaprine (FLEXERIL) 5 MG tablet Take 1 tablet (5 mg total) by mouth 3 (three) times daily as needed for muscle spasms. 30 tablet 0  . furosemide (LASIX) 40 MG tablet Take 1 tablet (40 mg total) by mouth daily. 30 tablet 0  . HYDROcodone-acetaminophen (NORCO) 10-325 MG tablet Take 1  tablet by mouth every 8 (eight) hours as needed. (Patient taking differently: Take 1 tablet by mouth every 8 (eight) hours as needed for moderate pain. ) 30 tablet 0  . insulin aspart (NOVOLOG FLEXPEN) 100 UNIT/ML FlexPen 20 units prior to each meal 15 pen 1  . insulin glargine (LANTUS) 100 UNIT/ML injection 10 units at bedtime 10 mL 6  . Insulin Pen Needle (NOVOFINE) 30G X 8 MM MISC Inject 10 each into the skin as needed. 90 each 3  . lidocaine (LIDODERM) 5 % Place 1 patch onto the skin daily. Remove & Discard patch within 12 hours or as directed by MD    . potassium bicarbonate (KLOR-CON/EF) 25 MEQ disintegrating tablet Take 25 mEq  by mouth daily. In 6 oz of water    . potassium chloride (K-DUR) 10 MEQ tablet Take 2 tablets (20 mEq total) by mouth daily. 60 tablet 0   No current facility-administered medications on file prior to visit.     BP 128/68 (BP Location: Left Arm, Patient Position: Sitting, Cuff Size: Normal)   Pulse 72   Temp 97.8 F (36.6 C) (Oral)   Wt 165 lb 9.6 oz (75.1 kg)   SpO2 98%   BMI 28.43 kg/m     Review of Systems  Constitutional: Positive for activity change, appetite change and fatigue.  HENT: Negative for congestion, dental problem, hearing loss, rhinorrhea, sinus pressure, sore throat and tinnitus.   Eyes: Negative for pain, discharge and visual disturbance.  Respiratory: Negative for cough and shortness of breath.   Cardiovascular: Negative for chest pain, palpitations and leg swelling.  Gastrointestinal: Negative for abdominal distention, abdominal pain, blood in stool, constipation, diarrhea, nausea and vomiting.  Genitourinary: Negative for difficulty urinating, dysuria, flank pain, frequency, hematuria, pelvic pain, urgency, vaginal bleeding, vaginal discharge and vaginal pain.  Musculoskeletal: Positive for arthralgias. Negative for gait problem and joint swelling.       Left knee and bilateral shoulder pain  Skin: Negative for rash.  Neurological:  Negative for dizziness, syncope, speech difficulty, weakness, numbness and headaches.  Hematological: Negative for adenopathy.  Psychiatric/Behavioral: Negative for agitation, behavioral problems and dysphoric mood. The patient is not nervous/anxious.        Objective:   Physical Exam  Constitutional: She is oriented to person, place, and time. She appears well-developed and well-nourished.  Blood pressure 130/64 right arm O2 saturation 98  HENT:  Head: Normocephalic.  Right Ear: External ear normal.  Left Ear: External ear normal.  Mouth/Throat: Oropharynx is clear and moist.  Eyes: Pupils are equal, round, and reactive to light. Conjunctivae and EOM are normal.  Neck: Normal range of motion. Neck supple. No thyromegaly present.  Cardiovascular: Normal rate, regular rhythm and intact distal pulses.   Murmur heard. Grade 3/6 systolic murmur loudest over the primary aortic area  Pulmonary/Chest: Effort normal and breath sounds normal. No respiratory distress. She has no wheezes. She has no rales.  Abdominal: Soft. Bowel sounds are normal. She exhibits no mass. There is no tenderness.  Musculoskeletal: Normal range of motion.  No right leg edema Status post recent left total knee replacement surgery Plus 1 ankle edema  Lymphadenopathy:    She has no cervical adenopathy.  Neurological: She is alert and oriented to person, place, and time.  Skin: Skin is warm and dry. No rash noted.  Psychiatric: She has a normal mood and affect. Her behavior is normal.          Assessment & Plan:   Acute on chronic diastolic heart failure.  Follow-up cardiology.  Consider nuclear stress test.  Low-salt diet recommended Acute on chronic renal failure Diabetes mellitus.  Will continue basal bolus insulin.  We'll review a hemoglobin A1c Essential hypertension.  Stable Chronic shoulder and left knee pain  Cardiology follow-up as planned Check CBC and Bmet  Return here in 4  weeks  [place]

## 2017-07-08 NOTE — Patient Instructions (Signed)
Limit your sodium (Salt) intake  Cardiology follow-up as scheduled   Please check your hemoglobin A1c every 3 months  Report any significant weight gain or swelling of the legs  Return in one month for follow-up

## 2017-07-11 ENCOUNTER — Telehealth: Payer: Self-pay | Admitting: Internal Medicine

## 2017-07-11 NOTE — Telephone Encounter (Signed)
Pt is calling and would like to see if she can get potassium as efferent.

## 2017-07-11 NOTE — Telephone Encounter (Signed)
How does pt want the potassium?

## 2017-07-14 DIAGNOSIS — M25662 Stiffness of left knee, not elsewhere classified: Secondary | ICD-10-CM | POA: Diagnosis not present

## 2017-07-17 NOTE — Progress Notes (Signed)
Cardiology Office Note   Date:  07/18/2017   ID:  CATTALEYA WIEN, DOB 1951-12-13, MRN [id number]  PCP:  Marletta Lor, MD  Cardiologist:  Dr. Marlou Porch    Chief Complaint  Patient presents with  . Hospitalization Follow-up    CHF and PNA, elevated troponin      History of Present Illness: Sarah [name] is a 66 y.o. female who presents for post hospital follow up for CHF and elevated troponin.  She also had PNA.   She has a history of diabetes mellitus type 2, chronic kidney disease stage IV, anemia, hypertension, hyperlipidemia who had recent left knee replacement 6 weeks prior to hospital admit on 06/28/17 with hypoxia, HF and PNA.   This required bipap.  She was diuresed, and PNA was treated.   CKD IV.   D/c'd on po lasix.   We saw her in hospital with troponin 1.50- felt to be demand ischemia.   Plan for outpt myoview   Echo with EF 60-65% nomral wall motion. G2 DD. Mild AS mild MVP, mild regurg.   Today she denies chest pain or SOB.  She is feeling much better.  We discussed how she felt before admit and if she had similar symptoms with increased wt, SOB to call our office and not to wait to see if it would improve.  She has decreased salt.   She is on higher dose of ASA post knee replacement.  She tells me she is on 2 baby asa today.  She has seen PCP back and labs stable with Cr. 3.11.  BUN 39.  GFR is 19.20.  Anemia is stable with hgb 10.6.    DM controlled.    Past Medical History:  Diagnosis Date  . Allergy   . Anxiety    history   . Arthritis   . Cataract    history  . Chronic diastolic CHF (congestive heart failure) ([place])    a. TTE 2016: EF 60-65%, no RWMA, GR1DD, mild AS, trivial AI, mildly dilated LA, PFO unable to be excluded, PASP 38 mmHg  . Chronic kidney disease   . Diabetes mellitus   . Gout   . Heart murmur   . Hyperlipidemia   . Hypertension   . Peripheral neuropathy   . PONV (postoperative nausea and vomiting)   . Stroke [facility])     Past Surgical  History:  Procedure Laterality Date  . APPENDECTOMY    . CATARACT EXTRACTION, BILATERAL    . EYE SURGERY    . FRACTURE SURGERY     L foot  . HEMORRHOID SURGERY    . NECK SURGERY    . SPINE SURGERY     cervical spine  . TOTAL KNEE ARTHROPLASTY Left 05/16/2017   Procedure: TOTAL KNEE ARTHROPLASTY;  Surgeon: Dorna Leitz, MD;  Location: [place];  Service: Orthopedics;  Laterality: Left;  . TUBAL LIGATION       Current Outpatient Prescriptions  Medication Sig Dispense Refill  . ACCU-CHEK FASTCLIX LANCETS MISC USE TO CHECK BLOOD SUGAR THREE TIMES A DAY BEFORE MEALS AND AT BEDTIME 306 each 0  . ACCU-CHEK SMARTVIEW test strip USE TO CHECK BLOOD SUGAR THREE TIMES A DAY BEFORE MEALS AND AT BEDTIME 400 each 3  . allopurinol (ZYLOPRIM) 100 MG tablet Take 1 tablet (100 mg total) by mouth daily. 30 tablet 0  . amLODipine (NORVASC) 10 MG tablet Take 1 tablet (10 mg total) by mouth daily. 90 tablet 3  . atorvastatin (LIPITOR)  80 MG tablet TAKE 1 TABLET (80 MG TOTAL) BY MOUTH DAILY. 90 tablet 3  . Blood Glucose Monitoring Suppl (ACCU-CHEK NANO SMARTVIEW) w/Device KIT USE AS DIRECTED 1 kit 0  . furosemide (LASIX) 40 MG tablet Take 1 tablet (40 mg total) by mouth daily. 30 tablet 0  . HYDROcodone-acetaminophen (NORCO) 10-325 MG tablet Take 1 tablet by mouth every 8 (eight) hours as needed for moderate pain. 90 tablet 0  . insulin aspart (NOVOLOG FLEXPEN) 100 UNIT/ML FlexPen 20 units prior to each meal 15 pen 1  . insulin glargine (LANTUS) 100 UNIT/ML injection 10 units at bedtime 10 mL 6  . Insulin Pen Needle (NOVOFINE) 30G X 8 MM MISC Inject 10 each into the skin as needed. 90 each 3  . lidocaine (LIDODERM) 5 % Place 1 patch onto the skin daily. Remove & Discard patch within 12 hours or as directed by MD    . potassium bicarbonate (KLOR-CON/EF) 25 MEQ disintegrating tablet Take 25 mEq by mouth daily. In 6 oz of water    . aspirin EC 81 MG tablet Take 2 tablets (162 mg total) by mouth daily. 90 tablet 3    No current facility-administered medications for this visit.     Allergies:   Metoprolol; Cefaclor; and Tetanus toxoid adsorbed    Social History:  The patient  reports that she has never smoked. She has never used smokeless tobacco. She reports that she does not drink alcohol or use drugs.   Family History:  The patient's family history includes Cancer in her mother; Diabetes in her mother; Heart disease in her father; Hypertension in her mother.    ROS:  General:no colds or fevers, + weight increase mild Skin:no rashes or ulcers HEENT:no blurred vision, no congestion CV:see HPI PUL:see HPI GI:no diarrhea constipation or melena, no indigestion GU:no hematuria, no dysuria MS:+ Lt knee pain at incision, no claudication Neuro:no syncope, no lightheadedness Endo:+ diabetes up and down glucose. , no thyroid disease  Wt Readings from Last 3 Encounters:  07/18/17 168 lb (76.2 kg)  07/08/17 165 lb 9.6 oz (75.1 kg)  07/02/17 164 lb 0.4 oz (74.4 kg)     PHYSICAL EXAM: VS:  BP 124/62   Pulse 67   Ht _0  (1.626 m)   Wt 168 lb (76.2 kg)   SpO2 99%   BMI 28.84 kg/m  , BMI Body mass index is 28.84 kg/m. General:Pleasant affect, NAD Skin:Warm and dry, brisk capillary refill HEENT:normocephalic, sclera clear, mucus membranes moist Neck:supple, no JVD, no bruits  Heart:S1S2 RRR with 0-3/1 systolic murmur, no gallup, rub or click Lungs:clear without rales, rhonchi, or wheezes RXY:VOPF, non tender, + BS, do not palpate liver spleen or masses Ext:no to trace Lt leg no rt.  lower ext edema, 2+ pedal pulses, 2+ radial pulses Neuro:alert and oriented X 3, MAE, follows commands, + facial symmetry    EKG:  EKG is NOT ordered today.    Recent Labs: 06/27/2017: B Natriuretic Peptide 370.9 07/02/2017: ALT 11 07/08/2017: BUN 39; Creatinine, Ser 3.11; Hemoglobin 10.6; Platelets 537.0; Potassium 3.7; Sodium 138    Lipid Panel    Component Value Date/Time   CHOL 270 (H) 01/09/2015  1140   TRIG 275.0 (H) 01/09/2015 1140   HDL 54.00 01/09/2015 1140   CHOLHDL 5 01/09/2015 1140   VLDL 55.0 (H) 01/09/2015 1140   LDLCALC 207 (H) 07/08/2014 1152   LDLDIRECT 177.0 01/09/2015 1140       Other studies Reviewed: Additional studies/ records that  were reviewed today include: . Echo 07/01/17  Study Conclusions  - Left ventricle: The cavity size was normal. Wall thickness was   normal. Systolic function was normal. The estimated ejection   fraction was in the range of 60% to 65%. Wall motion was normal;   there were no regional wall motion abnormalities. Features are   consistent with a pseudonormal left ventricular filling pattern,   with concomitant abnormal relaxation and increased filling   pressure (grade 2 diastolic dysfunction). - Aortic valve: There was mild stenosis. Peak velocity (S): 244   cm/s. Mean gradient (S): 12 mm Hg. - Mitral valve: Mild prolapse, involving the anterior leaflet.   There was mild regurgitation.  Impressions:  - Compared to the prior study, there has been no significant   interval change.  ASSESSMENT AND PLAN:  1. Elevated troponin with acute event of PNA and Acute HF.   With her risk factors for CAD will plan lexiscan myoview - explained to pt and family and they agree to proceed, this will help direct care and medication.  With Cr. 3.11 cardiac cath would be difficult unless high risk study.  Follow up with me or Dr. Marlou Porch.  2.  Acute diastolic HF in combination with PNA now resolved. euvolemic today   3.  Essential HTN controlled continues meds.  4.  DM-insulin dependent followed by PCP.    Current medicines are reviewed with the patient today.  The patient Has no concerns regarding medicines.  The following changes have been made:  See above Labs/ tests ordered today include:see above  Disposition:   FU:  see above  Signed, Cecilie Kicks, NP  07/18/2017 1:27 PM    [facility] [address],  [place], [place], [place] Phone: [phone]; Fax: [phone]

## 2017-07-18 ENCOUNTER — Ambulatory Visit (INDEPENDENT_AMBULATORY_CARE_PROVIDER_SITE_OTHER): Payer: Medicare HMO | Admitting: Cardiology

## 2017-07-18 ENCOUNTER — Encounter: Payer: Self-pay | Admitting: *Deleted

## 2017-07-18 ENCOUNTER — Encounter: Payer: Self-pay | Admitting: Cardiology

## 2017-07-18 VITALS — BP 124/62 | HR 67 | Ht 64.0 in | Wt 168.0 lb

## 2017-07-18 DIAGNOSIS — E119 Type 2 diabetes mellitus without complications: Secondary | ICD-10-CM | POA: Diagnosis not present

## 2017-07-18 DIAGNOSIS — R748 Abnormal levels of other serum enzymes: Secondary | ICD-10-CM

## 2017-07-18 DIAGNOSIS — R7989 Other specified abnormal findings of blood chemistry: Principal | ICD-10-CM

## 2017-07-18 DIAGNOSIS — Z794 Long term (current) use of insulin: Secondary | ICD-10-CM

## 2017-07-18 DIAGNOSIS — I10 Essential (primary) hypertension: Secondary | ICD-10-CM

## 2017-07-18 DIAGNOSIS — I5033 Acute on chronic diastolic (congestive) heart failure: Secondary | ICD-10-CM

## 2017-07-18 DIAGNOSIS — R778 Other specified abnormalities of plasma proteins: Secondary | ICD-10-CM

## 2017-07-18 MED ORDER — ASPIRIN EC 81 MG PO TBEC: 162.0000 mg | DELAYED_RELEASE_TABLET | Freq: Every day | ORAL | 3 refills | Status: AC

## 2017-07-18 NOTE — Patient Instructions (Signed)
Medication Instructions:  Your physician recommends that you continue on your current medications as directed. Please refer to the Current Medication list given to you today.   Labwork: -None  Testing/Procedures: Your physician has requested that you have a lexiscan myoview. For further information please visit [email or web address]. Please follow instruction sheet, as given.      Follow-Up: Your physician recommends that you keep your scheduled follow-up appointment with Dr. Anne FuSkains in August.   Any Other Special Instructions Will Be Listed Below (If Applicable).     If you need a refill on your cardiac medications before your next appointment, please call your pharmacy.

## 2017-07-21 ENCOUNTER — Telehealth (HOSPITAL_COMMUNITY): Payer: Self-pay | Admitting: *Deleted

## 2017-07-21 NOTE — Telephone Encounter (Signed)
Patient given detailed instructions per Myocardial Perfusion Study Information Sheet for the test on 07/25/17 at 7:45. Patient notified to arrive 15 minutes early and that it is imperative to arrive on time for appointment to keep from having the test rescheduled.  If you need to cancel or reschedule your appointment, please call the office within 24 hours of your appointment. . Patient verbalized understanding.Daneil DolinSharon S Brooks

## 2017-07-24 ENCOUNTER — Other Ambulatory Visit: Payer: Self-pay | Admitting: Internal Medicine

## 2017-07-24 MED ORDER — POTASSIUM BICARBONATE 25 MEQ PO TBEF: 25.0000 meq | EFFERVESCENT_TABLET | Freq: Every day | ORAL | 2 refills | Status: AC

## 2017-07-25 ENCOUNTER — Encounter (INDEPENDENT_AMBULATORY_CARE_PROVIDER_SITE_OTHER): Payer: Self-pay

## 2017-07-25 ENCOUNTER — Ambulatory Visit (HOSPITAL_COMMUNITY): Payer: Medicare HMO | Attending: Internal Medicine

## 2017-07-25 DIAGNOSIS — Z8673 Personal history of transient ischemic attack (TIA), and cerebral infarction without residual deficits: Secondary | ICD-10-CM | POA: Diagnosis not present

## 2017-07-25 DIAGNOSIS — R7989 Other specified abnormal findings of blood chemistry: Secondary | ICD-10-CM | POA: Diagnosis not present

## 2017-07-25 DIAGNOSIS — E109 Type 1 diabetes mellitus without complications: Secondary | ICD-10-CM | POA: Diagnosis not present

## 2017-07-25 DIAGNOSIS — R748 Abnormal levels of other serum enzymes: Secondary | ICD-10-CM | POA: Diagnosis not present

## 2017-07-25 DIAGNOSIS — I10 Essential (primary) hypertension: Secondary | ICD-10-CM | POA: Insufficient documentation

## 2017-07-25 DIAGNOSIS — R0602 Shortness of breath: Secondary | ICD-10-CM | POA: Diagnosis not present

## 2017-07-25 DIAGNOSIS — I5033 Acute on chronic diastolic (congestive) heart failure: Secondary | ICD-10-CM | POA: Diagnosis not present

## 2017-07-25 DIAGNOSIS — R9439 Abnormal result of other cardiovascular function study: Secondary | ICD-10-CM | POA: Diagnosis not present

## 2017-07-25 DIAGNOSIS — R778 Other specified abnormalities of plasma proteins: Secondary | ICD-10-CM

## 2017-07-25 LAB — MYOCARDIAL PERFUSION IMAGING
CHL CUP NUCLEAR SDS: 3
CHL CUP NUCLEAR SRS: 17
CHL CUP RESTING HR STRESS: 70 {beats}/min
LV dias vol: 115 mL (ref 46–106)
LV sys vol: 38 mL
NUC STRESS TID: 1.11
Peak HR: 83 {beats}/min
RATE: 0.32
SSS: 20

## 2017-07-25 MED ORDER — TECHNETIUM TC 99M TETROFOSMIN IV KIT
10.8000 | PACK | Freq: Once | INTRAVENOUS | Status: AC | PRN
  Administered 2017-07-25: 10.8 via INTRAVENOUS
  Filled 2017-07-25: qty 11

## 2017-07-25 MED ORDER — REGADENOSON 0.4 MG/5ML IV SOLN
0.4000 mg | Freq: Once | INTRAVENOUS | Status: AC
  Administered 2017-07-25: 0.4 mg via INTRAVENOUS

## 2017-07-25 MED ORDER — TECHNETIUM TC 99M TETROFOSMIN IV KIT
32.2000 | PACK | Freq: Once | INTRAVENOUS | Status: AC | PRN
  Administered 2017-07-25: 32.2 via INTRAVENOUS
  Filled 2017-07-25: qty 33

## 2017-07-28 ENCOUNTER — Telehealth: Payer: Self-pay | Admitting: Cardiology

## 2017-07-28 MED ORDER — ISOSORBIDE MONONITRATE ER 30 MG PO TB24: 30.0000 mg | ORAL_TABLET | Freq: Every day | ORAL | 3 refills | Status: AC

## 2017-07-28 NOTE — Telephone Encounter (Signed)
Pt has been notified Myoview results by phone with verbal understanding. Ptadvised to start Imdur 30 mg tablet daily per Nada BoozerLaura Ingold, NP and Dr. Anne FuSkains. Pt agreeable to plan of care. [facility] [address] Confirmed pt's appt with Dr. Anne FuSkains 08/11/17 @ 2 pm. Pt thanked me for my call and talking to her today.

## 2017-07-28 NOTE — Telephone Encounter (Signed)
New message   Pt is calling for testing results

## 2017-07-28 NOTE — Telephone Encounter (Signed)
-----   Message from Leone BrandLaura R Ingold, NP sent at 07/27/2017  7:56 AM EDT ----- Add imdur 30 mg to medications.  Please let her know she has some coronary artery disease and we are treating with the imdur.  If she has any chest pain she should go to ER.  Otherwise keep follow up with Dr. Anne FuSkains -

## 2017-07-28 NOTE — Telephone Encounter (Signed)
F/u Message  Pt call to f/u up on results. Please call back to discuss

## 2017-08-04 ENCOUNTER — Encounter: Payer: Self-pay | Admitting: Internal Medicine

## 2017-08-04 ENCOUNTER — Telehealth: Payer: Self-pay

## 2017-08-04 NOTE — Telephone Encounter (Signed)
Per [facility] pt has passed. DOD Aug 07, 2017, app 1900pm  They are sending DC for you to sign.  Dr. Linda Hedges  [facility] Call Center Patient Name: Sarah [name] Gender: Female DOB: 1951-12-11 Age: 66 Y 12 D Return Phone Number: [phone] (Primary) City/State/Zip: [place] [address] Client [facility] Night - Client Client Site [facility] - Night Physician Derryl Harbor - MD Who Is Calling Patient / Member / Family / Caregiver Call Type Triage / Clinical Caller Name Mortimer Fries EMT-P Relationship To Patient Other Return Phone Number [phone] (Primary) Chief Complaint DEATH - has occurred or is imminent or pending Reason for Call Symptomatic / Request for Health Information Initial Comment [facility] with a pt that has passed Away Nurse Assessment Nurse: Lucianne Lei, RN, Lanora Manis Date/Time (Eastern Time): 08-07-2017 7:07:37 PM Confirm and document reason for call. If symptomatic, describe symptoms. ---Mortimer Fries EMT-P is calling to report the patient has passes away and wants to know if the PCP will sign the death certificate.

## 2017-08-05 ENCOUNTER — Ambulatory Visit: Payer: Medicare HMO | Admitting: Internal Medicine

## 2017-08-11 ENCOUNTER — Ambulatory Visit: Payer: Medicare HMO | Admitting: Cardiology

## 2017-08-16 DIAGNOSIS — 419620001 Death: Secondary | SNOMED CT | POA: Diagnosis not present

## 2017-08-16 DEATH — deceased

## 2017-08-25 ENCOUNTER — Ambulatory Visit: Payer: Medicare HMO | Admitting: Internal Medicine

## 2018-12-24 IMAGING — NM NM PULMONARY VENT & PERF
16 series · 16 of 16 positions shown · non-contrast
Comparison: Chest radiograph June 30, 2017

CLINICAL DATA: Shortness of breath and chest pain

EXAM:
NUCLEAR MEDICINE VENTILATION - PERFUSION LUNG SCAN
VIEWS:
Anterior, posterior, left lateral, right lateral, RPO, LPO, RAO, LAO
-ventilation and perfusion
RADIOPHARMACEUTICALS:  31.3 mCi Yechnetium-11m DTPA aerosol
inhalation and 4.2 mCi Yechnetium-11m MAA IV

[Series 1: ant/post vent · 4.14mm/px · 1 of 1 slices shown (1 of 2)]
[im 1/1]
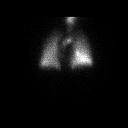

[Series 1: ant/post vent · 4.14mm/px · 1 of 1 slices shown (2 of 2)]
[im 1/1]
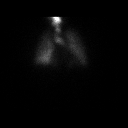

[Series 2: lao/rpo vent · 4.14mm/px · 1 of 1 slices shown (1 of 2)]
[im 1/1]
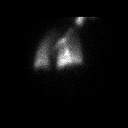

[Series 2: lao/rpo vent · 4.14mm/px · 1 of 1 slices shown (2 of 2)]
[im 1/1]
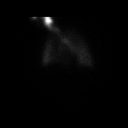

[Series 3: lpo/rao vent · 4.14mm/px · 1 of 1 slices shown (1 of 2)]
[im 1/1]
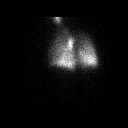

[Series 3: lpo/rao vent · 4.14mm/px · 1 of 1 slices shown (2 of 2)]
[im 1/1]
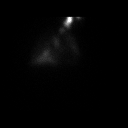

[Series 4: lt lat/rt lat vent · 4.14mm/px · 1 of 1 slices shown (1 of 2)]
[im 1/1]
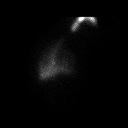

[Series 4: lt lat/rt lat vent · 4.14mm/px · 1 of 1 slices shown (2 of 2)]
[im 1/1]
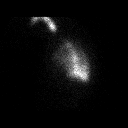

[Series 5: lt lat/rt lat perf · 4.14mm/px · 1 of 1 slices shown (1 of 2)]
[im 1/1]
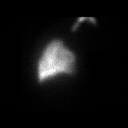

[Series 5: lt lat/rt lat perf · 4.14mm/px · 1 of 1 slices shown (2 of 2)]
[im 1/1]
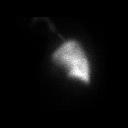

[Series 6: lpo/rao perf · 4.14mm/px · 1 of 1 slices shown (1 of 2)]
[im 1/1]
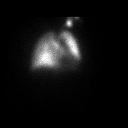

[Series 6: lpo/rao perf · 4.14mm/px · 1 of 1 slices shown (2 of 2)]
[im 1/1]
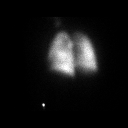

[Series 7: ant/post perf · 4.14mm/px · 1 of 1 slices shown (1 of 2)]
[im 1/1]
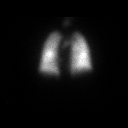

[Series 7: ant/post perf · 4.14mm/px · 1 of 1 slices shown (2 of 2)]
[im 1/1]
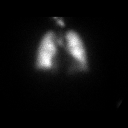

[Series 8: lao/rpo perf · 4.14mm/px · 1 of 1 slices shown (1 of 2)]
[im 1/1]
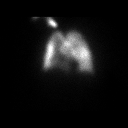

[Series 8: lao/rpo perf · 4.14mm/px · 1 of 1 slices shown (2 of 2)]
[im 1/1]
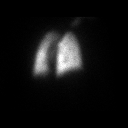

[16 of 16 positions shown; findings below may reference images not displayed]

FINDINGS: Ventilation: There is homogeneous and symmetric radiotracer uptake
bilaterally on the ventilation study. No ventilation defects are
evident.

Perfusion: There is homogeneous and symmetric radiotracer uptake
bilaterally on the perfusion study. No perfusion defects are
evident.
IMPRESSION: No appreciable ventilation or perfusion defects. This study
constitutes a very low probability of pulmonary embolus.
